# Patient Record
Sex: Female | Born: 1940
Health system: Southern US, Community
[De-identification: ages and names within clinical notes are randomized; demographics above are authoritative.]

## PROBLEM LIST (undated history)

## (undated) DIAGNOSIS — M810 Age-related osteoporosis without current pathological fracture: Secondary | ICD-10-CM

## (undated) DIAGNOSIS — E785 Hyperlipidemia, unspecified: Secondary | ICD-10-CM

## (undated) DIAGNOSIS — I10 Essential (primary) hypertension: Secondary | ICD-10-CM

## (undated) DIAGNOSIS — H539 Unspecified visual disturbance: Secondary | ICD-10-CM

## (undated) DIAGNOSIS — K219 Gastro-esophageal reflux disease without esophagitis: Secondary | ICD-10-CM

## (undated) DIAGNOSIS — R519 Headache, unspecified: Secondary | ICD-10-CM

## (undated) DIAGNOSIS — R51 Headache: Secondary | ICD-10-CM

## (undated) DIAGNOSIS — K5792 Diverticulitis of intestine, part unspecified, without perforation or abscess without bleeding: Secondary | ICD-10-CM

## (undated) DIAGNOSIS — M1990 Unspecified osteoarthritis, unspecified site: Secondary | ICD-10-CM

## (undated) DIAGNOSIS — R9431 Abnormal electrocardiogram [ECG] [EKG]: Secondary | ICD-10-CM

## (undated) HISTORY — DX: Abnormal electrocardiogram (ECG) (EKG): R94.31

## (undated) HISTORY — PX: COLON SURGERY: SHX602

## (undated) HISTORY — PX: EYE SURGERY: SHX253

## (undated) HISTORY — PX: TONSILLECTOMY: SUR1361

## (undated) HISTORY — DX: Hyperlipidemia, unspecified: E78.5

## (undated) HISTORY — PX: SHOULDER ARTHROSCOPY: SHX128

## (undated) HISTORY — DX: Essential (primary) hypertension: I10

## (undated) HISTORY — DX: Gastro-esophageal reflux disease without esophagitis: K21.9

## (undated) HISTORY — PX: APPENDECTOMY: SHX54

## (undated) HISTORY — DX: Unspecified visual disturbance: H53.9

## (undated) HISTORY — DX: Age-related osteoporosis without current pathological fracture: M81.0

---

## 1998-05-28 ENCOUNTER — Emergency Department (HOSPITAL_COMMUNITY): Admission: EM | Admit: 1998-05-28 | Discharge: 1998-05-28 | Payer: Self-pay | Admitting: Internal Medicine

## 2001-02-05 ENCOUNTER — Ambulatory Visit (HOSPITAL_BASED_OUTPATIENT_CLINIC_OR_DEPARTMENT_OTHER): Admission: RE | Admit: 2001-02-05 | Discharge: 2001-02-05 | Payer: Self-pay | Admitting: Orthopedic Surgery

## 2001-05-08 ENCOUNTER — Other Ambulatory Visit: Admission: RE | Admit: 2001-05-08 | Discharge: 2001-05-08 | Payer: Self-pay | Admitting: *Deleted

## 2001-09-27 ENCOUNTER — Encounter (INDEPENDENT_AMBULATORY_CARE_PROVIDER_SITE_OTHER): Payer: Self-pay | Admitting: Specialist

## 2001-09-27 ENCOUNTER — Ambulatory Visit (HOSPITAL_COMMUNITY): Admission: RE | Admit: 2001-09-27 | Discharge: 2001-09-27 | Payer: Self-pay | Admitting: *Deleted

## 2002-05-14 ENCOUNTER — Other Ambulatory Visit: Admission: RE | Admit: 2002-05-14 | Discharge: 2002-05-14 | Payer: Self-pay | Admitting: *Deleted

## 2003-07-22 ENCOUNTER — Other Ambulatory Visit: Admission: RE | Admit: 2003-07-22 | Discharge: 2003-07-22 | Payer: Self-pay | Admitting: *Deleted

## 2004-04-19 ENCOUNTER — Emergency Department (HOSPITAL_COMMUNITY): Admission: EM | Admit: 2004-04-19 | Discharge: 2004-04-19 | Payer: Self-pay | Admitting: Emergency Medicine

## 2004-05-01 ENCOUNTER — Encounter: Admission: RE | Admit: 2004-05-01 | Discharge: 2004-05-01 | Payer: Self-pay | Admitting: Neurology

## 2007-11-08 ENCOUNTER — Encounter (INDEPENDENT_AMBULATORY_CARE_PROVIDER_SITE_OTHER): Payer: Self-pay | Admitting: *Deleted

## 2007-11-08 ENCOUNTER — Ambulatory Visit (HOSPITAL_COMMUNITY): Admission: RE | Admit: 2007-11-08 | Discharge: 2007-11-08 | Payer: Self-pay | Admitting: *Deleted

## 2007-12-04 ENCOUNTER — Encounter (INDEPENDENT_AMBULATORY_CARE_PROVIDER_SITE_OTHER): Payer: Self-pay | Admitting: *Deleted

## 2007-12-04 ENCOUNTER — Ambulatory Visit (HOSPITAL_COMMUNITY): Admission: RE | Admit: 2007-12-04 | Discharge: 2007-12-04 | Payer: Self-pay | Admitting: *Deleted

## 2008-01-01 ENCOUNTER — Ambulatory Visit (HOSPITAL_COMMUNITY): Admission: RE | Admit: 2008-01-01 | Discharge: 2008-01-01 | Payer: Self-pay | Admitting: *Deleted

## 2008-01-01 ENCOUNTER — Encounter (INDEPENDENT_AMBULATORY_CARE_PROVIDER_SITE_OTHER): Payer: Self-pay | Admitting: *Deleted

## 2008-04-29 ENCOUNTER — Encounter: Admission: RE | Admit: 2008-04-29 | Discharge: 2008-04-29 | Payer: Self-pay | Admitting: Internal Medicine

## 2008-09-29 ENCOUNTER — Encounter (INDEPENDENT_AMBULATORY_CARE_PROVIDER_SITE_OTHER): Payer: Self-pay | Admitting: *Deleted

## 2008-09-29 ENCOUNTER — Ambulatory Visit (HOSPITAL_COMMUNITY): Admission: RE | Admit: 2008-09-29 | Discharge: 2008-09-29 | Payer: Self-pay | Admitting: *Deleted

## 2008-10-31 ENCOUNTER — Encounter (INDEPENDENT_AMBULATORY_CARE_PROVIDER_SITE_OTHER): Payer: Self-pay | Admitting: General Surgery

## 2008-10-31 ENCOUNTER — Inpatient Hospital Stay (HOSPITAL_COMMUNITY): Admission: RE | Admit: 2008-10-31 | Discharge: 2008-11-05 | Payer: Self-pay | Admitting: General Surgery

## 2009-11-15 ENCOUNTER — Encounter: Admission: RE | Admit: 2009-11-15 | Discharge: 2009-11-15 | Payer: Self-pay | Admitting: Specialist

## 2010-06-14 ENCOUNTER — Other Ambulatory Visit: Payer: Self-pay | Admitting: Registered Nurse

## 2010-06-14 ENCOUNTER — Other Ambulatory Visit (HOSPITAL_COMMUNITY)
Admission: RE | Admit: 2010-06-14 | Discharge: 2010-06-14 | Disposition: A | Discharge status: Home / Self Care | Payer: Medicare Other | Source: Ambulatory Visit | Attending: Internal Medicine | Admitting: Internal Medicine

## 2010-06-14 DIAGNOSIS — Z01419 Encounter for gynecological examination (general) (routine) without abnormal findings: Secondary | ICD-10-CM | POA: Insufficient documentation

## 2010-06-19 LAB — CBC
MCHC: 34.1 g/dL (ref 30.0–36.0)
MCV: 90.5 fL (ref 78.0–100.0)
Platelets: 174 10*3/uL (ref 150–400)
RBC: 4.21 MIL/uL (ref 3.87–5.11)
RBC: 4.82 MIL/uL (ref 3.87–5.11)
RDW: 13.8 % (ref 11.5–15.5)
WBC: 8.2 10*3/uL (ref 4.0–10.5)

## 2010-06-19 LAB — DIFFERENTIAL
Eosinophils Absolute: 0.5 10*3/uL (ref 0.0–0.7)
Eosinophils Relative: 6 % — ABNORMAL HIGH (ref 0–5)
Lymphs Abs: 1.6 10*3/uL (ref 0.7–4.0)
Monocytes Absolute: 0.7 10*3/uL (ref 0.1–1.0)

## 2010-06-19 LAB — URINALYSIS, ROUTINE W REFLEX MICROSCOPIC
Nitrite: NEGATIVE
Specific Gravity, Urine: 1.005 (ref 1.005–1.030)
Urobilinogen, UA: 0.2 mg/dL (ref 0.0–1.0)

## 2010-06-19 LAB — BASIC METABOLIC PANEL
BUN: 5 mg/dL — ABNORMAL LOW (ref 6–23)
CO2: 29 mEq/L (ref 19–32)
Calcium: 8.4 mg/dL (ref 8.4–10.5)
Creatinine, Ser: 0.88 mg/dL (ref 0.4–1.2)
GFR calc Af Amer: >60 mL/min (ref >60)

## 2010-06-19 LAB — COMPREHENSIVE METABOLIC PANEL
ALT: 21 U/L (ref 0–35)
AST: 21 U/L (ref 0–37)
Albumin: 4 g/dL (ref 3.5–5.2)
CO2: 30 mEq/L (ref 19–32)
Calcium: 9.8 mg/dL (ref 8.4–10.5)
Chloride: 103 mEq/L (ref 96–112)
GFR calc Af Amer: 52 mL/min — ABNORMAL LOW (ref >60)
GFR calc non Af Amer: 43 mL/min — ABNORMAL LOW (ref >60)
Sodium: 140 mEq/L (ref 135–145)
Total Bilirubin: 0.7 mg/dL (ref 0.3–1.2)

## 2010-07-27 NOTE — Op Note (Signed)
[name], [name]              ACCOUNT NO.:  [id number]   MEDICAL RECORD NO.:  [id number]          PATIENT TYPE:  AMB   LOCATION:  ENDO                         FACILITY:  [facility]   PHYSICIAN:  Georgiana Spinner, M.D.    DATE OF BIRTH:  [birth year]   DATE OF PROCEDURE:  DATE OF DISCHARGE:                               OPERATIVE REPORT   PROCEDURE:  Colonoscopy with biopsy.   INDICATIONS:  Previous colon polyp in the cecum that was removed and  eradicated with the ERBE argon photocoagulator.  This was a look back to  assess for any residual polyp tissue.   ANESTHESIA:  Fentanyl 100 mcg and Versed 10 mg.   PROCEDURE:  With the patient mildly sedated in the left lateral  decubitus position, the Pentax videoscopic colonoscope was inserted in  the rectum and passed under direct vision to the sigmoid colon which was  very tortuous and I could not pass the scope past this point, therefore  it was withdrawn and the Pentax videoscopic pediatric colonoscope was  inserted into the rectum and passed again through a very tortuous tight  sigmoid colon to reach the cecum with pressure applied to the patient's  abdomen.  The patient rolled to her back and subsequently to her right  side.  Cecum was identified by ileocecal valve and appendiceal orifice,  both of which were photographed.  In the cecum was a small ulcer  approximately 5 mm in size with a clean white base surrounding  erythematous tissue that did not look polypoid to me, but I elected to  biopsy this cold to make sure there was not microscopic recurrence.  There was a small amount of bleeding which I elected to inject with  epinephrine 2 mL.  Also in the cecum near this was a small flat polyp  that was photographed and removed using hot biopsy forceps technique,  setting of 20/150 blended current.  Once I was satisfied that I had a  good look at the cecum and I felt there was probably no residual polyp  at the site of the ulcer, small  new polyp seen.  The colonoscope was  then slowly withdrawn taking circumferential views of the colonic  mucosa.  The prep was good.  We stopped only in the rectum where another  small polyp was seen.  It was flat and photographed and removed using  again hot biopsy forceps technique setting of 20/150 blended current.  The endoscope was withdrawn.  I elected not to do retroflexed view again  at this point.  The patient's vital signs and pulse oximeter remained  stable.  The patient tolerated the procedure well without apparent long-  term complication other than bleeding from the biopsy site, which was  controlled with epinephrine.   IMPRESSION:  Probably no recurrence of polyp at previous polypectomy  site, small ulcer remains with surrounding erythema that was friable and  bled with biopsy.  This was again injected to stop bleeding and two  small polyps were seen, one near this in the cecum and clearly to be  benign and probably 4  mm in size, and a second polyp in the rectum which  was all approximately 4 mm in size, flat and benign appearing, also  removed with hot biopsy forceps technique.   PLAN:  Await biopsies.  The patient will call me for results and follow-  up with me as needed as an outpatient.           ______________________________  Georgiana Spinner, M.D.    GMO/MEDQ  D:  01/01/2008  T:  01/01/2008  Job:  [id number]

## 2010-07-27 NOTE — Op Note (Signed)
NAME:  [name], ENNEKING              ACCOUNT NO.:  [id number]   MEDICAL RECORD NO.:  [id number]          PATIENT TYPE:  AMB   LOCATION:  ENDO                         FACILITY:  [facility]   PHYSICIAN:  Georgiana Spinner, M.D.    DATE OF BIRTH:  [birth year]   DATE OF PROCEDURE:  12/04/2007  DATE OF DISCHARGE:                               OPERATIVE REPORT   PROCEDURE:  Colonoscopy with polypectomy and eradication of tumor.   ANESTHESIA:  Fentanyl 100 mcg, Versed 7 mg.   INDICATIONS:  The patient had a previous polyp in the cecum that I had  to remove and eradicate with argon photocoagulation 4 weeks ago.  This  was for follow-up to make sure there was adequate eradication.  Subsequently the Pentax videoscopic pediatric colonoscope was inserted  in the rectum, passed under direct vision through a diverticular filled  tortuous sigmoid colon to subsequently reach the cecum identified by  ileocecal valve and appendiceal orifice, the latter of which was  photographed.  In the cecum was once again seen a polyp.  This was  photographed and it was removed in two pieces by snare cautery technique  setting of 20/150 blended current.  With that I felt that I probably had  removed the full tumor but because of the regrowth, I elected to use the  Erbe argon photocoagulator to coagulate around the polypectomy site  which I did.  Photographs taken.  Once this was completed, the endoscope  was withdrawn taking circumferential views of the remaining colonic  mucosa stopping only in the rectum and withdrawing the endoscope.  The  patient's vital signs, pulse oximeter remained stable.  The patient  tolerated the procedure well without apparent complications.   FINDINGS:  Polyp of cecum removed and eradicated once again.  Diverticulosis of sigmoid colon moderately severe.   PLAN:  Await biopsy report of the tissue retrieved.           ______________________________  Georgiana Spinner, M.D.     GMO/MEDQ  D:   12/04/2007  T:  12/05/2007  Job:  [id number]

## 2010-07-27 NOTE — Op Note (Signed)
[name], PLANCK              ACCOUNT NO.:  [id number]   MEDICAL RECORD NO.:  [id number]          PATIENT TYPE:  INP   LOCATION:  0001                         FACILITY:  [facility]   PHYSICIAN:  Angelia Mould. Derrell Lolling, M.D.DATE OF BIRTH:  13-Oct-1940   DATE OF PROCEDURE:  10/31/2008  DATE OF DISCHARGE:                               OPERATIVE REPORT   PREOPERATIVE DIAGNOSIS:  Recurrent villous adenoma of the cecum.   POSTOPERATIVE DIAGNOSIS:  Recurrent villous adenoma of the cecum.   OPERATION PERFORMED:  Laparoscopic-assisted right colectomy.   SURGEON:  Dr. Claud Kelp.   FIRST ASSISTANT:  Dr. Chevis Pretty.   OPERATIVE INDICATIONS:  This is a relatively healthy 70 year old  Caucasian female with recurrent villous adenoma of the cecum.  One year  ago she developed abdominal cramps, loose stools and blood and mucus in  the stools.  Dr. Virginia Rochester perform three separate colonoscopies to resect a  villous adenoma of the cecum.  After her procedure he had everything  resected and the patient's symptoms resolved.  All the pathology reports  showed villous adenoma, but no evidence of high-grade dysplasia.  She  had about 8 months without any symptoms and then the cramping and mucus  and blood recurred.  She had a colonoscopy on September 29, 2008.  He found a  tortuous sigmoid colon, but the only lesions that were found were in the  cecum where a recurrent villous adenoma was present.  He biopsied this  and that showed villous adenoma but no dysplasia,.  She was referred for  resection.  She is been counseled as an outpatient.  She has undergone a  bowel prep at home.  She is brought to operating room electively.   OPERATIVE FINDINGS:  The patient had a reasonable bowel prep, although  there was a little bit of liquid sticky stool in the right colon.  There  was a villous adenoma in the cecum.  It was very localized and probably  about 1.5 to 2 cm in size at most.  There was no sign of any invasive  cancer anywhere.  We resected about 5 inches of terminal ileum and  resected the right colon up to just beyond the hepatic flexure.   OPERATIVE TECHNIQUE:  Following induction of general endotracheal  anesthesia intravenous antibiotics were given.  A Foley catheter was  inserted.  The patient's abdomen was prepped and draped in sterile  fashion.  The patient was identified as the correct patient and correct  procedure.  A 0.5% Marcaine with epinephrine was used as a local  infiltration anesthetic.  A vertical incision was made at the superior  rim of the umbilicus.  The fascia was incised in the midline and the  abdominal cavity entered under direct vision.  An 11-mm Hassan trocar  was inserted and secured with a pursestring suture of 0-Vicryl.  Pneumoperitoneum was created.  Video cam was inserted with visualization  of findings as described above.  A 5-mm trocar placed was placed in the  lower midline, a 5-mm trocar was placed in the left midabdomen and a 5-  mm trocar  in the left upper quadrant.   Using the harmonic scalpel and blunt dissection, we divided the lateral  peritoneal attachments, mobilizing the terminal ileum, cecum and right  colon and hepatic flexure.  We did this slowly in small steps until we  had the cecum and right colon mobilized all the way to the midline.  We  did visualize the duodenum.  We repositioned the patient and took down  the hepatic flexure somewhat.  Once we had everything completely  mobilized to the midline we irrigated the area and found no bleeding.   We released the pneumoperitoneum.  We made a short incision that  extended from just below the umbilicus around to the right and just  above the umbilicus incorporating the trocar site.  The fascia was  incised in the midline.  We opened the fascia about 7 cm or so.  We  inserted a protractor wound protector.  We delivered the terminal ileum,  cecum and right colon into the wound.  There were a  couple of adhesions  that we took down and then we could see the terminal ileum, the right  colon and the hepatic flexure coming out into the wound.  We also had  some of the transverse colon out of the wound.  We did not palpate any  abnormality.  We transected the terminal ileum using a GIA stapling  device.  We transected just distal to the hepatic flexure with a GIA  stapling device.  Mesenteric vessels were controlled either with the  LigaSure device or larger ones were clamped, divided and ligated with 2-  0 silk ties.   I took the specimen to the side table and opened it up completely.  I  then visualized the localized villous adenoma as described above.  I was  satisfied that we had identified the lesion.   I changed my gown and gloves.  There was no bleeding.  Anastomosis was  created between the terminal ileum and the right transverse colon using  a GIA stapling device.  The defect in the bowel wall was closed with TA-  60 stapling device.  A few extra sutures of 3-0 silk were placed to  reinforce the staple line at critical points.  The bowel was pink and  healthy and bled easily and the anastomosis looked watertight.  We  changed our gloves and instruments one more time.  The mesentery was  closed with interrupted sutures of 2-0 and 3-0 silk.  We irrigated out  the tissues and then dropped the colon back into the abdominal cavity.  We irrigated the abdominal cavity with about 2 L of saline.  The  irrigation fluid became clear.  We closed the midline fascia with a  running suture of #1 PDS and skin staples.  We reinsufflated the  abdomen.  We inserted the 5 mm scope and looked around the pelvis in the  upper abdomen and subphrenic spaces.  We irrigated the subphrenic space  and pelvis just a little bit to get all the fluid out.  We looked at the  anastomosis and there did not seem to be any bleeding.  We then removed  all the trocars under direct vision.  There was no  bleeding from trocar  sites.  Pneumoperitoneum was released.  The trocar sites were closed  with Steri-Strips.  Clean bandages were placed and the patient was taken  to recovery in stable condition.   ESTIMATED BLOOD LOSS:  About 75 mL.  COMPLICATIONS:  None.   COUNTS:  Sponge, needle and instruments counts were correct.      Angelia Mould. Derrell Lolling, M.D.  Electronically Signed     HMI/MEDQ  D:  10/31/2008  T:  10/31/2008  Job:  [id number]   cc:   Soyla Murphy. Renne Crigler, M.D.  Fax: [phone]   Georgiana Spinner, M.D.  Fax: [phone]

## 2010-07-27 NOTE — Op Note (Signed)
[name], [name]              ACCOUNT NO.:  [id number]   MEDICAL RECORD NO.:  [id number]          PATIENT TYPE:  AMB   LOCATION:  ENDO                         FACILITY:  [facility]   PHYSICIAN:  Georgiana Spinner, M.D.    DATE OF BIRTH:  1940-08-20   DATE OF PROCEDURE:  09/29/2008  DATE OF DISCHARGE:                               OPERATIVE REPORT   PROCEDURE:  Colonoscopy.   INDICATIONS:  Rectal bleeding, colon polyps.   ANESTHESIA:  Fentanyl 100 mcg, Versed 10 mg.   PROCEDURE:  With the patient mildly sedated in the left lateral  decubitus position, the Pentax videoscopic pediatric colonoscope was  inserted in the rectum and passed through a tortuous sigmoid colon that  was tight, and with pressure applied, we were able to reach the cecum  identified by ileocecal valve and appendiceal orifice.  In the cecum was  recurrent polyp that I previously removed and eradicated with the ERBE  argon photocoagulator from about a year ago and had had follow-up  colonoscopy which showed that it was negative.  We did biopsies at this  time and photographed.  The area was in the cecum.  From this point, the  colonoscope was slowly withdrawn taking circumferential views of the  remaining colonic mucosa, stopping only in the rectum which appeared  normal on direct, and I could not retroflex at this point.  The  endoscope was then withdrawn.  The patient's vital signs and pulse  oximeter remained stable.  The patient tolerated the procedure well  without apparent complication.   FINDINGS:  Polyp of her cecum, biopsied.   PLAN:  I think with recurrence in 1 year to this extent that this  patient should have a cecectomy done, and I am going to suggest a  referral to surgery to that end.  The patient had what I thought was a  good resection a year ago only to recur within a year's time.           ______________________________  Georgiana Spinner, M.D.     GMO/MEDQ  D:  09/29/2008  T:  09/29/2008   Job:  [id number]

## 2010-07-27 NOTE — Op Note (Signed)
[name], [name]              ACCOUNT NO.:  [id number]   MEDICAL RECORD NO.:  [id number]          PATIENT TYPE:  AMB   LOCATION:  ENDO                         FACILITY:  [facility]   PHYSICIAN:  Georgiana Spinner, M.D.    DATE OF BIRTH:  [birth year]   DATE OF PROCEDURE:  11/08/2007  DATE OF DISCHARGE:                               OPERATIVE REPORT   PROCEDURE:  Colonoscopy with polypectomy.  The eradication of tumor  biopsy.   PROCEDURE INDICATIONS:  Rectal bleeding, diarrhea.   ANESTHESIA:  Fentanyl 125 mcg, Versed 11 mg.   PROCEDURE:  With the patient mildly sedated in the left lateral  decubitus position, the Pentax videoscopic colonoscope was inserted in  the rectum and passed under direct vision to the sigmoid but no further.  and the endoscope had to be removed subsequently.  The Pentax  videoscopic pediatric colonoscope was inserted in the rectum and passed  under direct vision with pressure applied to reach the cecum, identified  by ileocecal valve and appendiceal orifice, the latter of which was  photographed, first, and, in the cecum, was a large polyp that was  photographed, and we subsequently removed using it snare cautery  technique and subsequently ERBE argon photocoagulator to eradicate the  remainder of the polyp I could.  I believe I got all the tissue, but  some on the far side of the polyp may not have come off, but I could not  see any at the termination of the procedure. and I felt that this was a  good first start at this point.  So subsequently from this point, the  colonoscope was then slowly withdrawn taking circumferential views of  the remaining colonic mucosa, stopping in the sigmoid colon where  inflammatory changes were seen, photographed, and biopsied until we  reached the rectum which appeared normal on direct and showed  hemorrhoids on retroflexed view.  The endoscope was straightened and  withdrawn.  The patient's vital signs, pulse oximeter remained  stable.  The patient tolerated the procedure well without apparent complications.   FINDINGS:  Large polyp of cecum removed by snare cautery technique and  ERBE argon photocoagulation, internal hemorrhoids and inflammatory  changes of sigmoid and near diverticulosis changes.   PLAN:  Await biopsy report.  The patient will call me for results and  follow up with me as an outpatient.           ______________________________  Georgiana Spinner, M.D.     GMO/MEDQ  D:  11/08/2007  T:  11/08/2007  Job:  [id number]

## 2010-07-30 NOTE — Procedures (Signed)
[facility]  Patient:    [name], [name] Visit Number: [id number] MRN: [id number]          Service Type: END Location: ENDO Attending Physician:  Sabino Gasser Dictated by:   Sabino Gasser, M.D. Proc. Date: 09/27/01 Admit Date:  09/27/2001 Discharge Date: 09/27/2001                             Procedure Report  PROCEDURE:  Upper endoscopy.  INDICATIONS:  GERD.  ANESTHESIA:  Demerol 50 mg, Versed 5 mg.  DESCRIPTION OF PROCEDURE:  With the patient mildly sedated in the left lateral decubitus position, the Olympus videoscopic endoscope inserted in the mouth and passed under direct vision through the esophagus (which appeared normal). Then into the stomach, fundus, antrum and body which was visualized.  In the antrum erythema was seen, photographed and biopsied.  We then entered into duodenal bulb and second portion of duodenum -- appeared normal.  From this point the endoscope was slowly withdrawn, taking circumferential views of the duodenal mucosa, until the endoscope had been pulled back and the stomach placed in retroflexion to view the stomach from below.  The  endoscope was then straightened and withdrawn, taking circumferential views of the remaining gastric and esophageal mucosa, stopping in the fundus. There a small polyp was seen and photographed, and a biopsy taken.  The remainder of the gastric and esophageal mucosa appeared unremarkable. The patients vital signs and pulse oximetry remained stable.  The patient tolerated the procedure well without apparent complications.  FINDINGS: 1. Antral erythema, biopsied. 2. Fundic polyp, biopsied.  PLAN:  Await biopsy report.  The patient will call me for results and follow-up with me as an outpatient.  Proceed to colonoscopy as planned. Dictated by:   Sabino Gasser, M.D. Attending Physician:  Sabino Gasser DD:  09/27/01 TD:  10/01/01 Job: [id number]

## 2010-07-30 NOTE — Op Note (Signed)
[name]  Patient:    [name], [name] Visit Number: [id number] MRN: [id number]          Service Type: DSU Location: [facility] Attending Physician:  Twana First Dictated by:   Elana Alm Thurston Hole, M.D. Proc. Date: 02/05/01 Admit Date:  02/05/2001                             Operative Report  PREOPERATIVE DIAGNOSES: 1. Left shoulder superior labrum anterior and posterior tear. 2. Left shoulder partial rotator cuff tear. 3. Left shoulder paralabral cyst with impingement.  POSTOPERATIVE DIAGNOSES: 1. Left shoulder superior labrum anterior and posterior tear. 2. Left shoulder partial rotator cuff tear. 3. Left shoulder paralabral cyst with impingement.  PROCEDURES: 1. Left shoulder examination under anesthesia followed by arthroscopic    superior labrum anterior and posterior tear debridement. 2. Left shoulder subacromial decompression. 3. Left shoulder paralabral cyst aspiration and cortisone injection.  SURGEON:  Elana Alm. Thurston Hole, M.D.  ASSISTANT:  Julien Girt, P.A.  ANESTHESIA:  General.  OPERATIVE TIME:  45 minutes.  COMPLICATIONS:  None.  INDICATION FOR PROCEDURE:  [name] a 70 year old woman who has had significant left shoulder pain for the past six to eight months, increasing in nature, with skin and subcutaneous tissue and MRI documenting an SLAP tear with impingement and a paralabral cyst.  She is now to undergo arthroscopy with either cyst excision versus aspiration.  DESCRIPTION OF PROCEDURE:  [name] was brought to the operating room on February 05, 2001, after a block had been placedin the holding room.  She was placed on the operating table in the supine position.  After being placed under general anesthesia, her left shoulder was examined under anesthesia. She had full range of motion, and her shoulder was stable to ligamentous exam.  Using fluoroscopic control and an 18-gauge spinal needle, the  paralabral cyst was aspirated through the superior aspect of the shoulder under fluoroscopic control, and gelatinous cystic material was removed, approximately 3-4 cc, and a combination of1 cc of 40 mg of Depo-Medrol and 1 cc of 0.25% Marcaine was placed in the cyst area.  After this was done, the shoulder and arm were prepped using sterile Betadine and draped using sterile technique.  Originally the arthroscopy was performed through a posterior arthroscopic portal, and the arthroscope with the pump attached was placed into an anterior portal and the arthroscopic probe was placed.  On initial inspection the articular cartilage ad the glenohumeral joint were intact.  The anterior labrum superiorly showed partial tearing.  This was partially debrided.  The biceps tendon anchor was hypermobile but did not appear to be traumatically torn, and thus did not feel that a repair of this hypermobile biceps tendon anchor was necessary.  The debridement of the SLAP tear was carried out 25-30%.  The posterior labrum was intact.  The biceps tendon itself was extremely diminutive and actually adherent and almost noted to be part of the superior and medial aspect of the supraspinatus.  I attempted first to tease this apart; however, I felt that this would cause actually more damage to the shoulder area than to leave it as such contiguous with the rotatorcuff itself.  The rotator cuff had partial tearing, 25-30% of the supraspinatus.  This was debrided.  The rest of the cuff was found to be intactwith good attachment points to the greater tuberosity and lesser tuberosity.  The inferior capsular recess was  free of pathology.  After this wasdone, the subacromial space was entered and a lateral arthroscopic portal was made.  A large amount of bursitis was resected.  Underneath this the rotator cuff was found to be intact. Subacromial decompression was carried out, removing 6-8 mm of the undersurface of the  anterior, anterior lateral, and anterior medial acromion, and a CA ligament release carried out.  The [facility] joint was not disturbed.  After this was done, the shoulder could be brought through a full range of motion with no impingement of therotator cuff.  At this point it was felt that all pathology had been satisfactorily addressed.  The arthroscopic portals were closed with 3-0 nylon suture, injected with 0.25% Marcaine.  Sterile dressings and a sling applied and the patient awakened and taken to the recovery room in stable condition.  FOLLOW-UP CARE:  [name] will be followed as an outpatient on Vicodin and on [place] NX for pain.  Begin early physical therapy.  See her back in the office in a week for sutures out and follow-up. Dictated by:   Elana Alm Thurston Hole, M.D. Attending Physician: Twana First DD:  02/05/09 TD:  02/05/01 Job: [id number]

## 2010-07-30 NOTE — Procedures (Signed)
[facility]  Patient:    [name], [name] Visit Number: [id number] MRN: [id number]          Service Type: END Location: ENDO Attending Physician:  Sabino Gasser Dictated by:   Sabino Gasser, M.D. Proc. Date: 09/27/01 Admit Date:  09/27/2001 Discharge Date: 09/27/2001                             Procedure Report  PROCEDURE:  Colonoscopy.  INDICATIONS:  Colon cancer screening.  ANESTHESIA:  Demerol 20, Versed 2 mg additionally.  DESCRIPTION OF PROCEDURE:  With the patient mildly sedated in the left lateral decubitus position, the Olympus videoscopic colonoscope was inserted into the rectum and passed under direct vision through a tortuous, very tight sigmoid colon to the cecum.  Cecum identified by the ileocecal valve and appendiceal orifice, both of which were photographed.  The prep was good.  From this point, the colonoscope was slowly withdrawn, taking circumferential views of the entire colonic mucosa, stopping only in the rectum which appeared normal on direct and retroflexed views.  The endoscope was straightened and withdrawn.  The patients vital signs and pulse oximeter remained stable.  The patient tolerated the procedure well without apparent complications.  FINDINGS:  Diverticulosis with tortuosity of the sigmoid colon.  Otherwise an unremarkable examination.  PLAN:  See endoscopy note for further details. Dictated by:   Sabino Gasser, M.D. Attending Physician:  Sabino Gasser DD:  09/27/01 TD:  10/01/01 Job: [id number]

## 2010-12-17 ENCOUNTER — Other Ambulatory Visit: Payer: Self-pay | Admitting: Internal Medicine

## 2010-12-17 DIAGNOSIS — Z1231 Encounter for screening mammogram for malignant neoplasm of breast: Secondary | ICD-10-CM

## 2010-12-30 ENCOUNTER — Other Ambulatory Visit: Payer: Self-pay | Admitting: Internal Medicine

## 2010-12-30 ENCOUNTER — Ambulatory Visit
Admission: RE | Admit: 2010-12-30 | Discharge: 2010-12-30 | Disposition: A | Discharge status: Home / Self Care | Payer: Medicare Other | Source: Ambulatory Visit | Attending: Internal Medicine | Admitting: Internal Medicine

## 2010-12-30 DIAGNOSIS — Z1231 Encounter for screening mammogram for malignant neoplasm of breast: Secondary | ICD-10-CM

## 2010-12-30 DIAGNOSIS — R928 Other abnormal and inconclusive findings on diagnostic imaging of breast: Secondary | ICD-10-CM

## 2011-01-14 ENCOUNTER — Other Ambulatory Visit: Payer: Self-pay | Admitting: Internal Medicine

## 2011-01-14 ENCOUNTER — Ambulatory Visit
Admission: RE | Admit: 2011-01-14 | Discharge: 2011-01-14 | Disposition: A | Discharge status: Home / Self Care | Payer: Medicare Other | Source: Ambulatory Visit | Attending: Internal Medicine | Admitting: Internal Medicine

## 2011-01-14 DIAGNOSIS — N63 Unspecified lump in unspecified breast: Secondary | ICD-10-CM

## 2011-01-14 DIAGNOSIS — R928 Other abnormal and inconclusive findings on diagnostic imaging of breast: Secondary | ICD-10-CM

## 2011-03-02 ENCOUNTER — Ambulatory Visit
Admission: RE | Admit: 2011-03-02 | Discharge: 2011-03-02 | Disposition: A | Discharge status: Home / Self Care | Payer: Medicare Other | Source: Ambulatory Visit | Attending: Internal Medicine | Admitting: Internal Medicine

## 2011-03-02 ENCOUNTER — Other Ambulatory Visit: Payer: Self-pay | Admitting: Internal Medicine

## 2011-03-02 DIAGNOSIS — S0990XA Unspecified injury of head, initial encounter: Secondary | ICD-10-CM

## 2011-06-06 DIAGNOSIS — H6120 Impacted cerumen, unspecified ear: Secondary | ICD-10-CM | POA: Diagnosis not present

## 2011-06-06 DIAGNOSIS — H902 Conductive hearing loss, unspecified: Secondary | ICD-10-CM | POA: Diagnosis not present

## 2011-06-28 DIAGNOSIS — K219 Gastro-esophageal reflux disease without esophagitis: Secondary | ICD-10-CM | POA: Diagnosis not present

## 2011-06-28 DIAGNOSIS — I10 Essential (primary) hypertension: Secondary | ICD-10-CM | POA: Diagnosis not present

## 2011-06-28 DIAGNOSIS — Z79899 Other long term (current) drug therapy: Secondary | ICD-10-CM | POA: Diagnosis not present

## 2011-07-01 DIAGNOSIS — G43909 Migraine, unspecified, not intractable, without status migrainosus: Secondary | ICD-10-CM | POA: Diagnosis not present

## 2011-07-01 DIAGNOSIS — E7800 Pure hypercholesterolemia, unspecified: Secondary | ICD-10-CM | POA: Diagnosis not present

## 2011-07-01 DIAGNOSIS — J309 Allergic rhinitis, unspecified: Secondary | ICD-10-CM | POA: Diagnosis not present

## 2011-07-01 DIAGNOSIS — M899 Disorder of bone, unspecified: Secondary | ICD-10-CM | POA: Diagnosis not present

## 2011-07-01 DIAGNOSIS — M949 Disorder of cartilage, unspecified: Secondary | ICD-10-CM | POA: Diagnosis not present

## 2011-08-23 DIAGNOSIS — G43909 Migraine, unspecified, not intractable, without status migrainosus: Secondary | ICD-10-CM | POA: Diagnosis not present

## 2011-08-23 DIAGNOSIS — B399 Histoplasmosis, unspecified: Secondary | ICD-10-CM | POA: Diagnosis not present

## 2011-08-23 DIAGNOSIS — H33009 Unspecified retinal detachment with retinal break, unspecified eye: Secondary | ICD-10-CM | POA: Diagnosis not present

## 2011-08-23 DIAGNOSIS — H32 Chorioretinal disorders in diseases classified elsewhere: Secondary | ICD-10-CM | POA: Diagnosis not present

## 2011-12-19 ENCOUNTER — Other Ambulatory Visit: Payer: Self-pay | Admitting: Internal Medicine

## 2011-12-19 DIAGNOSIS — Z1231 Encounter for screening mammogram for malignant neoplasm of breast: Secondary | ICD-10-CM

## 2011-12-19 DIAGNOSIS — Z803 Family history of malignant neoplasm of breast: Secondary | ICD-10-CM

## 2011-12-26 DIAGNOSIS — H1089 Other conjunctivitis: Secondary | ICD-10-CM | POA: Diagnosis not present

## 2011-12-26 DIAGNOSIS — Z23 Encounter for immunization: Secondary | ICD-10-CM | POA: Diagnosis not present

## 2012-01-10 DIAGNOSIS — H6120 Impacted cerumen, unspecified ear: Secondary | ICD-10-CM | POA: Diagnosis not present

## 2012-01-10 DIAGNOSIS — H902 Conductive hearing loss, unspecified: Secondary | ICD-10-CM | POA: Diagnosis not present

## 2012-01-17 ENCOUNTER — Ambulatory Visit
Admission: RE | Admit: 2012-01-17 | Discharge: 2012-01-17 | Disposition: A | Discharge status: Home / Self Care | Payer: Medicare Other | Source: Ambulatory Visit | Attending: Internal Medicine | Admitting: Internal Medicine

## 2012-01-17 DIAGNOSIS — Z1231 Encounter for screening mammogram for malignant neoplasm of breast: Secondary | ICD-10-CM

## 2012-01-17 DIAGNOSIS — Z803 Family history of malignant neoplasm of breast: Secondary | ICD-10-CM

## 2012-06-05 DIAGNOSIS — H33009 Unspecified retinal detachment with retinal break, unspecified eye: Secondary | ICD-10-CM | POA: Diagnosis not present

## 2012-06-05 DIAGNOSIS — H32 Chorioretinal disorders in diseases classified elsewhere: Secondary | ICD-10-CM | POA: Diagnosis not present

## 2012-06-05 DIAGNOSIS — B399 Histoplasmosis, unspecified: Secondary | ICD-10-CM | POA: Diagnosis not present

## 2012-07-03 DIAGNOSIS — M899 Disorder of bone, unspecified: Secondary | ICD-10-CM | POA: Diagnosis not present

## 2012-07-03 DIAGNOSIS — I10 Essential (primary) hypertension: Secondary | ICD-10-CM | POA: Diagnosis not present

## 2012-07-03 DIAGNOSIS — Z0000 Encounter for general adult medical examination without abnormal findings: Secondary | ICD-10-CM | POA: Diagnosis not present

## 2012-07-03 DIAGNOSIS — E7800 Pure hypercholesterolemia, unspecified: Secondary | ICD-10-CM | POA: Diagnosis not present

## 2012-07-03 DIAGNOSIS — Z79899 Other long term (current) drug therapy: Secondary | ICD-10-CM | POA: Diagnosis not present

## 2012-07-10 DIAGNOSIS — M159 Polyosteoarthritis, unspecified: Secondary | ICD-10-CM | POA: Diagnosis not present

## 2012-07-10 DIAGNOSIS — E7800 Pure hypercholesterolemia, unspecified: Secondary | ICD-10-CM | POA: Diagnosis not present

## 2012-07-10 DIAGNOSIS — G43909 Migraine, unspecified, not intractable, without status migrainosus: Secondary | ICD-10-CM | POA: Diagnosis not present

## 2012-07-10 DIAGNOSIS — K219 Gastro-esophageal reflux disease without esophagitis: Secondary | ICD-10-CM | POA: Diagnosis not present

## 2012-12-06 DIAGNOSIS — L821 Other seborrheic keratosis: Secondary | ICD-10-CM | POA: Diagnosis not present

## 2012-12-06 DIAGNOSIS — D2370 Other benign neoplasm of skin of unspecified lower limb, including hip: Secondary | ICD-10-CM | POA: Diagnosis not present

## 2012-12-06 DIAGNOSIS — D485 Neoplasm of uncertain behavior of skin: Secondary | ICD-10-CM | POA: Diagnosis not present

## 2012-12-25 ENCOUNTER — Other Ambulatory Visit: Payer: Self-pay

## 2012-12-25 DIAGNOSIS — Z1231 Encounter for screening mammogram for malignant neoplasm of breast: Secondary | ICD-10-CM

## 2012-12-28 DIAGNOSIS — Z23 Encounter for immunization: Secondary | ICD-10-CM | POA: Diagnosis not present

## 2013-01-21 DIAGNOSIS — E2839 Other primary ovarian failure: Secondary | ICD-10-CM | POA: Diagnosis not present

## 2013-01-28 ENCOUNTER — Ambulatory Visit
Admission: RE | Admit: 2013-01-28 | Discharge: 2013-01-28 | Disposition: A | Discharge status: Home / Self Care | Payer: Medicare Other | Source: Ambulatory Visit

## 2013-01-28 ENCOUNTER — Ambulatory Visit: Payer: Medicare Other

## 2013-01-28 DIAGNOSIS — Z1231 Encounter for screening mammogram for malignant neoplasm of breast: Secondary | ICD-10-CM

## 2013-03-15 DIAGNOSIS — G43909 Migraine, unspecified, not intractable, without status migrainosus: Secondary | ICD-10-CM | POA: Diagnosis not present

## 2013-03-15 DIAGNOSIS — H33009 Unspecified retinal detachment with retinal break, unspecified eye: Secondary | ICD-10-CM | POA: Diagnosis not present

## 2013-03-15 DIAGNOSIS — H01009 Unspecified blepharitis unspecified eye, unspecified eyelid: Secondary | ICD-10-CM | POA: Diagnosis not present

## 2013-03-15 DIAGNOSIS — B399 Histoplasmosis, unspecified: Secondary | ICD-10-CM | POA: Diagnosis not present

## 2013-03-25 DIAGNOSIS — H6120 Impacted cerumen, unspecified ear: Secondary | ICD-10-CM | POA: Diagnosis not present

## 2013-03-25 DIAGNOSIS — H902 Conductive hearing loss, unspecified: Secondary | ICD-10-CM | POA: Diagnosis not present

## 2013-03-25 DIAGNOSIS — H608X9 Other otitis externa, unspecified ear: Secondary | ICD-10-CM | POA: Diagnosis not present

## 2013-06-11 DIAGNOSIS — Z86010 Personal history of colonic polyps: Secondary | ICD-10-CM | POA: Diagnosis not present

## 2013-06-11 DIAGNOSIS — Z09 Encounter for follow-up examination after completed treatment for conditions other than malignant neoplasm: Secondary | ICD-10-CM | POA: Diagnosis not present

## 2013-06-11 DIAGNOSIS — K5730 Diverticulosis of large intestine without perforation or abscess without bleeding: Secondary | ICD-10-CM | POA: Diagnosis not present

## 2013-07-18 DIAGNOSIS — Z23 Encounter for immunization: Secondary | ICD-10-CM | POA: Diagnosis not present

## 2013-07-18 DIAGNOSIS — K219 Gastro-esophageal reflux disease without esophagitis: Secondary | ICD-10-CM | POA: Diagnosis not present

## 2013-07-18 DIAGNOSIS — Z0000 Encounter for general adult medical examination without abnormal findings: Secondary | ICD-10-CM | POA: Diagnosis not present

## 2013-07-18 DIAGNOSIS — E7800 Pure hypercholesterolemia, unspecified: Secondary | ICD-10-CM | POA: Diagnosis not present

## 2013-07-18 DIAGNOSIS — I10 Essential (primary) hypertension: Secondary | ICD-10-CM | POA: Diagnosis not present

## 2013-07-23 DIAGNOSIS — R5383 Other fatigue: Secondary | ICD-10-CM | POA: Diagnosis not present

## 2013-07-23 DIAGNOSIS — H608X9 Other otitis externa, unspecified ear: Secondary | ICD-10-CM | POA: Diagnosis not present

## 2013-07-23 DIAGNOSIS — R5381 Other malaise: Secondary | ICD-10-CM | POA: Diagnosis not present

## 2013-07-23 DIAGNOSIS — G43909 Migraine, unspecified, not intractable, without status migrainosus: Secondary | ICD-10-CM | POA: Diagnosis not present

## 2013-07-23 DIAGNOSIS — K219 Gastro-esophageal reflux disease without esophagitis: Secondary | ICD-10-CM | POA: Diagnosis not present

## 2013-07-29 DIAGNOSIS — I69998 Other sequelae following unspecified cerebrovascular disease: Secondary | ICD-10-CM | POA: Diagnosis not present

## 2013-08-08 ENCOUNTER — Ambulatory Visit (INDEPENDENT_AMBULATORY_CARE_PROVIDER_SITE_OTHER): Payer: Medicare Other | Admitting: Cardiovascular Disease

## 2013-08-08 ENCOUNTER — Encounter: Payer: Self-pay | Admitting: Cardiovascular Disease

## 2013-08-08 ENCOUNTER — Ambulatory Visit: Payer: Medicare Other | Admitting: Cardiovascular Disease

## 2013-08-08 VITALS — BP 130/86 | HR 58 | Ht 65.5 in | Wt 166.1 lb

## 2013-08-08 DIAGNOSIS — R9431 Abnormal electrocardiogram [ECG] [EKG]: Secondary | ICD-10-CM

## 2013-08-08 DIAGNOSIS — I10 Essential (primary) hypertension: Secondary | ICD-10-CM | POA: Diagnosis not present

## 2013-08-08 DIAGNOSIS — E785 Hyperlipidemia, unspecified: Secondary | ICD-10-CM | POA: Diagnosis not present

## 2013-08-08 DIAGNOSIS — I517 Cardiomegaly: Secondary | ICD-10-CM

## 2013-08-08 NOTE — Progress Notes (Signed)
08/08/2013 [name]   08-18-1940  [id number]  Primary Physician Horatio Pel, MD Primary Cardiologist: Lorretta Harp MD Renae Gloss   HPI:  [name] is a 73 year old 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 She was referred by Dr. Dr. Deland Pretty at [facility]  medical for evaluation of generalized fatigue and an abnormal EKG. Her cardiovascular risk factor profile is remarkable for hypertension, hyperlipidemia and family history. Her father had heart-related issues in the 38s and a brother did have a stent before he died of lung cancer in his 58s. She has never had a heart attack or stroke. She does complain of dyspnea but denies chest pain. She was referred for an abnormal EKG with ST segment changes.   Current Outpatient Prescriptions  Medication Sig Dispense Refill  . aspirin EC 81 MG tablet Take 81 mg by mouth daily.      Marland Kitchen atorvastatin (LIPITOR) 20 MG tablet Take 20 mg by mouth daily.      . Calcium Carbonate-Vit D-Min (CALCIUM 1200 PO) Take 1 tablet by mouth daily.      . cholecalciferol (VITAMIN D) 1000 UNITS tablet Take 1,000 Units by mouth daily.      . meclizine (ANTIVERT) 25 MG tablet Take 25 mg by mouth as needed for dizziness.      . Multiple Vitamins-Minerals (ICAPS MV PO) Take 1 tablet by mouth 2 (two) times daily.      . propranolol ER (INDERAL LA) 60 MG 24 hr capsule Take 1 capsule by mouth daily.      . RESTASIS 0.05 % ophthalmic emulsion Place 1 drop into both eyes 2 (two) times daily.      . rizatriptan (MAXALT-MLT) 10 MG disintegrating tablet Take 1 tablet by mouth as needed.      . triamterene-hydrochlorothiazide (MAXZIDE-25) 37.5-25 MG per tablet Take 1 tablet by mouth 3 (three) times a week.       No current facility-administered medications for this visit.    Allergies  Allergen  Reactions  . Antihistamines, Diphenhydramine-Type   . Codeine     History   Social History  . Marital Status: Widowed    Spouse Name: N/A    Number of Children: N/A  . Years of Education: N/A   Occupational History  . Not on file.   Social History Main Topics  . Smoking status: Never Smoker   . Smokeless tobacco: Never Used  . Alcohol Use: No  . Drug Use: No  . Sexual Activity: Not on file   Other Topics Concern  . Not on file   Social History Narrative  . No narrative on file     Review of Systems: General: negative for chills, fever, night sweats or weight changes.  Cardiovascular: negative for chest pain, dyspnea on exertion, edema, orthopnea, palpitations, paroxysmal nocturnal dyspnea or shortness of breath Dermatological: negative for rash Respiratory: negative for cough or wheezing Urologic: negative for hematuria Abdominal: negative for nausea, vomiting, diarrhea, bright red blood per rectum, melena, or hematemesis Neurologic: negative for visual changes, syncope, or dizziness All other systems reviewed and are otherwise negative except as noted above.    Blood pressure 130/86, pulse 58, height 5' 5.5" (1.664 m), weight 166 lb 1.6 oz (75.342 kg).  General appearance: alert and no distress Neck: no adenopathy, no carotid bruit, no JVD,  supple, symmetrical, trachea midline and thyroid not enlarged, symmetric, no tenderness/mass/nodules Lungs: clear to auscultation bilaterally Heart: regular rate and rhythm, S1, S2 normal, no murmur, click, rub or gallop Extremities: extremities normal, atraumatic, no cyanosis or edema and 2+ pedal pulses bilaterally  EKG sinus bradycardia at 58 with LVH voltage and ST segment changes consistent with repolarization.  ASSESSMENT AND PLAN:   Abnormal EKG Patient was referred by Dr. Deland Pretty for evaluation of an abnormal EKG. She has LVH voltage with repolarization changes. She does have a history of hypertension. Her  cardiac risk factor otherwise is only remarkable for family history with a father who had a myocardial infarction in his 53s and a brother who had a stent. Otherwise there is factors include hypertension and hyperlipidemia. She is completely asymptomatic other than generalized fatigue which is nonspecific. I'm going to get a 2-D echocardiogram to evaluate LV function and LVH.  Essential hypertension Controlled on current medications  Hyperlipidemia Not on statin therapy. Followed by her PCP      Lorretta Harp MD [facility] 08/08/2013 8:36 AM

## 2013-08-08 NOTE — Assessment & Plan Note (Signed)
Controlled on current medications 

## 2013-08-08 NOTE — Assessment & Plan Note (Signed)
Patient was referred by Dr. Deland Pretty for evaluation of an abnormal EKG. She has LVH voltage with repolarization changes. She does have a history of hypertension. Her cardiac risk factor otherwise is only remarkable for family history with a father who had a myocardial infarction in his 25s and a brother who had a stent. Otherwise there is factors include hypertension and hyperlipidemia. She is completely asymptomatic other than generalized fatigue which is nonspecific. I'm going to get a 2-D echocardiogram to evaluate LV function and LVH.

## 2013-08-08 NOTE — Patient Instructions (Signed)
Your physician has requested that you have an echocardiogram. Echocardiography is a painless test that uses sound waves to create images of your heart. It provides your doctor with information about the size and shape of your heart and how well your heart's chambers and valves are working. This procedure takes approximately one hour. There are no restrictions for this procedure.  Dr Gwenlyn Found recommends that you follow-up with him on an as needed basis.

## 2013-08-08 NOTE — Assessment & Plan Note (Signed)
Not on statin therapy. Followed by her PCP

## 2013-08-09 ENCOUNTER — Ambulatory Visit: Payer: Medicare Other | Admitting: Cardiovascular Disease

## 2013-08-09 DIAGNOSIS — R42 Dizziness and giddiness: Secondary | ICD-10-CM | POA: Diagnosis not present

## 2013-08-09 DIAGNOSIS — I69998 Other sequelae following unspecified cerebrovascular disease: Secondary | ICD-10-CM | POA: Diagnosis not present

## 2013-08-14 ENCOUNTER — Ambulatory Visit (HOSPITAL_COMMUNITY)
Admission: RE | Admit: 2013-08-14 | Discharge: 2013-08-14 | Disposition: A | Discharge status: Home / Self Care | Payer: Medicare Other | Source: Ambulatory Visit | Attending: Internal Medicine | Admitting: Internal Medicine

## 2013-08-14 DIAGNOSIS — R9431 Abnormal electrocardiogram [ECG] [EKG]: Secondary | ICD-10-CM | POA: Insufficient documentation

## 2013-08-14 DIAGNOSIS — I517 Cardiomegaly: Secondary | ICD-10-CM

## 2013-08-14 DIAGNOSIS — I359 Nonrheumatic aortic valve disorder, unspecified: Secondary | ICD-10-CM | POA: Diagnosis not present

## 2013-08-14 NOTE — Progress Notes (Signed)
2D Echo Performed 08/14/2013    Damien Batty, RCS  

## 2013-08-20 ENCOUNTER — Encounter: Payer: Self-pay | Admitting: *Deleted

## 2013-08-26 ENCOUNTER — Telehealth: Payer: Self-pay | Admitting: Cardiovascular Disease

## 2013-08-26 NOTE — Telephone Encounter (Signed)
Is wanting to know the results of her Echo .Marland KitchenPlease Call

## 2013-08-26 NOTE — Telephone Encounter (Signed)
RN returned call and informed patient of echo results. Also informed that letter had been mailed to patient on 6/9.   Patient reports that patient with MRN [id number] had received her letter regarding echo in her letter regarding stress test that was documented to be mailed on same day.   Patient requests that letter with echo results be mailed to her. RN verified patient address and letter mailed.

## 2013-09-20 DIAGNOSIS — H6120 Impacted cerumen, unspecified ear: Secondary | ICD-10-CM | POA: Diagnosis not present

## 2013-09-20 DIAGNOSIS — H902 Conductive hearing loss, unspecified: Secondary | ICD-10-CM | POA: Diagnosis not present

## 2013-11-15 DIAGNOSIS — H33009 Unspecified retinal detachment with retinal break, unspecified eye: Secondary | ICD-10-CM | POA: Diagnosis not present

## 2013-11-15 DIAGNOSIS — H01009 Unspecified blepharitis unspecified eye, unspecified eyelid: Secondary | ICD-10-CM | POA: Diagnosis not present

## 2013-11-15 DIAGNOSIS — B399 Histoplasmosis, unspecified: Secondary | ICD-10-CM | POA: Diagnosis not present

## 2013-11-15 DIAGNOSIS — H32 Chorioretinal disorders in diseases classified elsewhere: Secondary | ICD-10-CM | POA: Diagnosis not present

## 2013-12-25 DIAGNOSIS — Z8489 Family history of other specified conditions: Secondary | ICD-10-CM | POA: Diagnosis not present

## 2013-12-25 DIAGNOSIS — H35372 Puckering of macula, left eye: Secondary | ICD-10-CM | POA: Diagnosis not present

## 2013-12-25 DIAGNOSIS — Z885 Allergy status to narcotic agent status: Secondary | ICD-10-CM | POA: Diagnosis not present

## 2013-12-25 DIAGNOSIS — Z7982 Long term (current) use of aspirin: Secondary | ICD-10-CM | POA: Diagnosis not present

## 2013-12-25 DIAGNOSIS — H3581 Retinal edema: Secondary | ICD-10-CM | POA: Diagnosis not present

## 2013-12-25 DIAGNOSIS — Z823 Family history of stroke: Secondary | ICD-10-CM | POA: Diagnosis not present

## 2013-12-25 DIAGNOSIS — Z809 Family history of malignant neoplasm, unspecified: Secondary | ICD-10-CM | POA: Diagnosis not present

## 2013-12-25 DIAGNOSIS — Z888 Allergy status to other drugs, medicaments and biological substances status: Secondary | ICD-10-CM | POA: Diagnosis not present

## 2013-12-25 DIAGNOSIS — Z8249 Family history of ischemic heart disease and other diseases of the circulatory system: Secondary | ICD-10-CM | POA: Diagnosis not present

## 2013-12-26 DIAGNOSIS — H35372 Puckering of macula, left eye: Secondary | ICD-10-CM | POA: Diagnosis not present

## 2013-12-31 ENCOUNTER — Other Ambulatory Visit: Payer: Self-pay

## 2013-12-31 DIAGNOSIS — Z1231 Encounter for screening mammogram for malignant neoplasm of breast: Secondary | ICD-10-CM

## 2014-01-07 DIAGNOSIS — H35372 Puckering of macula, left eye: Secondary | ICD-10-CM | POA: Diagnosis not present

## 2014-01-10 DIAGNOSIS — Z23 Encounter for immunization: Secondary | ICD-10-CM | POA: Diagnosis not present

## 2014-01-24 DIAGNOSIS — H9202 Otalgia, left ear: Secondary | ICD-10-CM | POA: Diagnosis not present

## 2014-01-24 DIAGNOSIS — H902 Conductive hearing loss, unspecified: Secondary | ICD-10-CM | POA: Diagnosis not present

## 2014-01-24 DIAGNOSIS — H6123 Impacted cerumen, bilateral: Secondary | ICD-10-CM | POA: Diagnosis not present

## 2014-01-31 ENCOUNTER — Ambulatory Visit
Admission: RE | Admit: 2014-01-31 | Discharge: 2014-01-31 | Disposition: A | Discharge status: Home / Self Care | Payer: Medicare Other | Source: Ambulatory Visit

## 2014-01-31 ENCOUNTER — Encounter (INDEPENDENT_AMBULATORY_CARE_PROVIDER_SITE_OTHER): Payer: Self-pay

## 2014-01-31 DIAGNOSIS — Z1231 Encounter for screening mammogram for malignant neoplasm of breast: Secondary | ICD-10-CM

## 2014-03-04 DIAGNOSIS — H35372 Puckering of macula, left eye: Secondary | ICD-10-CM | POA: Diagnosis not present

## 2014-03-04 DIAGNOSIS — H33002 Unspecified retinal detachment with retinal break, left eye: Secondary | ICD-10-CM | POA: Diagnosis not present

## 2014-03-25 DIAGNOSIS — H35352 Cystoid macular degeneration, left eye: Secondary | ICD-10-CM | POA: Diagnosis not present

## 2014-03-25 DIAGNOSIS — H34832 Tributary (branch) retinal vein occlusion, left eye: Secondary | ICD-10-CM | POA: Diagnosis not present

## 2014-04-11 DIAGNOSIS — J0100 Acute maxillary sinusitis, unspecified: Secondary | ICD-10-CM | POA: Diagnosis not present

## 2014-05-19 DIAGNOSIS — H2513 Age-related nuclear cataract, bilateral: Secondary | ICD-10-CM | POA: Diagnosis not present

## 2014-05-19 DIAGNOSIS — H4052X4 Glaucoma secondary to other eye disorders, left eye, indeterminate stage: Secondary | ICD-10-CM | POA: Diagnosis not present

## 2014-05-20 DIAGNOSIS — H4052X2 Glaucoma secondary to other eye disorders, left eye, moderate stage: Secondary | ICD-10-CM | POA: Diagnosis not present

## 2014-05-30 DIAGNOSIS — H35372 Puckering of macula, left eye: Secondary | ICD-10-CM | POA: Diagnosis not present

## 2014-05-30 DIAGNOSIS — H2513 Age-related nuclear cataract, bilateral: Secondary | ICD-10-CM | POA: Diagnosis not present

## 2014-05-30 DIAGNOSIS — H33002 Unspecified retinal detachment with retinal break, left eye: Secondary | ICD-10-CM | POA: Diagnosis not present

## 2014-05-30 DIAGNOSIS — H4052X4 Glaucoma secondary to other eye disorders, left eye, indeterminate stage: Secondary | ICD-10-CM | POA: Diagnosis not present

## 2014-06-16 ENCOUNTER — Encounter (HOSPITAL_COMMUNITY): Payer: Self-pay | Admitting: Emergency Medicine

## 2014-06-16 DIAGNOSIS — S8001XA Contusion of right knee, initial encounter: Secondary | ICD-10-CM | POA: Diagnosis not present

## 2014-06-16 DIAGNOSIS — Z8669 Personal history of other diseases of the nervous system and sense organs: Secondary | ICD-10-CM | POA: Insufficient documentation

## 2014-06-16 DIAGNOSIS — Y9389 Activity, other specified: Secondary | ICD-10-CM | POA: Diagnosis not present

## 2014-06-16 DIAGNOSIS — S00211A Abrasion of right eyelid and periocular area, initial encounter: Secondary | ICD-10-CM | POA: Diagnosis not present

## 2014-06-16 DIAGNOSIS — S199XXA Unspecified injury of neck, initial encounter: Secondary | ICD-10-CM | POA: Diagnosis not present

## 2014-06-16 DIAGNOSIS — Z7982 Long term (current) use of aspirin: Secondary | ICD-10-CM | POA: Diagnosis not present

## 2014-06-16 DIAGNOSIS — S60222A Contusion of left hand, initial encounter: Secondary | ICD-10-CM | POA: Diagnosis not present

## 2014-06-16 DIAGNOSIS — H40021 Open angle with borderline findings, high risk, right eye: Secondary | ICD-10-CM | POA: Diagnosis not present

## 2014-06-16 DIAGNOSIS — S60221A Contusion of right hand, initial encounter: Secondary | ICD-10-CM | POA: Diagnosis not present

## 2014-06-16 DIAGNOSIS — M25532 Pain in left wrist: Secondary | ICD-10-CM | POA: Diagnosis not present

## 2014-06-16 DIAGNOSIS — Z23 Encounter for immunization: Secondary | ICD-10-CM | POA: Insufficient documentation

## 2014-06-16 DIAGNOSIS — Y9289 Other specified places as the place of occurrence of the external cause: Secondary | ICD-10-CM | POA: Diagnosis not present

## 2014-06-16 DIAGNOSIS — Y998 Other external cause status: Secondary | ICD-10-CM | POA: Diagnosis not present

## 2014-06-16 DIAGNOSIS — W010XXA Fall on same level from slipping, tripping and stumbling without subsequent striking against object, initial encounter: Secondary | ICD-10-CM | POA: Diagnosis not present

## 2014-06-16 DIAGNOSIS — H4052X2 Glaucoma secondary to other eye disorders, left eye, moderate stage: Secondary | ICD-10-CM | POA: Diagnosis not present

## 2014-06-16 DIAGNOSIS — S0083XA Contusion of other part of head, initial encounter: Secondary | ICD-10-CM | POA: Insufficient documentation

## 2014-06-16 DIAGNOSIS — M79641 Pain in right hand: Secondary | ICD-10-CM | POA: Diagnosis not present

## 2014-06-16 DIAGNOSIS — S6991XA Unspecified injury of right wrist, hand and finger(s), initial encounter: Secondary | ICD-10-CM | POA: Diagnosis not present

## 2014-06-16 DIAGNOSIS — E785 Hyperlipidemia, unspecified: Secondary | ICD-10-CM | POA: Diagnosis not present

## 2014-06-16 DIAGNOSIS — M79645 Pain in left finger(s): Secondary | ICD-10-CM | POA: Diagnosis not present

## 2014-06-16 DIAGNOSIS — M19032 Primary osteoarthritis, left wrist: Secondary | ICD-10-CM | POA: Diagnosis not present

## 2014-06-16 DIAGNOSIS — I10 Essential (primary) hypertension: Secondary | ICD-10-CM | POA: Insufficient documentation

## 2014-06-16 DIAGNOSIS — S0081XA Abrasion of other part of head, initial encounter: Secondary | ICD-10-CM | POA: Insufficient documentation

## 2014-06-16 DIAGNOSIS — M1811 Unilateral primary osteoarthritis of first carpometacarpal joint, right hand: Secondary | ICD-10-CM | POA: Diagnosis not present

## 2014-06-16 DIAGNOSIS — S6992XA Unspecified injury of left wrist, hand and finger(s), initial encounter: Secondary | ICD-10-CM | POA: Diagnosis not present

## 2014-06-16 DIAGNOSIS — S0990XA Unspecified injury of head, initial encounter: Secondary | ICD-10-CM | POA: Diagnosis present

## 2014-06-16 DIAGNOSIS — Z79899 Other long term (current) drug therapy: Secondary | ICD-10-CM | POA: Diagnosis not present

## 2014-06-16 DIAGNOSIS — M1812 Unilateral primary osteoarthritis of first carpometacarpal joint, left hand: Secondary | ICD-10-CM | POA: Diagnosis not present

## 2014-06-16 DIAGNOSIS — S0003XA Contusion of scalp, initial encounter: Secondary | ICD-10-CM | POA: Diagnosis not present

## 2014-06-16 NOTE — ED Notes (Addendum)
Pt st's she tripped over a rope and fell on concrete.  Pt has abrasion to right forehead, pain bil hands, right knee and left lower leg.  Pt denies LOC but st's she was stunned for a few min.

## 2014-06-17 ENCOUNTER — Emergency Department (HOSPITAL_COMMUNITY)
Admission: EM | Admit: 2014-06-17 | Discharge: 2014-06-17 | Disposition: A | Discharge status: Home / Self Care | Payer: Medicare Other | Attending: Emergency Medicine | Admitting: Emergency Medicine

## 2014-06-17 ENCOUNTER — Encounter (HOSPITAL_COMMUNITY): Payer: Self-pay

## 2014-06-17 ENCOUNTER — Emergency Department (HOSPITAL_COMMUNITY): Payer: Medicare Other

## 2014-06-17 DIAGNOSIS — M1812 Unilateral primary osteoarthritis of first carpometacarpal joint, left hand: Secondary | ICD-10-CM | POA: Diagnosis not present

## 2014-06-17 DIAGNOSIS — S8001XA Contusion of right knee, initial encounter: Secondary | ICD-10-CM

## 2014-06-17 DIAGNOSIS — W010XXA Fall on same level from slipping, tripping and stumbling without subsequent striking against object, initial encounter: Secondary | ICD-10-CM

## 2014-06-17 DIAGNOSIS — M25532 Pain in left wrist: Secondary | ICD-10-CM | POA: Diagnosis not present

## 2014-06-17 DIAGNOSIS — S0081XA Abrasion of other part of head, initial encounter: Secondary | ICD-10-CM

## 2014-06-17 DIAGNOSIS — S60222A Contusion of left hand, initial encounter: Secondary | ICD-10-CM

## 2014-06-17 DIAGNOSIS — S0003XA Contusion of scalp, initial encounter: Secondary | ICD-10-CM | POA: Diagnosis not present

## 2014-06-17 DIAGNOSIS — S6992XA Unspecified injury of left wrist, hand and finger(s), initial encounter: Secondary | ICD-10-CM | POA: Diagnosis not present

## 2014-06-17 DIAGNOSIS — M79645 Pain in left finger(s): Secondary | ICD-10-CM | POA: Diagnosis not present

## 2014-06-17 DIAGNOSIS — S199XXA Unspecified injury of neck, initial encounter: Secondary | ICD-10-CM | POA: Diagnosis not present

## 2014-06-17 DIAGNOSIS — S00211A Abrasion of right eyelid and periocular area, initial encounter: Secondary | ICD-10-CM | POA: Diagnosis not present

## 2014-06-17 DIAGNOSIS — M79641 Pain in right hand: Secondary | ICD-10-CM | POA: Diagnosis not present

## 2014-06-17 DIAGNOSIS — S0083XA Contusion of other part of head, initial encounter: Secondary | ICD-10-CM | POA: Diagnosis not present

## 2014-06-17 DIAGNOSIS — S60221A Contusion of right hand, initial encounter: Secondary | ICD-10-CM

## 2014-06-17 DIAGNOSIS — M19032 Primary osteoarthritis, left wrist: Secondary | ICD-10-CM | POA: Diagnosis not present

## 2014-06-17 DIAGNOSIS — M1811 Unilateral primary osteoarthritis of first carpometacarpal joint, right hand: Secondary | ICD-10-CM | POA: Diagnosis not present

## 2014-06-17 DIAGNOSIS — S6991XA Unspecified injury of right wrist, hand and finger(s), initial encounter: Secondary | ICD-10-CM | POA: Diagnosis not present

## 2014-06-17 MED ORDER — TETANUS-DIPHTH-ACELL PERTUSSIS 5-2.5-18.5 LF-MCG/0.5 IM SUSP
0.5000 mL | Freq: Once | INTRAMUSCULAR | Status: AC
  Administered 2014-06-17: 0.5 mL via INTRAMUSCULAR
  Filled 2014-06-17: qty 0.5

## 2014-06-17 MED ORDER — TRAMADOL HCL 50 MG PO TABS
50.0000 mg | ORAL_TABLET | Freq: Once | ORAL | Status: AC
  Administered 2014-06-17: 50 mg via ORAL
  Filled 2014-06-17: qty 1

## 2014-06-17 MED ORDER — TRAMADOL HCL 50 MG PO TABS
50.0000 mg | ORAL_TABLET | Freq: Four times a day (QID) | ORAL | Status: DC | PRN
Start: 2014-06-17 — End: 2017-03-13

## 2014-06-17 NOTE — Discharge Instructions (Signed)
Contusion A contusion is a deep bruise. Contusions are the result of an injury that caused bleeding under the skin. The contusion may turn blue, purple, or yellow. Minor injuries will give you a painless contusion, but more severe contusions may stay painful and swollen for a few weeks.  CAUSES  A contusion is usually caused by a blow, trauma, or direct force to an area of the body. SYMPTOMS   Swelling and redness of the injured area.  Bruising of the injured area.  Tenderness and soreness of the injured area.  Pain. DIAGNOSIS  The diagnosis can be made by taking a history and physical exam. An X-ray, CT scan, or MRI may be needed to determine if there were any associated injuries, such as fractures. TREATMENT  Specific treatment will depend on what area of the body was injured. In general, the best treatment for a contusion is resting, icing, elevating, and applying cold compresses to the injured area. Over-the-counter medicines may also be recommended for pain control. Ask your caregiver what the best treatment is for your contusion. HOME CARE INSTRUCTIONS   Put ice on the injured area.  Put ice in a plastic bag.  Place a towel between your skin and the bag.  Leave the ice on for 15-20 minutes, 3-4 times a day, or as directed by your health care provider.  Only take over-the-counter or prescription medicines for pain, discomfort, or fever as directed by your caregiver. Your caregiver may recommend avoiding anti-inflammatory medicines (aspirin, ibuprofen, and naproxen) for 48 hours because these medicines may increase bruising.  Rest the injured area.  If possible, elevate the injured area to reduce swelling. SEEK IMMEDIATE MEDICAL CARE IF:   You have increased bruising or swelling.  You have pain that is getting worse.  Your swelling or pain is not relieved with medicines. MAKE SURE YOU:   Understand these instructions.  Will watch your condition.  Will get help right  away if you are not doing well or get worse. Document Released: 12/08/2004 Document Revised: 03/05/2013 Document Reviewed: 01/03/2011 [facility] Patient Information 2015 [place], [place]. This information is not intended to replace advice given to you by your health care provider. Make sure you discuss any questions you have with your health care provider.   Abrasion An abrasion is a cut or scrape of the skin. Abrasions do not extend through all layers of the skin and most heal within 10 days. It is important to care for your abrasion properly to prevent infection. CAUSES  Most abrasions are caused by falling on, or gliding across, the ground or other surface. When your skin rubs on something, the outer and inner layer of skin rubs off, causing an abrasion. DIAGNOSIS  Your caregiver will be able to diagnose an abrasion during a physical exam.  TREATMENT  Your treatment depends on how large and deep the abrasion is. Generally, your abrasion will be cleaned with water and a mild soap to remove any dirt or debris. An antibiotic ointment may be put over the abrasion to prevent an infection. A bandage (dressing) may be wrapped around the abrasion to keep it from getting dirty.  You may need a tetanus shot if:  You cannot remember when you had your last tetanus shot.  You have never had a tetanus shot.  The injury broke your skin. If you get a tetanus shot, your arm may swell, get red, and feel warm to the touch. This is common and not a problem. If you need  a tetanus shot and you choose not to have one, there is a rare chance of getting tetanus. Sickness from tetanus can be serious.  HOME CARE INSTRUCTIONS   If a dressing was applied, change it at least once a day or as directed by your caregiver. If the bandage sticks, soak it off with warm water.   Wash the area with water and a mild soap to remove all the ointment 2 times a day. Rinse off the soap and pat the area dry with a clean towel.    Reapply any ointment as directed by your caregiver. This will help prevent infection and keep the bandage from sticking. Use gauze over the wound and under the dressing to help keep the bandage from sticking.   Change your dressing right away if it becomes wet or dirty.   Only take over-the-counter or prescription medicines for pain, discomfort, or fever as directed by your caregiver.   Follow up with your caregiver within 24-48 hours for a wound check, or as directed. If you were not given a wound-check appointment, look closely at your abrasion for redness, swelling, or pus. These are signs of infection. SEEK IMMEDIATE MEDICAL CARE IF:   You have increasing pain in the wound.   You have redness, swelling, or tenderness around the wound.   You have pus coming from the wound.   You have a fever or persistent symptoms for more than 2-3 days.  You have a fever and your symptoms suddenly get worse.  You have a bad smell coming from the wound or dressing.  MAKE SURE YOU:   Understand these instructions.  Will watch your condition.  Will get help right away if you are not doing well or get worse. Document Released: 12/08/2004 Document Revised: 02/15/2012 Document Reviewed: 02/01/2011 [facility] Patient Information 2015 [place], [place]. This information is not intended to replace advice given to you by your health care provider. Make sure you discuss any questions you have with your health care provider.  Td Vaccine (Tetanus and Diphtheria): What You Need to Know 1. Why get vaccinated? Tetanus  and diphtheria are very serious diseases. They are rare in the [place] today, but people who do become infected often have severe complications. Td vaccine is used to protect adolescents and adults from both of these diseases. Both tetanus and diphtheria are infections caused by bacteria. Diphtheria spreads from person to person through coughing or sneezing. Tetanus-causing bacteria  enter the body through cuts, scratches, or wounds. TETANUS (Lockjaw) causes painful muscle tightening and stiffness, usually all over the body.  It can lead to tightening of muscles in the head and neck so you can't open your mouth, swallow, or sometimes even breathe. Tetanus kills about 1 out of every 5 people who are infected. DIPHTHERIA can cause a thick coating to form in the back of the throat.  It can lead to breathing problems, paralysis, heart failure, and death. Before vaccines, the [place] saw as many as 200,000 cases a year of diphtheria and hundreds of cases of tetanus. Since vaccination began, cases of both diseases have dropped by about 99%. 2. Td vaccine Td vaccine can protect adolescents and adults from tetanus and diphtheria. Td is usually given as a booster dose every 10 years but it can also be given earlier after a severe and dirty wound or burn. Your doctor can give you more information. Td may safely be given at the same time as other vaccines. 3. Some people should  not get this vaccine  If you ever had a life-threatening allergic reaction after a dose of any tetanus or diphtheria containing vaccine, OR if you have a severe allergy to any part of this vaccine, you should not get Td. Tell your doctor if you have any severe allergies.  Talk to your doctor if you:  have epilepsy or another nervous system problem,  had severe pain or swelling after any vaccine containing diphtheria or tetanus,  ever had Guillain Barr Syndrome (GBS),  aren't feeling well on the day the shot is scheduled. 4. Risks of a vaccine reaction With a vaccine, like any medicine, there is a chance of side effects. These are usually mild and go away on their own. Serious side effects are also possible, but are very rare. Most people who get Td vaccine do not have any problems with it. Mild Problems  following Td (Did not interfere with activities)  Pain where the shot was given (about 8  people in 10)  Redness or swelling where the shot was given (about 1 person in 3)  Mild fever (about 1 person in 15)  Headache or Tiredness (uncommon) Moderate Problems following Td (Interfered with activities, but did not require medical attention)  Fever over 102F (rare) Severe Problems  following Td (Unable to perform usual activities; required medical attention)  Swelling, severe pain, bleeding and/or redness in the arm where the shot was given (rare). Problems that could happen after any vaccine:  Brief fainting spells can happen after any medical procedure, including vaccination. Sitting or lying down for about 15 minutes can help prevent fainting, and injuries caused by a fall. Tell your doctor if you feel dizzy, or have vision changes or ringing in the ears.  Severe shoulder pain and reduced range of motion in the arm where a shot was given can happen, very rarely, after a vaccination.  Severe allergic reactions from a vaccine are very rare, estimated at less than 1 in a million doses. If one were to occur, it would usually be within a few minutes to a few hours after the vaccination. 5. What if there is a serious reaction? What should I look for?  Look for anything that concerns you, such as signs of a severe allergic reaction, very high fever, or behavior changes. Signs of a severe allergic reaction can include hives, swelling of the face and throat, difficulty breathing, a fast heartbeat, dizziness, and weakness. These would usually start a few minutes to a few hours after the vaccination. What should I do?  If you think it is a severe allergic reaction or other emergency that can't wait, call 9-1-1 or get the person to the nearest hospital. Otherwise, call your doctor.  Afterward, the reaction should be reported to the Vaccine Adverse Event Reporting System (VAERS). Your doctor might file this report, or you can do it yourself through the VAERS web site at  www.vaers.SamedayNews.es, or by calling 704-303-7258. VAERS is only for reporting reactions. They do not give medical advice. 6. The National Vaccine Injury Compensation Program The [employer] Vaccine Injury Compensation Program (VICP) is a federal program that was created to compensate people who may have been injured by certain vaccines. Persons who believe they may have been injured by a vaccine can learn about the program and about filing a claim by calling 929-887-7245 or visiting the [place] website at GoldCloset.com.ee. 7. How can I learn more?  Ask your doctor.  Contact your local or state health department.  Contact  the Centers for Disease Control and Prevention (CDC):  Call 407-052-0500 (1-800-CDC-INFO)  Visit CDC's website at http://hunter.com/ CDC Td Vaccine Interim VIS (04/17/12) Document Released: 12/26/2005 Document Revised: 07/15/2013 Document Reviewed: 06/12/2013 [facility] Patient Information 2015 [place], [place]. This information is not intended to replace advice given to you by your health care provider. Make sure you discuss any questions you have with your health care provider.  Tramadol tablets What is this medicine? TRAMADOL (TRA ma dole) is a pain reliever. It is used to treat moderate to severe pain in adults. This medicine may be used for other purposes; ask your health care provider or pharmacist if you have questions. COMMON BRAND NAME(S): Ultram What should I tell my health care provider before I take this medicine? They need to know if you have any of these conditions: -brain tumor -depression -drug abuse or addiction -head injury -if you frequently drink alcohol containing drinks -kidney disease or trouble passing urine -liver disease -lung disease, asthma, or breathing problems -seizures or epilepsy -suicidal thoughts, plans, or attempt; a previous suicide attempt by you or a family member -an unusual or allergic reaction to tramadol,  codeine, other medicines, foods, dyes, or preservatives -pregnant or trying to get pregnant -breast-feeding How should I use this medicine? Take this medicine by mouth with a full glass of water. Follow the directions on the prescription label. If the medicine upsets your stomach, take it with food or milk. Do not take more medicine than you are told to take. Talk to your pediatrician regarding the use of this medicine in children. Special care may be needed. Overdosage: If you think you have taken too much of this medicine contact a poison control center or emergency room at once. NOTE: This medicine is only for you. Do not share this medicine with others. What if I miss a dose? If you miss a dose, take it as soon as you can. If it is almost time for your next dose, take only that dose. Do not take double or extra doses. What may interact with this medicine? Do not take this medicine with any of the following medications: -MAOIs like Carbex, Eldepryl, Marplan, Nardil, and Parnate This medicine may also interact with the following medications: -alcohol or medicines that contain alcohol -antihistamines -benzodiazepines -bupropion -carbamazepine or oxcarbazepine -clozapine -cyclobenzaprine -digoxin -furazolidone -linezolid -medicines for depression, anxiety, or psychotic disturbances -medicines for migraine headache like almotriptan, eletriptan, frovatriptan, naratriptan, rizatriptan, sumatriptan, zolmitriptan -medicines for pain like pentazocine, buprenorphine, butorphanol, meperidine, nalbuphine, and propoxyphene -medicines for sleep -muscle relaxants -naltrexone -phenobarbital -phenothiazines like perphenazine, thioridazine, chlorpromazine, mesoridazine, fluphenazine, prochlorperazine, promazine, and trifluoperazine -procarbazine -warfarin This list may not describe all possible interactions. Give your health care provider a list of all the medicines, herbs, non-prescription drugs,  or dietary supplements you use. Also tell them if you smoke, drink alcohol, or use illegal drugs. Some items may interact with your medicine. What should I watch for while using this medicine? Tell your doctor or health care professional if your pain does not go away, if it gets worse, or if you have new or a different type of pain. You may develop tolerance to the medicine. Tolerance means that you will need a higher dose of the medicine for pain relief. Tolerance is normal and is expected if you take this medicine for a long time. Do not suddenly stop taking your medicine because you may develop a severe reaction. Your body becomes used to the medicine. This does NOT mean you are addicted.  Addiction is a behavior related to getting and using a drug for a non-medical reason. If you have pain, you have a medical reason to take pain medicine. Your doctor will tell you how much medicine to take. If your doctor wants you to stop the medicine, the dose will be slowly lowered over time to avoid any side effects. You may get drowsy or dizzy. Do not drive, use machinery, or do anything that needs mental alertness until you know how this medicine affects you. Do not stand or sit up quickly, especially if you are an older patient. This reduces the risk of dizzy or fainting spells. Alcohol can increase or decrease the effects of this medicine. Avoid alcoholic drinks. You may have constipation. Try to have a bowel movement at least every 2 to 3 days. If you do not have a bowel movement for 3 days, call your doctor or health care professional. Your mouth may get dry. Chewing sugarless gum or sucking hard candy, and drinking plenty of water may help. Contact your doctor if the problem does not go away or is severe. What side effects may I notice from receiving this medicine? Side effects that you should report to your doctor or health care professional as soon as possible: -allergic reactions like skin rash, itching or  hives, swelling of the face, lips, or tongue -breathing difficulties, wheezing -confusion -itching -light headedness or fainting spells -redness, blistering, peeling or loosening of the skin, including inside the mouth -seizures Side effects that usually do not require medical attention (report to your doctor or health care professional if they continue or are bothersome): -constipation -dizziness -drowsiness -headache -nausea, vomiting This list may not describe all possible side effects. Call your doctor for medical advice about side effects. You may report side effects to FDA at 1-800-FDA-1088. Where should I keep my medicine? Keep out of the reach of children. Store at room temperature between 15 and 30 degrees C (59 and 86 degrees F). Keep container tightly closed. Throw away any unused medicine after the expiration date. NOTE: This sheet is a summary. It may not cover all possible information. If you have questions about this medicine, talk to your doctor, pharmacist, or health care provider.  2015, [employer]. (2009-11-11 11:55:44)

## 2014-06-17 NOTE — ED Notes (Signed)
Patient transported to CT 

## 2014-06-17 NOTE — ED Notes (Signed)
Patient wheeled to car with son driving her home. Teach back used and verbalized follow up care and discharge instructions

## 2014-06-17 NOTE — ED Provider Notes (Signed)
CSN: [id number]     Arrival date & time 06/16/14  2117 History   First MD Initiated Contact with Patient 06/17/14 0026     Chief Complaint  Patient presents with  . Fall     (Consider location/radiation/quality/duration/timing/severity/associated sxs/prior Treatment) Patient is a 74 y.o. female presenting with fall. The history is provided by the patient.  Fall  She tripped and fell in a parking lot falling on outstretched hands and striking the right side of her for her. She denies loss of consciousness but says that she saw stars. She is complaining of pain in the right side of her forehead and in both hands. She rates pain at 8/10. There is no nausea or vomiting and no visual change. She also hit her right knee but is not complaining of any pain in that knee. Minor injury also occurred to the left lower leg but she is not complaining of any pain there. She is on low-dose aspirin but no other anticoagulants or antiplatelet agents.  Past Medical History  Diagnosis Date  . Hyperlipidemia   . Visual disorder     Torn retina  . Abnormal EKG     LVH with repolarization changes  . Hyperlipidemia    Past Surgical History  Procedure Laterality Date  . Colon surgery    . Eye surgery     Family History  Problem Relation Age of Onset  . Stroke Mother 52  . Cancer Mother 16    Breast cancer  . Hypertension Mother   . Heart disease Father   . Hypertension Father   . Stroke Father 60  . Heart disease Brother 3  . Cancer Brother     Lung cancer  . Cancer Maternal Grandmother     Breast cancer  . Kidney disease Maternal Grandfather   . Diabetes Paternal Grandmother   . Hyperlipidemia Child    History  Substance Use Topics  . Smoking status: Never Smoker   . Smokeless tobacco: Never Used  . Alcohol Use: No   OB History    No data available     Review of Systems  All other systems reviewed and are negative.     Allergies  Antihistamines, diphenhydramine-type and  Codeine  Home Medications   Prior to Admission medications   Medication Sig Start Date End Date Taking? Authorizing Provider  aspirin EC 81 MG tablet Take 81 mg by mouth daily.   Yes Historical Provider, MD  atorvastatin (LIPITOR) 20 MG tablet Take 20 mg by mouth daily.   Yes Historical Provider, MD  Calcium Carbonate-Vit D-Min (CALCIUM 1200 PO) Take 1 tablet by mouth daily.   Yes Historical Provider, MD  cholecalciferol (VITAMIN D) 1000 UNITS tablet Take 1,000 Units by mouth daily.   Yes Historical Provider, MD  dorzolamide-timolol (COSOPT) 22.3-6.8 MG/ML ophthalmic solution Place 1 drop into the left eye 2 (two) times daily.   Yes Historical Provider, MD  meclizine (ANTIVERT) 25 MG tablet Take 25 mg by mouth as needed for dizziness.   Yes Historical Provider, MD  Multiple Vitamins-Minerals (ICAPS MV PO) Take 1 tablet by mouth 2 (two) times daily.   Yes Historical Provider, MD  propranolol ER (INDERAL LA) 60 MG 24 hr capsule Take 1 capsule by mouth daily. 08/07/13  Yes Historical Provider, MD  RESTASIS 0.05 % ophthalmic emulsion Place 1 drop into both eyes 2 (two) times daily. 07/01/13  Yes Historical Provider, MD  rizatriptan (MAXALT-MLT) 10 MG disintegrating tablet Take 1 tablet by mouth as  needed. 07/08/13  Yes Historical Provider, MD  travoprost, benzalkonium, (TRAVATAN) 0.004 % ophthalmic solution Place 1 drop into the left eye at bedtime.   Yes Historical Provider, MD  triamterene-hydrochlorothiazide (MAXZIDE-25) 37.5-25 MG per tablet Take 1 tablet by mouth 3 (three) times a week. No specific days 07/01/13  Yes Historical Provider, MD   BP 173/100 mmHg  Pulse 63  Temp(Src) 98 F (36.7 C) (Oral)  Resp 15  Ht 5\' 5"  (1.651 m)  Wt 165 lb (74.844 kg)  BMI 27.46 kg/m2  SpO2 98% Physical Exam  Nursing note and vitals reviewed.  74 year old female, resting comfortably and in no acute distress. Vital signs are significant for hypertension. Oxygen saturation is 98%, which is normal. Head is  normocephalic. Abrasion and ecchymosis is seen in the right side of the forehead and also the right malar area. There is no step off of the orbital rim. No discrete laceration. PERRLA, EOMI. Oropharynx is clear. Neck is nontender without adenopathy or JVD. Back is nontender and there is no CVA tenderness. Lungs are clear without rales, wheezes, or rhonchi. Chest is nontender. Heart has regular rate and rhythm without murmur. Abdomen is soft, flat, nontender without masses or hepatosplenomegaly and peristalsis is normoactive. Extremities have no cyanosis or edema, full range of motion is present. Ecchymosis and tenderness is present in the thenar eminence bilaterally without any bony tenderness identified. Ecchymosis on the left thenar eminence extends into the left wrist. There is no tenderness over any of the carpal bones on either hand. There is no pain with axial loading of the thumb. Distal neurovascular exam is intact. Abrasion is seen in the right knee without any swelling and full passive range of motion is present without pain. Minor abrasions are present over the left lower leg without any tenderness to palpation and full passive range of motion is present. Skin is warm and dry without rash. Neurologic: Mental status is normal, cranial nerves are intact, there are no motor or sensory deficits.  ED Course  Procedures (including critical care time)  Imaging Review Dg Wrist Complete Left  06/17/2014   CLINICAL DATA:  Left wrist pain after a fall today.  EXAM: LEFT WRIST - COMPLETE 3+ VIEW  COMPARISON:  None.  FINDINGS: Degenerative changes in the radiocarpal, STT, and first carpometacarpal joints. No evidence of acute fracture or subluxation. No focal bone lesion or bone destruction. Bone cortex and trabecular architecture appear intact. No radiopaque soft tissue foreign bodies.  IMPRESSION: Degenerative changes in the left wrist. No acute bony abnormalities.   Electronically Signed   By:  Lucienne Capers M.D.   On: 06/17/2014 01:53   Ct Head Wo Contrast  06/17/2014   CLINICAL DATA:  Patient fell in the parking lot striking right face on concrete. Abrasions to the right cheek and right eye.  EXAM: CT HEAD WITHOUT CONTRAST  CT MAXILLOFACIAL WITHOUT CONTRAST  CT CERVICAL SPINE WITHOUT CONTRAST  TECHNIQUE: Multidetector CT imaging of the head, cervical spine, and maxillofacial structures were performed using the standard protocol without intravenous contrast. Multiplanar CT image reconstructions of the cervical spine and maxillofacial structures were also generated.  COMPARISON:  CT head 03/02/2011. MRI cervical spine 11/15/2009. CT sinuses 04/19/2004.  FINDINGS: CT HEAD FINDINGS  Mild diffuse cerebral atrophy. No ventricular dilatation. Small subcutaneous scalp hematoma over the right anterior forehead. No mass effect or midline shift. No abnormal extra-axial fluid collections. Gray-white matter junctions are distinct. Basal cisterns are not effaced. No evidence of acute intracranial  hemorrhage. No depressed skull fractures. Mastoid air cells are not opacified.  CT MAXILLOFACIAL FINDINGS  Globes and extraocular muscles appear intact and symmetrical. Mild mucosal thickening in the left maxillary antrum. No acute air-fluid levels in the paranasal sinuses. Orbital, nasal, and facial bones, mandibles, temporomandibular joints, zygomatic arches, and pterygoid plates appear intact. No acute displaced fractures are identified. Degenerative changes noted in the temporomandibular joints.  CT CERVICAL SPINE FINDINGS  Normal alignment of the cervical spine and facet joints. Diffuse degenerative changes with narrowed cervical interspaces and endplate hypertrophic changes. Degenerative cysts are present. Degenerative changes throughout the facet joints. No vertebral compression deformities. No prevertebral soft tissue swelling. C1-2 articulation appears intact. No focal bone lesions identified. Soft tissues are  unremarkable.  IMPRESSION: No acute intracranial abnormalities. No acute orbital or facial fractures identified. Degenerative changes in the cervical spine with normal alignment. No acute displaced fractures identified.   Electronically Signed   By: Lucienne Capers M.D.   On: 06/17/2014 01:47   Ct Cervical Spine Wo Contrast  06/17/2014   CLINICAL DATA:  Patient fell in the parking lot striking right face on concrete. Abrasions to the right cheek and right eye.  EXAM: CT HEAD WITHOUT CONTRAST  CT MAXILLOFACIAL WITHOUT CONTRAST  CT CERVICAL SPINE WITHOUT CONTRAST  TECHNIQUE: Multidetector CT imaging of the head, cervical spine, and maxillofacial structures were performed using the standard protocol without intravenous contrast. Multiplanar CT image reconstructions of the cervical spine and maxillofacial structures were also generated.  COMPARISON:  CT head 03/02/2011. MRI cervical spine 11/15/2009. CT sinuses 04/19/2004.  FINDINGS: CT HEAD FINDINGS  Mild diffuse cerebral atrophy. No ventricular dilatation. Small subcutaneous scalp hematoma over the right anterior forehead. No mass effect or midline shift. No abnormal extra-axial fluid collections. Gray-white matter junctions are distinct. Basal cisterns are not effaced. No evidence of acute intracranial hemorrhage. No depressed skull fractures. Mastoid air cells are not opacified.  CT MAXILLOFACIAL FINDINGS  Globes and extraocular muscles appear intact and symmetrical. Mild mucosal thickening in the left maxillary antrum. No acute air-fluid levels in the paranasal sinuses. Orbital, nasal, and facial bones, mandibles, temporomandibular joints, zygomatic arches, and pterygoid plates appear intact. No acute displaced fractures are identified. Degenerative changes noted in the temporomandibular joints.  CT CERVICAL SPINE FINDINGS  Normal alignment of the cervical spine and facet joints. Diffuse degenerative changes with narrowed cervical interspaces and endplate  hypertrophic changes. Degenerative cysts are present. Degenerative changes throughout the facet joints. No vertebral compression deformities. No prevertebral soft tissue swelling. C1-2 articulation appears intact. No focal bone lesions identified. Soft tissues are unremarkable.  IMPRESSION: No acute intracranial abnormalities. No acute orbital or facial fractures identified. Degenerative changes in the cervical spine with normal alignment. No acute displaced fractures identified.   Electronically Signed   By: Lucienne Capers M.D.   On: 06/17/2014 01:47   Dg Hand Complete Left  06/17/2014   CLINICAL DATA:  Left first metacarpal pain after fall. Initial encounter.  EXAM: LEFT HAND - COMPLETE 3+ VIEW  COMPARISON:  None.  FINDINGS: No evidence of acute fracture or dislocation.  There is focal severe osteoarthritis of the first [facility] joint with bone-on-bone contact, bulky spurring, and subchondral cysts.  IMPRESSION: 1. No acute osseous findings. 2. 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   Electronically Signed   By: Monte Fantasia M.D.   On: 06/17/2014 01:53   Dg Hand Complete Right  06/17/2014   CLINICAL DATA:  Pain in the right hand at the fifth metacarpal after a  fall.  EXAM: RIGHT HAND - COMPLETE 3+ VIEW  COMPARISON:  None.  FINDINGS: Degenerative changes in the interphalangeal, first carpometacarpal and metacarpal phalangeal, and STT joints. No evidence of acute fracture or subluxation. No focal bone lesion or bone destruction. Bone cortex and trabecular architecture appear intact. No radiopaque soft tissue foreign bodies.  IMPRESSION: Negative.   Electronically Signed   By: Lucienne Capers M.D.   On: 06/17/2014 01:52   Ct Maxillofacial Wo Cm  06/17/2014   CLINICAL DATA:  Patient fell in the parking lot striking right face on concrete. Abrasions to the right cheek and right eye.  EXAM: CT HEAD WITHOUT CONTRAST  CT MAXILLOFACIAL WITHOUT CONTRAST  CT CERVICAL SPINE WITHOUT CONTRAST  TECHNIQUE: Multidetector CT  imaging of the head, cervical spine, and maxillofacial structures were performed using the standard protocol without intravenous contrast. Multiplanar CT image reconstructions of the cervical spine and maxillofacial structures were also generated.  COMPARISON:  CT head 03/02/2011. MRI cervical spine 11/15/2009. CT sinuses 04/19/2004.  FINDINGS: CT HEAD FINDINGS  Mild diffuse cerebral atrophy. No ventricular dilatation. Small subcutaneous scalp hematoma over the right anterior forehead. No mass effect or midline shift. No abnormal extra-axial fluid collections. Gray-white matter junctions are distinct. Basal cisterns are not effaced. No evidence of acute intracranial hemorrhage. No depressed skull fractures. Mastoid air cells are not opacified.  CT MAXILLOFACIAL FINDINGS  Globes and extraocular muscles appear intact and symmetrical. Mild mucosal thickening in the left maxillary antrum. No acute air-fluid levels in the paranasal sinuses. Orbital, nasal, and facial bones, mandibles, temporomandibular joints, zygomatic arches, and pterygoid plates appear intact. No acute displaced fractures are identified. Degenerative changes noted in the temporomandibular joints.  CT CERVICAL SPINE FINDINGS  Normal alignment of the cervical spine and facet joints. Diffuse degenerative changes with narrowed cervical interspaces and endplate hypertrophic changes. Degenerative cysts are present. Degenerative changes throughout the facet joints. No vertebral compression deformities. No prevertebral soft tissue swelling. C1-2 articulation appears intact. No focal bone lesions identified. Soft tissues are unremarkable.  IMPRESSION: No acute intracranial abnormalities. No acute orbital or facial fractures identified. Degenerative changes in the cervical spine with normal alignment. No acute displaced fractures identified.   Electronically Signed   By: Lucienne Capers M.D.   On: 06/17/2014 01:47    MDM   Final diagnoses:  Fall from  slip, trip, or stumble, initial encounter  Contusion of forehead, initial encounter  Contusion of face, initial encounter  Contusion of right hand, initial encounter  Contusion of left hand, initial encounter  Contusion of right knee, initial encounter  Abrasion of forehead, initial encounter    Fall with contusions but no obvious serious injury. She is being sent for CT scans of head, cervical spine, maxillofacial, and plain films of the right hand, left hand, left wrist.  CTs and x-rays are all negative for fracture or serious injury. She is discharged with prescription for tramadol for pain.  Delora Fuel, MD 80/99/83 [id number]

## 2014-06-17 NOTE — ED Notes (Signed)
Patient [place] in [place] parking [place]. States she fell on a median with landscaping wire that she did not see. Denies LOC, hit her right side of face, placed both hands out to catch herself and there is bruising noted to bilateral hands, abrasions to the right cheek, forehead and eye, a small Laceration to the right eye from her glasses, an abrasion to the right knee and scrapes to the left foot.

## 2014-06-19 ENCOUNTER — Emergency Department (HOSPITAL_COMMUNITY)
Admission: EM | Admit: 2014-06-19 | Discharge: 2014-06-19 | Disposition: A | Discharge status: Home / Self Care | Payer: Medicare Other | Attending: Emergency Medicine | Admitting: Emergency Medicine

## 2014-06-19 ENCOUNTER — Encounter (HOSPITAL_COMMUNITY): Payer: Self-pay | Admitting: Emergency Medicine

## 2014-06-19 DIAGNOSIS — E785 Hyperlipidemia, unspecified: Secondary | ICD-10-CM | POA: Diagnosis not present

## 2014-06-19 DIAGNOSIS — S80211A Abrasion, right knee, initial encounter: Secondary | ICD-10-CM

## 2014-06-19 DIAGNOSIS — Z8669 Personal history of other diseases of the nervous system and sense organs: Secondary | ICD-10-CM | POA: Diagnosis not present

## 2014-06-19 DIAGNOSIS — S80211D Abrasion, right knee, subsequent encounter: Secondary | ICD-10-CM | POA: Diagnosis not present

## 2014-06-19 DIAGNOSIS — M25569 Pain in unspecified knee: Secondary | ICD-10-CM | POA: Diagnosis not present

## 2014-06-19 DIAGNOSIS — M25561 Pain in right knee: Secondary | ICD-10-CM | POA: Diagnosis present

## 2014-06-19 DIAGNOSIS — W1839XD Other fall on same level, subsequent encounter: Secondary | ICD-10-CM | POA: Insufficient documentation

## 2014-06-19 DIAGNOSIS — Z79899 Other long term (current) drug therapy: Secondary | ICD-10-CM | POA: Insufficient documentation

## 2014-06-19 DIAGNOSIS — S8991XA Unspecified injury of right lower leg, initial encounter: Secondary | ICD-10-CM | POA: Diagnosis not present

## 2014-06-19 DIAGNOSIS — M25461 Effusion, right knee: Secondary | ICD-10-CM | POA: Insufficient documentation

## 2014-06-19 DIAGNOSIS — W01198A Fall on same level from slipping, tripping and stumbling with subsequent striking against other object, initial encounter: Secondary | ICD-10-CM | POA: Diagnosis not present

## 2014-06-19 DIAGNOSIS — Z7982 Long term (current) use of aspirin: Secondary | ICD-10-CM | POA: Diagnosis not present

## 2014-06-19 DIAGNOSIS — M2500 Hemarthrosis, unspecified joint: Secondary | ICD-10-CM | POA: Diagnosis not present

## 2014-06-19 NOTE — ED Notes (Signed)
Pt tripped and fell in a parking lot on Monday.  Was seen at [facility] for this and had a full workup.  Pt presented to her PCP today regarding increasing rt knee pain that radiates down rt leg.  Pt states that her doctor made her an ortho appt for tomorrow morning at 0800 but states she cannot wait that long.

## 2014-06-19 NOTE — ED Provider Notes (Signed)
CSN: [id number]     Arrival date & time 06/19/14  1714 History   First MD Initiated Contact with Patient 06/19/14 2041     Chief Complaint  Patient presents with  . Knee Pain  . Fall     (Consider location/radiation/quality/duration/timing/severity/associated sxs/prior Treatment) The history is provided by the patient.  [name] is a 74 y.o. female hx of HL, who presenting with right knee pain. She fell 4 days ago and was seen in the ER. She had normal CT head and neck and face. She has significant abrasion on the face as well as right knee. She went to see her regular doctor today and was noted to have a right knee effusion but had an x-ray that showed no fracture. She is scheduled to see orthopedics tomorrow primary care doctor was concerned for possible hemarthrosis and refer her to the ER. States that she has been walking on the leg and denies any fevers. States that tramadol has been controlling her pain.    Past Medical History  Diagnosis Date  . Hyperlipidemia   . Visual disorder     Torn retina  . Abnormal EKG     LVH with repolarization changes  . Hyperlipidemia    Past Surgical History  Procedure Laterality Date  . Colon surgery    . Eye surgery     Family History  Problem Relation Age of Onset  . Stroke Mother 58  . Cancer Mother 17    Breast cancer  . Hypertension Mother   . Heart disease Father   . Hypertension Father   . Stroke Father 66  . Heart disease Brother 69  . Cancer Brother     Lung cancer  . Cancer Maternal Grandmother     Breast cancer  . Kidney disease Maternal Grandfather   . Diabetes Paternal Grandmother   . Hyperlipidemia Child    History  Substance Use Topics  . Smoking status: Never Smoker   . Smokeless tobacco: Never Used  . Alcohol Use: No   OB History    No data available     Review of Systems  Musculoskeletal:       R knee pain   All other systems reviewed and are negative.     Allergies  Antihistamines,  diphenhydramine-type and Codeine  Home Medications   Prior to Admission medications   Medication Sig Start Date End Date Taking? Authorizing Provider  aspirin EC 81 MG tablet Take 81 mg by mouth daily.   Yes Historical Provider, MD  atorvastatin (LIPITOR) 20 MG tablet Take 20 mg by mouth daily.   Yes Historical Provider, MD  Calcium Carbonate-Vit D-Min (CALCIUM 1200 PO) Take 1 tablet by mouth daily.   Yes Historical Provider, MD  cholecalciferol (VITAMIN D) 1000 UNITS tablet Take 1,000 Units by mouth daily.   Yes Historical Provider, MD  dorzolamide-timolol (COSOPT) 22.3-6.8 MG/ML ophthalmic solution Place 1 drop into the left eye 2 (two) times daily.   Yes Historical Provider, MD  Multiple Vitamins-Minerals (ICAPS MV PO) Take 1 tablet by mouth 2 (two) times daily.   Yes Historical Provider, MD  propranolol ER (INDERAL LA) 60 MG 24 hr capsule Take 1 capsule by mouth daily. 08/07/13  Yes Historical Provider, MD  RESTASIS 0.05 % ophthalmic emulsion Place 1 drop into both eyes 2 (two) times daily. 07/01/13  Yes Historical Provider, MD  traMADol (ULTRAM) 50 MG tablet Take 1 tablet (50 mg total) by mouth every 6 (six) hours as needed.  03/15/73  Yes Delora Fuel, MD  travoprost, benzalkonium, (TRAVATAN) 0.004 % ophthalmic solution Place 1 drop into the left eye at bedtime.   Yes Historical Provider, MD  rizatriptan (MAXALT-MLT) 10 MG disintegrating tablet Take 1 tablet by mouth daily as needed for migraine.  07/08/13   Historical Provider, MD  triamterene-hydrochlorothiazide (MAXZIDE-25) 37.5-25 MG per tablet Take 1 tablet by mouth 3 (three) times a week. Takes on MWF. 07/01/13   Historical Provider, MD   BP 172/88 mmHg  Pulse 60  Temp(Src) 98.2 F (36.8 C) (Oral)  Resp 18  SpO2 100% Physical Exam  Constitutional: She is oriented to person, place, and time. She appears well-developed and well-nourished.  HENT:  Head: Normocephalic.  Mouth/Throat: Oropharynx is clear and moist.  R face with abrasion    Eyes: Conjunctivae are normal. Pupils are equal, round, and reactive to light.  Neck: Normal range of motion. Neck supple.  Cardiovascular: Normal rate, regular rhythm and normal heart sounds.   Pulmonary/Chest: Effort normal and breath sounds normal. No respiratory distress. She has no wheezes. She has no rales.  Abdominal: Soft. Bowel sounds are normal. She exhibits no distension. There is no tenderness. There is no rebound.  Musculoskeletal:  R knee nl ROM. Small effusion. Abrasion below R knee with mild erythema. No calf tenderness. 2+ pulses.   Neurological: She is alert and oriented to person, place, and time.  Skin: Skin is warm and dry.  Psychiatric: She has a normal mood and affect. Her behavior is normal. Judgment and thought content normal.  Nursing note and vitals reviewed.   ED Course  Procedures (including critical care time)  EMERGENCY DEPARTMENT US SOFT TISSUE INTERPRETATION "Study: Limited Ultrasound of the noted body part in comments below"  INDICATIONS: Pain Multiple views of the body part are obtained with a multi-frequency linear probe  PERFORMED BY:  Myself  IMAGES ARCHIVED?: No  SIDE:Right   BODY PART:Other soft tisse (comment in note)  FINDINGS: Normal soft tissue ultrasound  LIMITATIONS:  Body Habitus  INTERPRETATION:  No abnormal findings noted  COMMENT:  Small knee effusion, no abscess    Labs Review Labs Reviewed - No data to display  Imaging Review No results found.   EKG Interpretation None      MDM   Final diagnoses:  None    [name] is a 74 y.o. female here with R knee pain. Nl ROM. Beside US showed minimal effusion. No signs of DVT or septic knee. ? Superficial cellulitis but likely just local reaction from abrasion. She has ortho f/u tomorrow, will not need emergent ortho consult.      Wandra Arthurs, MD 06/19/14 2135

## 2014-06-19 NOTE — Discharge Instructions (Signed)
See orthopedic doctor tomorrow.   Continue tramadol for pain.   Return to ER if you have severe pain, unable to walk, fever, unable to bear weight on the knee.

## 2014-06-19 NOTE — ED Notes (Signed)
Patient reports pain is 6/10 with standing, worse when RLE is touched.

## 2014-06-20 DIAGNOSIS — S8001XA Contusion of right knee, initial encounter: Secondary | ICD-10-CM | POA: Diagnosis not present

## 2014-06-20 DIAGNOSIS — M25561 Pain in right knee: Secondary | ICD-10-CM | POA: Diagnosis not present

## 2014-07-05 DIAGNOSIS — J029 Acute pharyngitis, unspecified: Secondary | ICD-10-CM | POA: Diagnosis not present

## 2014-07-07 DIAGNOSIS — H2513 Age-related nuclear cataract, bilateral: Secondary | ICD-10-CM | POA: Diagnosis not present

## 2014-07-07 DIAGNOSIS — H4052X2 Glaucoma secondary to other eye disorders, left eye, moderate stage: Secondary | ICD-10-CM | POA: Diagnosis not present

## 2014-07-07 DIAGNOSIS — H40021 Open angle with borderline findings, high risk, right eye: Secondary | ICD-10-CM | POA: Diagnosis not present

## 2014-07-07 DIAGNOSIS — H35372 Puckering of macula, left eye: Secondary | ICD-10-CM | POA: Diagnosis not present

## 2014-07-08 DIAGNOSIS — M25561 Pain in right knee: Secondary | ICD-10-CM | POA: Diagnosis not present

## 2014-07-08 DIAGNOSIS — S60222D Contusion of left hand, subsequent encounter: Secondary | ICD-10-CM | POA: Diagnosis not present

## 2014-07-08 DIAGNOSIS — S8001XD Contusion of right knee, subsequent encounter: Secondary | ICD-10-CM | POA: Diagnosis not present

## 2014-07-28 DIAGNOSIS — S60222D Contusion of left hand, subsequent encounter: Secondary | ICD-10-CM | POA: Diagnosis not present

## 2014-07-28 DIAGNOSIS — M25561 Pain in right knee: Secondary | ICD-10-CM | POA: Diagnosis not present

## 2014-08-04 DIAGNOSIS — Z01818 Encounter for other preprocedural examination: Secondary | ICD-10-CM | POA: Diagnosis not present

## 2014-08-04 DIAGNOSIS — H2512 Age-related nuclear cataract, left eye: Secondary | ICD-10-CM | POA: Diagnosis not present

## 2014-08-14 DIAGNOSIS — Z6827 Body mass index (BMI) 27.0-27.9, adult: Secondary | ICD-10-CM | POA: Diagnosis not present

## 2014-08-14 DIAGNOSIS — Z83518 Family history of other specified eye disorder: Secondary | ICD-10-CM | POA: Diagnosis not present

## 2014-08-14 DIAGNOSIS — H25812 Combined forms of age-related cataract, left eye: Secondary | ICD-10-CM | POA: Diagnosis not present

## 2014-08-14 DIAGNOSIS — Z86010 Personal history of colonic polyps: Secondary | ICD-10-CM | POA: Diagnosis not present

## 2014-08-14 DIAGNOSIS — Z7982 Long term (current) use of aspirin: Secondary | ICD-10-CM | POA: Diagnosis not present

## 2014-08-14 DIAGNOSIS — E780 Pure hypercholesterolemia: Secondary | ICD-10-CM | POA: Diagnosis not present

## 2014-08-14 DIAGNOSIS — H2513 Age-related nuclear cataract, bilateral: Secondary | ICD-10-CM | POA: Diagnosis not present

## 2014-08-14 DIAGNOSIS — G43909 Migraine, unspecified, not intractable, without status migrainosus: Secondary | ICD-10-CM | POA: Diagnosis not present

## 2014-08-14 DIAGNOSIS — Z823 Family history of stroke: Secondary | ICD-10-CM | POA: Diagnosis not present

## 2014-08-14 DIAGNOSIS — Z9049 Acquired absence of other specified parts of digestive tract: Secondary | ICD-10-CM | POA: Diagnosis not present

## 2014-08-14 DIAGNOSIS — H4052X2 Glaucoma secondary to other eye disorders, left eye, moderate stage: Secondary | ICD-10-CM | POA: Diagnosis not present

## 2014-08-14 DIAGNOSIS — Z8249 Family history of ischemic heart disease and other diseases of the circulatory system: Secondary | ICD-10-CM | POA: Diagnosis not present

## 2014-08-14 DIAGNOSIS — Z809 Family history of malignant neoplasm, unspecified: Secondary | ICD-10-CM | POA: Diagnosis not present

## 2014-08-15 DIAGNOSIS — Z961 Presence of intraocular lens: Secondary | ICD-10-CM | POA: Diagnosis not present

## 2014-08-27 DIAGNOSIS — S8001XD Contusion of right knee, subsequent encounter: Secondary | ICD-10-CM | POA: Diagnosis not present

## 2014-09-02 DIAGNOSIS — Z7982 Long term (current) use of aspirin: Secondary | ICD-10-CM | POA: Diagnosis not present

## 2014-09-02 DIAGNOSIS — M810 Age-related osteoporosis without current pathological fracture: Secondary | ICD-10-CM | POA: Diagnosis not present

## 2014-09-02 DIAGNOSIS — I10 Essential (primary) hypertension: Secondary | ICD-10-CM | POA: Diagnosis not present

## 2014-09-02 DIAGNOSIS — Z0000 Encounter for general adult medical examination without abnormal findings: Secondary | ICD-10-CM | POA: Diagnosis not present

## 2014-09-02 DIAGNOSIS — E780 Pure hypercholesterolemia: Secondary | ICD-10-CM | POA: Diagnosis not present

## 2014-09-08 DIAGNOSIS — H409 Unspecified glaucoma: Secondary | ICD-10-CM | POA: Diagnosis not present

## 2014-09-08 DIAGNOSIS — B370 Candidal stomatitis: Secondary | ICD-10-CM | POA: Diagnosis not present

## 2014-09-08 DIAGNOSIS — E780 Pure hypercholesterolemia: Secondary | ICD-10-CM | POA: Diagnosis not present

## 2014-09-08 DIAGNOSIS — Z1212 Encounter for screening for malignant neoplasm of rectum: Secondary | ICD-10-CM | POA: Diagnosis not present

## 2014-09-08 DIAGNOSIS — I10 Essential (primary) hypertension: Secondary | ICD-10-CM | POA: Diagnosis not present

## 2014-09-29 DIAGNOSIS — H900 Conductive hearing loss, bilateral: Secondary | ICD-10-CM | POA: Diagnosis not present

## 2014-09-29 DIAGNOSIS — H6123 Impacted cerumen, bilateral: Secondary | ICD-10-CM | POA: Diagnosis not present

## 2014-09-29 DIAGNOSIS — H6063 Unspecified chronic otitis externa, bilateral: Secondary | ICD-10-CM | POA: Diagnosis not present

## 2014-11-10 DIAGNOSIS — H40021 Open angle with borderline findings, high risk, right eye: Secondary | ICD-10-CM | POA: Diagnosis not present

## 2014-11-10 DIAGNOSIS — H4052X2 Glaucoma secondary to other eye disorders, left eye, moderate stage: Secondary | ICD-10-CM | POA: Diagnosis not present

## 2014-12-05 DIAGNOSIS — Z961 Presence of intraocular lens: Secondary | ICD-10-CM | POA: Diagnosis not present

## 2014-12-18 DIAGNOSIS — H2511 Age-related nuclear cataract, right eye: Secondary | ICD-10-CM | POA: Diagnosis not present

## 2014-12-18 DIAGNOSIS — H4052X2 Glaucoma secondary to other eye disorders, left eye, moderate stage: Secondary | ICD-10-CM | POA: Diagnosis not present

## 2014-12-18 DIAGNOSIS — H35372 Puckering of macula, left eye: Secondary | ICD-10-CM | POA: Diagnosis not present

## 2014-12-18 DIAGNOSIS — H33302 Unspecified retinal break, left eye: Secondary | ICD-10-CM | POA: Diagnosis not present

## 2014-12-26 ENCOUNTER — Other Ambulatory Visit: Payer: Self-pay

## 2014-12-26 DIAGNOSIS — Z23 Encounter for immunization: Secondary | ICD-10-CM | POA: Diagnosis not present

## 2014-12-26 DIAGNOSIS — Z1231 Encounter for screening mammogram for malignant neoplasm of breast: Secondary | ICD-10-CM

## 2015-01-15 DIAGNOSIS — H6123 Impacted cerumen, bilateral: Secondary | ICD-10-CM | POA: Diagnosis not present

## 2015-01-15 DIAGNOSIS — H9011 Conductive hearing loss, unilateral, right ear, with unrestricted hearing on the contralateral side: Secondary | ICD-10-CM | POA: Diagnosis not present

## 2015-01-26 DIAGNOSIS — M810 Age-related osteoporosis without current pathological fracture: Secondary | ICD-10-CM | POA: Diagnosis not present

## 2015-01-30 DIAGNOSIS — H40021 Open angle with borderline findings, high risk, right eye: Secondary | ICD-10-CM | POA: Diagnosis not present

## 2015-02-02 ENCOUNTER — Ambulatory Visit
Admission: RE | Admit: 2015-02-02 | Discharge: 2015-02-02 | Disposition: A | Discharge status: Home / Self Care | Payer: Medicare Other | Source: Ambulatory Visit

## 2015-02-02 DIAGNOSIS — Z1231 Encounter for screening mammogram for malignant neoplasm of breast: Secondary | ICD-10-CM | POA: Diagnosis not present

## 2015-02-10 DIAGNOSIS — H2511 Age-related nuclear cataract, right eye: Secondary | ICD-10-CM | POA: Diagnosis not present

## 2015-02-10 DIAGNOSIS — H0015 Chalazion left lower eyelid: Secondary | ICD-10-CM | POA: Diagnosis not present

## 2015-02-10 DIAGNOSIS — H35372 Puckering of macula, left eye: Secondary | ICD-10-CM | POA: Diagnosis not present

## 2015-02-10 DIAGNOSIS — H4052X2 Glaucoma secondary to other eye disorders, left eye, moderate stage: Secondary | ICD-10-CM | POA: Diagnosis not present

## 2015-02-10 DIAGNOSIS — H33302 Unspecified retinal break, left eye: Secondary | ICD-10-CM | POA: Diagnosis not present

## 2015-02-13 DIAGNOSIS — H11212 Conjunctival adhesions and strands (localized), left eye: Secondary | ICD-10-CM | POA: Diagnosis not present

## 2015-02-13 DIAGNOSIS — J40 Bronchitis, not specified as acute or chronic: Secondary | ICD-10-CM | POA: Diagnosis not present

## 2015-02-13 DIAGNOSIS — J329 Chronic sinusitis, unspecified: Secondary | ICD-10-CM | POA: Diagnosis not present

## 2015-03-11 DIAGNOSIS — H40021 Open angle with borderline findings, high risk, right eye: Secondary | ICD-10-CM | POA: Diagnosis not present

## 2015-03-11 DIAGNOSIS — H4052X2 Glaucoma secondary to other eye disorders, left eye, moderate stage: Secondary | ICD-10-CM | POA: Diagnosis not present

## 2015-03-31 DIAGNOSIS — N898 Other specified noninflammatory disorders of vagina: Secondary | ICD-10-CM | POA: Diagnosis not present

## 2015-03-31 DIAGNOSIS — N950 Postmenopausal bleeding: Secondary | ICD-10-CM | POA: Diagnosis not present

## 2015-04-01 ENCOUNTER — Other Ambulatory Visit: Payer: Self-pay | Admitting: Internal Medicine

## 2015-04-01 ENCOUNTER — Ambulatory Visit
Admission: RE | Admit: 2015-04-01 | Discharge: 2015-04-01 | Disposition: A | Discharge status: Home / Self Care | Payer: Medicare Other | Source: Ambulatory Visit | Attending: Internal Medicine | Admitting: Internal Medicine

## 2015-04-01 DIAGNOSIS — N950 Postmenopausal bleeding: Secondary | ICD-10-CM

## 2015-04-20 DIAGNOSIS — H01025 Squamous blepharitis left lower eyelid: Secondary | ICD-10-CM | POA: Diagnosis not present

## 2015-04-20 DIAGNOSIS — H01021 Squamous blepharitis right upper eyelid: Secondary | ICD-10-CM | POA: Diagnosis not present

## 2015-04-20 DIAGNOSIS — H01022 Squamous blepharitis right lower eyelid: Secondary | ICD-10-CM | POA: Diagnosis not present

## 2015-04-20 DIAGNOSIS — H40021 Open angle with borderline findings, high risk, right eye: Secondary | ICD-10-CM | POA: Diagnosis not present

## 2015-04-20 DIAGNOSIS — H04123 Dry eye syndrome of bilateral lacrimal glands: Secondary | ICD-10-CM | POA: Diagnosis not present

## 2015-04-20 DIAGNOSIS — H4052X2 Glaucoma secondary to other eye disorders, left eye, moderate stage: Secondary | ICD-10-CM | POA: Diagnosis not present

## 2015-04-20 DIAGNOSIS — H01024 Squamous blepharitis left upper eyelid: Secondary | ICD-10-CM | POA: Diagnosis not present

## 2015-04-28 DIAGNOSIS — N950 Postmenopausal bleeding: Secondary | ICD-10-CM | POA: Diagnosis not present

## 2015-04-28 DIAGNOSIS — N9489 Other specified conditions associated with female genital organs and menstrual cycle: Secondary | ICD-10-CM | POA: Diagnosis not present

## 2015-04-28 DIAGNOSIS — Z124 Encounter for screening for malignant neoplasm of cervix: Secondary | ICD-10-CM | POA: Diagnosis not present

## 2015-04-28 DIAGNOSIS — R938 Abnormal findings on diagnostic imaging of other specified body structures: Secondary | ICD-10-CM | POA: Diagnosis not present

## 2015-05-01 ENCOUNTER — Other Ambulatory Visit: Payer: Self-pay | Admitting: Obstetrics and Gynecology

## 2015-05-05 ENCOUNTER — Encounter (HOSPITAL_COMMUNITY)
Admission: RE | Admit: 2015-05-05 | Discharge: 2015-05-05 | Disposition: A | Discharge status: Home / Self Care | Payer: Medicare Other | Source: Ambulatory Visit | Attending: Obstetrics and Gynecology | Admitting: Obstetrics and Gynecology

## 2015-05-05 ENCOUNTER — Encounter (HOSPITAL_COMMUNITY): Payer: Self-pay

## 2015-05-05 ENCOUNTER — Other Ambulatory Visit: Payer: Self-pay

## 2015-05-05 DIAGNOSIS — Z01812 Encounter for preprocedural laboratory examination: Secondary | ICD-10-CM | POA: Insufficient documentation

## 2015-05-05 DIAGNOSIS — Z79899 Other long term (current) drug therapy: Secondary | ICD-10-CM | POA: Insufficient documentation

## 2015-05-05 DIAGNOSIS — Z01810 Encounter for preprocedural cardiovascular examination: Secondary | ICD-10-CM | POA: Diagnosis not present

## 2015-05-05 DIAGNOSIS — I10 Essential (primary) hypertension: Secondary | ICD-10-CM | POA: Insufficient documentation

## 2015-05-05 HISTORY — DX: Headache, unspecified: R51.9

## 2015-05-05 HISTORY — DX: Unspecified osteoarthritis, unspecified site: M19.90

## 2015-05-05 HISTORY — DX: Headache: R51

## 2015-05-05 LAB — BASIC METABOLIC PANEL
Anion gap: 6 (ref 5–15)
BUN: 17 mg/dL (ref 6–20)
CHLORIDE: 101 mmol/L (ref 101–111)
CO2: 30 mmol/L (ref 22–32)
Calcium: 9.6 mg/dL (ref 8.9–10.3)
Creatinine, Ser: 0.96 mg/dL (ref 0.44–1.00)
GFR calc non Af Amer: 58 mL/min — ABNORMAL LOW (ref >60)
Glucose, Bld: 95 mg/dL (ref 65–99)
POTASSIUM: 3.8 mmol/L (ref 3.5–5.1)
SODIUM: 137 mmol/L (ref 135–145)

## 2015-05-05 LAB — CBC
HEMATOCRIT: 43.2 % (ref 36.0–46.0)
HEMOGLOBIN: 14.5 g/dL (ref 12.0–15.0)
MCH: 29.8 pg (ref 26.0–34.0)
MCHC: 33.6 g/dL (ref 30.0–36.0)
MCV: 88.7 fL (ref 78.0–100.0)
Platelets: 210 10*3/uL (ref 150–400)
RBC: 4.87 MIL/uL (ref 3.87–5.11)
RDW: 14.6 % (ref 11.5–15.5)
WBC: 6.1 10*3/uL (ref 4.0–10.5)

## 2015-05-05 NOTE — Pre-Procedure Instructions (Signed)
Patient had an abnormal EKG today. Dr. Ola Spurr reviewed and no new orders received.

## 2015-05-05 NOTE — Patient Instructions (Addendum)
Your procedure is scheduled on: May 11, 2015    Enter through the Main Entrance of [facility] at: 9:30 am   Pick up the phone at the desk and dial [phone].  Call this number if you have problems the morning of surgery: [phone].  Remember: Do NOT eat food: after midnight on Sunday  Do NOT drink clear liquids after: midnight on Sunday  Take these medicines the morning of surgery with a SIP OF WATER: Inderal LA   Do NOT wear jewelry (body piercing), metal hair clips/bobby pins, make-up, or nail polish. Do NOT wear lotions, powders, or perfumes.  You may wear deoderant. Do NOT shave for 48 hours prior to surgery. Do NOT bring valuables to the hospital. Contacts, dentures, or bridgework may not be worn into surgery.  Have a responsible adult drive you home and stay with you for 24 hours after your procedure

## 2015-05-11 ENCOUNTER — Encounter (HOSPITAL_COMMUNITY)
Admission: RE | Disposition: A | Discharge status: Home / Self Care | Payer: Self-pay | Source: Ambulatory Visit | Attending: Obstetrics and Gynecology

## 2015-05-11 ENCOUNTER — Ambulatory Visit (HOSPITAL_COMMUNITY)
Admission: RE | Admit: 2015-05-11 | Discharge: 2015-05-11 | Disposition: A | Discharge status: Home / Self Care | Payer: Medicare Other | Source: Ambulatory Visit | Attending: Obstetrics and Gynecology | Admitting: Obstetrics and Gynecology

## 2015-05-11 ENCOUNTER — Encounter (HOSPITAL_COMMUNITY): Payer: Self-pay

## 2015-05-11 ENCOUNTER — Ambulatory Visit (HOSPITAL_COMMUNITY): Payer: Medicare Other | Admitting: Anesthesiology

## 2015-05-11 DIAGNOSIS — N9489 Other specified conditions associated with female genital organs and menstrual cycle: Secondary | ICD-10-CM

## 2015-05-11 DIAGNOSIS — N950 Postmenopausal bleeding: Secondary | ICD-10-CM | POA: Diagnosis not present

## 2015-05-11 DIAGNOSIS — N840 Polyp of corpus uteri: Secondary | ICD-10-CM | POA: Insufficient documentation

## 2015-05-11 DIAGNOSIS — N858 Other specified noninflammatory disorders of uterus: Secondary | ICD-10-CM | POA: Diagnosis not present

## 2015-05-11 HISTORY — PX: DILATATION & CURETTAGE/HYSTEROSCOPY WITH MYOSURE: SHX6511

## 2015-05-11 SURGERY — DILATATION & CURETTAGE/HYSTEROSCOPY WITH MYOSURE
Anesthesia: General | Site: Vagina

## 2015-05-11 MED ORDER — CHLOROPROCAINE HCL 1 % IJ SOLN
INTRAMUSCULAR | Status: AC
  Filled 2015-05-11: qty 30

## 2015-05-11 MED ORDER — KETOROLAC TROMETHAMINE 30 MG/ML IJ SOLN
INTRAMUSCULAR | Status: AC
  Filled 2015-05-11: qty 1

## 2015-05-11 MED ORDER — ONDANSETRON HCL 4 MG/2ML IJ SOLN
INTRAMUSCULAR | Status: DC | PRN
  Administered 2015-05-11: 4 mg via INTRAVENOUS

## 2015-05-11 MED ORDER — DEXAMETHASONE SODIUM PHOSPHATE 4 MG/ML IJ SOLN
INTRAMUSCULAR | Status: AC
  Filled 2015-05-11: qty 1

## 2015-05-11 MED ORDER — IBUPROFEN 800 MG PO TABS: 800.0000 mg | ORAL_TABLET | Freq: Three times a day (TID) | ORAL | Status: DC | PRN

## 2015-05-11 MED ORDER — LIDOCAINE HCL (CARDIAC) 20 MG/ML IV SOLN
INTRAVENOUS | Status: DC | PRN
  Administered 2015-05-11: 50 mg via INTRAVENOUS

## 2015-05-11 MED ORDER — LACTATED RINGERS IV SOLN
INTRAVENOUS | Status: DC
  Administered 2015-05-11: 12:00:00 via INTRAVENOUS
  Administered 2015-05-11: 125 mL/h via INTRAVENOUS

## 2015-05-11 MED ORDER — ONDANSETRON HCL 4 MG/2ML IJ SOLN
INTRAMUSCULAR | Status: AC
  Filled 2015-05-11: qty 2

## 2015-05-11 MED ORDER — MEPERIDINE HCL 25 MG/ML IJ SOLN: 6.2500 mg | INTRAMUSCULAR | Status: DC | PRN

## 2015-05-11 MED ORDER — LIDOCAINE HCL (CARDIAC) 20 MG/ML IV SOLN
INTRAVENOUS | Status: AC
  Filled 2015-05-11: qty 5

## 2015-05-11 MED ORDER — FENTANYL CITRATE (PF) 100 MCG/2ML IJ SOLN
INTRAMUSCULAR | Status: DC | PRN
  Administered 2015-05-11: 50 ug via INTRAVENOUS

## 2015-05-11 MED ORDER — MIDAZOLAM HCL 2 MG/2ML IJ SOLN: 0.5000 mg | Freq: Once | INTRAMUSCULAR | Status: DC | PRN

## 2015-05-11 MED ORDER — FENTANYL CITRATE (PF) 100 MCG/2ML IJ SOLN
25.0000 ug | INTRAMUSCULAR | Status: DC | PRN
  Administered 2015-05-11: 25 ug via INTRAVENOUS

## 2015-05-11 MED ORDER — SODIUM CHLORIDE 0.9 % IR SOLN
Status: DC | PRN
  Administered 2015-05-11: 3000 mL

## 2015-05-11 MED ORDER — FENTANYL CITRATE (PF) 100 MCG/2ML IJ SOLN
INTRAMUSCULAR | Status: AC
  Filled 2015-05-11: qty 2

## 2015-05-11 MED ORDER — EPHEDRINE 5 MG/ML INJ
INTRAVENOUS | Status: AC
  Filled 2015-05-11: qty 10

## 2015-05-11 MED ORDER — EPHEDRINE SULFATE 50 MG/ML IJ SOLN
INTRAMUSCULAR | Status: DC | PRN
  Administered 2015-05-11: 5 mg via INTRAVENOUS

## 2015-05-11 MED ORDER — MIDAZOLAM HCL 2 MG/2ML IJ SOLN
INTRAMUSCULAR | Status: AC
  Filled 2015-05-11: qty 2

## 2015-05-11 MED ORDER — PROMETHAZINE HCL 25 MG/ML IJ SOLN: 6.2500 mg | INTRAMUSCULAR | Status: DC | PRN

## 2015-05-11 MED ORDER — PROPOFOL 10 MG/ML IV BOLUS
INTRAVENOUS | Status: DC | PRN
Start: 2015-05-11 — End: 2015-05-11
  Administered 2015-05-11: 140 mg via INTRAVENOUS

## 2015-05-11 MED ORDER — DEXAMETHASONE SODIUM PHOSPHATE 10 MG/ML IJ SOLN
INTRAMUSCULAR | Status: DC | PRN
  Administered 2015-05-11: 4 mg via INTRAVENOUS

## 2015-05-11 MED ORDER — SCOPOLAMINE 1 MG/3DAYS TD PT72: 1.0000 | MEDICATED_PATCH | Freq: Once | TRANSDERMAL | Status: DC

## 2015-05-11 MED ORDER — PROPOFOL 10 MG/ML IV BOLUS
INTRAVENOUS | Status: AC
  Filled 2015-05-11: qty 20

## 2015-05-11 MED ORDER — MIDAZOLAM HCL 2 MG/2ML IJ SOLN
INTRAMUSCULAR | Status: DC | PRN
  Administered 2015-05-11: 1 mg via INTRAVENOUS

## 2015-05-11 MED ORDER — CHLOROPROCAINE HCL 1 % IJ SOLN
INTRAMUSCULAR | Status: DC | PRN
  Administered 2015-05-11: 20 mL

## 2015-05-11 SURGICAL SUPPLY — 20 items
CANISTER SUCT 3000ML (MISCELLANEOUS) ×3 IMPLANT
CATH ROBINSON RED A/P 16FR (CATHETERS) ×3 IMPLANT
CLOTH BEACON ORANGE TIMEOUT ST (SAFETY) ×3 IMPLANT
CONTAINER PREFILL 10% NBF 60ML (FORM) ×6 IMPLANT
DEVICE MYOSURE LITE (MISCELLANEOUS) IMPLANT
DEVICE MYOSURE REACH (MISCELLANEOUS) IMPLANT
ELECT REM PT RETURN 9FT ADLT (ELECTROSURGICAL) ×3
ELECTRODE REM PT RTRN 9FT ADLT (ELECTROSURGICAL) ×1 IMPLANT
FILTER ARTHROSCOPY CONVERTOR (FILTER) ×3 IMPLANT
GLOVE BIOGEL PI IND STRL 7.0 (GLOVE) ×3 IMPLANT
GLOVE BIOGEL PI INDICATOR 7.0 (GLOVE) ×6
GLOVE ECLIPSE 6.5 STRL STRAW (GLOVE) ×3 IMPLANT
GOWN STRL REUS W/TWL LRG LVL3 (GOWN DISPOSABLE) ×6 IMPLANT
PACK VAGINAL MINOR WOMEN LF (CUSTOM PROCEDURE TRAY) ×3 IMPLANT
PAD OB MATERNITY 4.3X12.25 (PERSONAL CARE ITEMS) ×3 IMPLANT
SEAL ROD LENS SCOPE MYOSURE (ABLATOR) ×3 IMPLANT
TOWEL OR 17X24 6PK STRL BLUE (TOWEL DISPOSABLE) ×6 IMPLANT
TUBING AQUILEX INFLOW (TUBING) ×3 IMPLANT
TUBING AQUILEX OUTFLOW (TUBING) ×3 IMPLANT
WATER STERILE IRR 1000ML POUR (IV SOLUTION) ×3 IMPLANT

## 2015-05-11 NOTE — Discharge Instructions (Signed)
DISCHARGE INSTRUCTIONS: D&C / D&E The following instructions have been prepared to help you care for yourself upon your return home.   Personal hygiene:  Use sanitary pads for vaginal drainage, not tampons.  Shower the day after your procedure.  NO tub baths, pools or Jacuzzis for 2-3 weeks.  Wipe front to back after using the bathroom.  Activity and limitations:  Do NOT drive or operate any equipment for 24 hours. The effects of anesthesia are still present and drowsiness may result.  Do NOT rest in bed all day.  Walking is encouraged.  Walk up and down stairs slowly.  You may resume your normal activity in one to two days or as indicated by your physician.  Sexual activity: NO intercourse for at least 1weeks after the procedure, or as indicated by your physician.  Diet: Eat a light meal as desired this evening. You may resume your usual diet tomorrow.  Return to work: You may resume your work activities in one to two days or as indicated by your doctor.  What to expect after your surgery: Expect to have vaginal bleeding/discharge for 2-3 days and spotting for up to 10 days. It is not unusual to have soreness for up to 1-2 weeks. You may have a slight burning sensation when you urinate for the first day. Mild cramps may continue for a couple of days. You may have a regular period in 2-6 weeks.  Call your doctor for any of the following:  Excessive vaginal bleeding, saturating and changing one pad every hour.  Inability to urinate 6 hours after discharge from hospital.  Pain not relieved by pain medication.  Fever of 100.4 F or greater.  Unusual vaginal discharge or odor.   Call for an appointment:    Patients signature: ______________________  Nurses signature ________________________  Support person's signature_______________________

## 2015-05-11 NOTE — Anesthesia Postprocedure Evaluation (Signed)
Anesthesia Post Note  Patient: [name]  Procedure(s) Performed: Procedure(s) (LRB): DILATATION & CURETTAGE/HYSTEROSCOPY WITH MYOSURE POLYPECTOMY (N/A)  Patient location during evaluation: PACU Anesthesia Type: General Level of consciousness: awake and alert, oriented and patient cooperative Pain management: pain level controlled Vital Signs Assessment: post-procedure vital signs reviewed and stable Respiratory status: spontaneous breathing, nonlabored ventilation and respiratory function stable Cardiovascular status: blood pressure returned to baseline and stable Postop Assessment: no signs of nausea or vomiting Anesthetic complications: no    Last Vitals:  Filed Vitals:   05/11/15 1230 05/11/15 1237  BP: 133/76   Pulse: 61 57  Temp:    Resp: 14 4    Last Pain: There were no vitals filed for this visit.               Midge Minium

## 2015-05-11 NOTE — Transfer of Care (Signed)
Immediate Anesthesia Transfer of Care Note  Patient: [name]  Procedure(s) Performed: Procedure(s): DILATATION & CURETTAGE/HYSTEROSCOPY WITH MYOSURE (N/A)  Patient Location: PACU  Anesthesia Type:General  Level of Consciousness: awake, alert  and oriented  Airway & Oxygen Therapy: Patient Spontanous Breathing and Patient connected to nasal cannula oxygen  Post-op Assessment: Report given to RN, Post -op Vital signs reviewed and stable and Patient moving all extremities  Post vital signs: Reviewed and stable  Last Vitals:  Filed Vitals:   05/11/15 0943  BP: 171/82  Pulse: 59  Temp: 36.4 C  Resp: 20    Complications: No apparent anesthesia complications

## 2015-05-11 NOTE — Anesthesia Procedure Notes (Signed)
Procedure Name: LMA Insertion Date/Time: 05/11/2015 10:59 AM Performed by: Hewitt Blade Pre-anesthesia Checklist: Patient identified, Emergency Drugs available, Suction available and Patient being monitored Patient Re-evaluated:Patient Re-evaluated prior to inductionOxygen Delivery Method: Circle system utilized Preoxygenation: Pre-oxygenation with 100% oxygen Intubation Type: IV induction LMA: LMA inserted LMA Size: 4.0 Number of attempts: 1 Placement Confirmation: breath sounds checked- equal and bilateral Tube secured with: Tape Dental Injury: Teeth and Oropharynx as per pre-operative assessment

## 2015-05-11 NOTE — Anesthesia Preprocedure Evaluation (Addendum)
Anesthesia Evaluation  Patient identified by MRN, date of birth, ID band Patient awake    Reviewed: Allergy & Precautions, NPO status , Patient's Chart, lab work & pertinent test results, reviewed documented beta blocker date and time   History of Anesthesia Complications Negative for: history of anesthetic complications  Airway Mallampati: III  TM Distance: >3 FB Neck ROM: Full    Dental  (+) Dental Advisory Given, Chipped   Pulmonary    breath sounds clear to auscultation       Cardiovascular hypertension, Pt. on medications and Pt. on home beta blockers (-) angina Rhythm:Regular Rate:Normal  '15 ECHO: EF 60-65%, mild AI   Neuro/Psych  Headaches (controlled with inderal),    GI/Hepatic negative GI ROS, Neg liver ROS,   Endo/Other  negative endocrine ROS  Renal/GU negative Renal ROS     Musculoskeletal   Abdominal   Peds  Hematology negative hematology ROS (+)   Anesthesia Other Findings   Reproductive/Obstetrics                           Anesthesia Physical Anesthesia Plan  ASA: II  Anesthesia Plan: General   Post-op Pain Management:    Induction: Intravenous  Airway Management Planned: LMA  Additional Equipment:   Intra-op Plan:   Post-operative Plan:   Informed Consent: I have reviewed the patients History and Physical, chart, labs and discussed the procedure including the risks, benefits and alternatives for the proposed anesthesia with the patient or authorized representative who has indicated his/her understanding and acceptance.   Dental advisory given  Plan Discussed with: CRNA and Surgeon  Anesthesia Plan Comments: (Plan routine monitors, GA- LMA OK)        Anesthesia Quick Evaluation

## 2015-05-11 NOTE — Brief Op Note (Signed)
05/11/2015  12:12 PM  PATIENT:  Waynard Reeds Roller  75 y.o. female  PRE-OPERATIVE DIAGNOSIS:  Postmenopausal Bleeding, Endometrial Mass  POST-OPERATIVE DIAGNOSIS:  postmenopausal bleeding, endometrial mass  PROCEDURE:  Diagnostic hysteroscopy, hysteroscopic resection of endometrial polyps using myosure, dilation and curettage  SURGEON:  Surgeon(s) and Role:    * Servando Salina, MD - Primary  PHYSICIAN ASSISTANT:   ASSISTANTS: none   ANESTHESIA:   general and paracervical block Findings: 2 polypoid lesions, atrophic endometrium, nl endocervical canal EBL:  Total I/O In: 1000 [I.V.:1000] Out: 50 [Urine:50]  BLOOD ADMINISTERED:none  DRAINS: none   LOCAL MEDICATIONS USED:  OTHER nesicaine  SPECIMEN:  Source of Specimen:  emc with endometrial polyps  DISPOSITION OF SPECIMEN:  PATHOLOGY  COUNTS:  YES  TOURNIQUET:  * No tourniquets in log *  DICTATION: .Other Dictation: Dictation Number [id number]  PLAN OF CARE: Discharge to home after PACU  PATIENT DISPOSITION:  PACU - hemodynamically stable.   Delay start of Pharmacological VTE agent (>24hrs) due to surgical blood loss or risk of bleeding: no

## 2015-05-11 NOTE — Op Note (Signed)
NAMEMERCI, SKILLING              ACCOUNT NO.:  [id number]  MEDICAL RECORD NO.:  [id number]  LOCATION:  [facility]                          FACILITY:  [facility]  PHYSICIAN:  Servando Salina, M.D.DATE OF BIRTH:  30-Oct-1940  DATE OF PROCEDURE:  05/11/2015 DATE OF DISCHARGE:  05/11/2015                              OPERATIVE REPORT   PREOPERATIVE DIAGNOSES:  Postmenopausal bleeding, Endometrial masses.  PROCEDURES:  Diagnostic hysteroscopy, hysteroscopic resection of endometrial masses using the MyoSure, dilation and curettage.  POSTOPERATIVE DIAGNOSES:  Postmenopausal bleeding, Endometrial masses.  ANESTHESIA:  General, paracervical block.  SURGEON:  Servando Salina, M.D.  ASSISTANT:  None.  DESCRIPTION OF PROCEDURE:  Under adequate general anesthesia, the patient was placed in a dorsal lithotomy position.  She was sterilely prepped and draped in usual fashion.  Bladder was catheterized mild amount of urine.  Examination under anesthesia revealed anteverted uterus.  No adnexal masses could be appreciated.  Bivalve speculum was put in the vagina.  Atrophic, pale vaginal mucosa was noted.  The cervix which was parous was grasped with a single-tooth tenaculum of the anterior lip and 20 mL of 1% Nesacaine was injected paracervically at the 3 and 9 o'clock position.  The cervix was then carefully dilated up to #21 and ultimately #23 [facility] dilator.  The MyoSure apparatus was inserted into the uterine cavity.  Sclerosed tubal ostia were noted, right much greater than the left.  Polypoid lesions were ted in the right posterior and left lateral walls. replacing the apparatus with the working instrument of the reach.  The both polypoid lesions were removed and a small submucosal lesion was also removed.  The endometrium was gently resected using the MyoSure apparatus as well.  When all polypoid lesions had been removed, the endocervical canal was inspected, no lesions noted.  The MyoSure  apparatus was then removed.  The cavity was then curetted for scant amount of tissue, then, all instruments were removed from the vagina.  SPECIMEN:  Labeled endometrial polyp with endometrial curetting was sent to Pathology.  ESTIMATED BLOOD LOSS:  Minimal.  FLUID DEFICIT:  350 cc.  COMPLICATION:  None.  The patient tolerated the procedure well, was transferred to the recovery room in stable condition.     Servando Salina, M.D.     [employer] [place]/MED[facility]  D:  05/11/2015  T:  05/11/2015  Job:  [id number]

## 2015-05-12 ENCOUNTER — Encounter (HOSPITAL_COMMUNITY): Payer: Self-pay | Admitting: Obstetrics and Gynecology

## 2015-06-11 DIAGNOSIS — I788 Other diseases of capillaries: Secondary | ICD-10-CM | POA: Diagnosis not present

## 2015-06-11 DIAGNOSIS — L821 Other seborrheic keratosis: Secondary | ICD-10-CM | POA: Diagnosis not present

## 2015-07-15 DIAGNOSIS — H2511 Age-related nuclear cataract, right eye: Secondary | ICD-10-CM | POA: Diagnosis not present

## 2015-07-15 DIAGNOSIS — Z961 Presence of intraocular lens: Secondary | ICD-10-CM | POA: Diagnosis not present

## 2015-07-28 DIAGNOSIS — H2511 Age-related nuclear cataract, right eye: Secondary | ICD-10-CM | POA: Diagnosis not present

## 2015-07-28 DIAGNOSIS — Z7982 Long term (current) use of aspirin: Secondary | ICD-10-CM | POA: Diagnosis not present

## 2015-07-28 DIAGNOSIS — Z79899 Other long term (current) drug therapy: Secondary | ICD-10-CM | POA: Diagnosis not present

## 2015-07-28 DIAGNOSIS — Z01818 Encounter for other preprocedural examination: Secondary | ICD-10-CM | POA: Diagnosis not present

## 2015-07-28 DIAGNOSIS — R001 Bradycardia, unspecified: Secondary | ICD-10-CM | POA: Diagnosis not present

## 2015-08-06 DIAGNOSIS — Z79899 Other long term (current) drug therapy: Secondary | ICD-10-CM | POA: Diagnosis not present

## 2015-08-06 DIAGNOSIS — Z7982 Long term (current) use of aspirin: Secondary | ICD-10-CM | POA: Diagnosis not present

## 2015-08-06 DIAGNOSIS — I10 Essential (primary) hypertension: Secondary | ICD-10-CM | POA: Diagnosis not present

## 2015-08-06 DIAGNOSIS — H4010X4 Unspecified open-angle glaucoma, indeterminate stage: Secondary | ICD-10-CM | POA: Diagnosis not present

## 2015-08-06 DIAGNOSIS — G43909 Migraine, unspecified, not intractable, without status migrainosus: Secondary | ICD-10-CM | POA: Diagnosis not present

## 2015-08-06 DIAGNOSIS — H04123 Dry eye syndrome of bilateral lacrimal glands: Secondary | ICD-10-CM | POA: Diagnosis not present

## 2015-08-06 DIAGNOSIS — R001 Bradycardia, unspecified: Secondary | ICD-10-CM | POA: Diagnosis not present

## 2015-08-06 DIAGNOSIS — H40021 Open angle with borderline findings, high risk, right eye: Secondary | ICD-10-CM | POA: Diagnosis not present

## 2015-08-06 DIAGNOSIS — Z961 Presence of intraocular lens: Secondary | ICD-10-CM | POA: Diagnosis not present

## 2015-08-06 DIAGNOSIS — Z885 Allergy status to narcotic agent status: Secondary | ICD-10-CM | POA: Diagnosis not present

## 2015-08-06 DIAGNOSIS — B399 Histoplasmosis, unspecified: Secondary | ICD-10-CM | POA: Diagnosis not present

## 2015-08-06 DIAGNOSIS — H0015 Chalazion left lower eyelid: Secondary | ICD-10-CM | POA: Diagnosis not present

## 2015-08-06 DIAGNOSIS — Z9842 Cataract extraction status, left eye: Secondary | ICD-10-CM | POA: Diagnosis not present

## 2015-08-06 DIAGNOSIS — H25811 Combined forms of age-related cataract, right eye: Secondary | ICD-10-CM | POA: Diagnosis not present

## 2015-08-06 DIAGNOSIS — E785 Hyperlipidemia, unspecified: Secondary | ICD-10-CM | POA: Diagnosis not present

## 2015-08-06 DIAGNOSIS — Z9049 Acquired absence of other specified parts of digestive tract: Secondary | ICD-10-CM | POA: Diagnosis not present

## 2015-09-14 DIAGNOSIS — H40021 Open angle with borderline findings, high risk, right eye: Secondary | ICD-10-CM | POA: Diagnosis not present

## 2015-09-14 DIAGNOSIS — H4052X2 Glaucoma secondary to other eye disorders, left eye, moderate stage: Secondary | ICD-10-CM | POA: Diagnosis not present

## 2015-09-16 DIAGNOSIS — N390 Urinary tract infection, site not specified: Secondary | ICD-10-CM | POA: Diagnosis not present

## 2015-09-16 DIAGNOSIS — R001 Bradycardia, unspecified: Secondary | ICD-10-CM | POA: Diagnosis not present

## 2015-09-16 DIAGNOSIS — I10 Essential (primary) hypertension: Secondary | ICD-10-CM | POA: Diagnosis not present

## 2015-09-16 DIAGNOSIS — M8580 Other specified disorders of bone density and structure, unspecified site: Secondary | ICD-10-CM | POA: Diagnosis not present

## 2015-09-16 DIAGNOSIS — E7800 Pure hypercholesterolemia, unspecified: Secondary | ICD-10-CM | POA: Diagnosis not present

## 2015-09-16 DIAGNOSIS — G43909 Migraine, unspecified, not intractable, without status migrainosus: Secondary | ICD-10-CM | POA: Diagnosis not present

## 2015-09-16 DIAGNOSIS — Z0000 Encounter for general adult medical examination without abnormal findings: Secondary | ICD-10-CM | POA: Diagnosis not present

## 2015-09-18 DIAGNOSIS — M8580 Other specified disorders of bone density and structure, unspecified site: Secondary | ICD-10-CM | POA: Diagnosis not present

## 2015-09-18 DIAGNOSIS — E559 Vitamin D deficiency, unspecified: Secondary | ICD-10-CM | POA: Diagnosis not present

## 2015-09-29 DIAGNOSIS — G43109 Migraine with aura, not intractable, without status migrainosus: Secondary | ICD-10-CM | POA: Diagnosis not present

## 2015-09-29 DIAGNOSIS — H5702 Anisocoria: Secondary | ICD-10-CM | POA: Diagnosis not present

## 2015-09-29 DIAGNOSIS — B353 Tinea pedis: Secondary | ICD-10-CM | POA: Diagnosis not present

## 2015-09-29 DIAGNOSIS — H409 Unspecified glaucoma: Secondary | ICD-10-CM | POA: Diagnosis not present

## 2015-09-29 DIAGNOSIS — I10 Essential (primary) hypertension: Secondary | ICD-10-CM | POA: Diagnosis not present

## 2015-11-11 ENCOUNTER — Other Ambulatory Visit: Payer: Self-pay

## 2015-12-09 DIAGNOSIS — H6123 Impacted cerumen, bilateral: Secondary | ICD-10-CM | POA: Diagnosis not present

## 2015-12-25 DIAGNOSIS — Z23 Encounter for immunization: Secondary | ICD-10-CM | POA: Diagnosis not present

## 2015-12-28 ENCOUNTER — Other Ambulatory Visit: Payer: Self-pay | Admitting: Internal Medicine

## 2015-12-28 DIAGNOSIS — Z1231 Encounter for screening mammogram for malignant neoplasm of breast: Secondary | ICD-10-CM

## 2016-01-11 DIAGNOSIS — K625 Hemorrhage of anus and rectum: Secondary | ICD-10-CM | POA: Diagnosis not present

## 2016-01-12 DIAGNOSIS — K625 Hemorrhage of anus and rectum: Secondary | ICD-10-CM | POA: Diagnosis not present

## 2016-01-12 DIAGNOSIS — K5130 Ulcerative (chronic) rectosigmoiditis without complications: Secondary | ICD-10-CM | POA: Diagnosis not present

## 2016-01-14 DIAGNOSIS — K5130 Ulcerative (chronic) rectosigmoiditis without complications: Secondary | ICD-10-CM | POA: Diagnosis not present

## 2016-01-26 DIAGNOSIS — H4052X2 Glaucoma secondary to other eye disorders, left eye, moderate stage: Secondary | ICD-10-CM | POA: Diagnosis not present

## 2016-01-26 DIAGNOSIS — H40021 Open angle with borderline findings, high risk, right eye: Secondary | ICD-10-CM | POA: Diagnosis not present

## 2016-02-03 ENCOUNTER — Ambulatory Visit
Admission: RE | Admit: 2016-02-03 | Discharge: 2016-02-03 | Disposition: A | Discharge status: Home / Self Care | Payer: Medicare Other | Source: Ambulatory Visit | Attending: Internal Medicine | Admitting: Internal Medicine

## 2016-02-03 DIAGNOSIS — Z1231 Encounter for screening mammogram for malignant neoplasm of breast: Secondary | ICD-10-CM

## 2016-03-21 DIAGNOSIS — K51311 Ulcerative (chronic) rectosigmoiditis with rectal bleeding: Secondary | ICD-10-CM | POA: Diagnosis not present

## 2016-04-06 DIAGNOSIS — H40021 Open angle with borderline findings, high risk, right eye: Secondary | ICD-10-CM | POA: Diagnosis not present

## 2016-04-06 DIAGNOSIS — H4052X2 Glaucoma secondary to other eye disorders, left eye, moderate stage: Secondary | ICD-10-CM | POA: Diagnosis not present

## 2016-05-10 DIAGNOSIS — H6123 Impacted cerumen, bilateral: Secondary | ICD-10-CM | POA: Diagnosis not present

## 2016-05-10 DIAGNOSIS — H93293 Other abnormal auditory perceptions, bilateral: Secondary | ICD-10-CM | POA: Diagnosis not present

## 2016-05-10 DIAGNOSIS — H60543 Acute eczematoid otitis externa, bilateral: Secondary | ICD-10-CM | POA: Diagnosis not present

## 2016-05-13 DIAGNOSIS — H26491 Other secondary cataract, right eye: Secondary | ICD-10-CM | POA: Diagnosis not present

## 2016-06-13 DIAGNOSIS — Z01419 Encounter for gynecological examination (general) (routine) without abnormal findings: Secondary | ICD-10-CM | POA: Diagnosis not present

## 2016-06-13 DIAGNOSIS — Z6824 Body mass index (BMI) 24.0-24.9, adult: Secondary | ICD-10-CM | POA: Diagnosis not present

## 2016-06-13 DIAGNOSIS — Z124 Encounter for screening for malignant neoplasm of cervix: Secondary | ICD-10-CM | POA: Diagnosis not present

## 2016-06-21 DIAGNOSIS — J069 Acute upper respiratory infection, unspecified: Secondary | ICD-10-CM | POA: Diagnosis not present

## 2016-06-27 DIAGNOSIS — K51311 Ulcerative (chronic) rectosigmoiditis with rectal bleeding: Secondary | ICD-10-CM | POA: Diagnosis not present

## 2016-10-05 DIAGNOSIS — Z0000 Encounter for general adult medical examination without abnormal findings: Secondary | ICD-10-CM | POA: Diagnosis not present

## 2016-10-05 DIAGNOSIS — I10 Essential (primary) hypertension: Secondary | ICD-10-CM | POA: Diagnosis not present

## 2016-10-05 DIAGNOSIS — Z7982 Long term (current) use of aspirin: Secondary | ICD-10-CM | POA: Diagnosis not present

## 2016-10-05 DIAGNOSIS — E7800 Pure hypercholesterolemia, unspecified: Secondary | ICD-10-CM | POA: Diagnosis not present

## 2016-10-05 DIAGNOSIS — M810 Age-related osteoporosis without current pathological fracture: Secondary | ICD-10-CM | POA: Diagnosis not present

## 2016-10-07 DIAGNOSIS — K5130 Ulcerative (chronic) rectosigmoiditis without complications: Secondary | ICD-10-CM | POA: Diagnosis not present

## 2016-10-11 ENCOUNTER — Other Ambulatory Visit: Payer: Self-pay | Admitting: Internal Medicine

## 2016-10-11 DIAGNOSIS — Z8659 Personal history of other mental and behavioral disorders: Secondary | ICD-10-CM | POA: Diagnosis not present

## 2016-10-11 DIAGNOSIS — M810 Age-related osteoporosis without current pathological fracture: Secondary | ICD-10-CM | POA: Diagnosis not present

## 2016-10-11 DIAGNOSIS — J309 Allergic rhinitis, unspecified: Secondary | ICD-10-CM | POA: Diagnosis not present

## 2016-10-11 DIAGNOSIS — N644 Mastodynia: Secondary | ICD-10-CM

## 2016-10-11 DIAGNOSIS — R55 Syncope and collapse: Secondary | ICD-10-CM | POA: Diagnosis not present

## 2016-10-14 ENCOUNTER — Ambulatory Visit
Admission: RE | Admit: 2016-10-14 | Discharge: 2016-10-14 | Disposition: A | Discharge status: Home / Self Care | Payer: Medicare Other | Source: Ambulatory Visit | Attending: Internal Medicine | Admitting: Internal Medicine

## 2016-10-14 DIAGNOSIS — R928 Other abnormal and inconclusive findings on diagnostic imaging of breast: Secondary | ICD-10-CM | POA: Diagnosis not present

## 2016-10-14 DIAGNOSIS — N6489 Other specified disorders of breast: Secondary | ICD-10-CM | POA: Diagnosis not present

## 2016-10-14 DIAGNOSIS — N644 Mastodynia: Secondary | ICD-10-CM

## 2016-11-11 DIAGNOSIS — H4052X2 Glaucoma secondary to other eye disorders, left eye, moderate stage: Secondary | ICD-10-CM | POA: Diagnosis not present

## 2016-11-11 DIAGNOSIS — H40021 Open angle with borderline findings, high risk, right eye: Secondary | ICD-10-CM | POA: Diagnosis not present

## 2016-11-11 DIAGNOSIS — I10 Essential (primary) hypertension: Secondary | ICD-10-CM | POA: Diagnosis not present

## 2016-11-11 DIAGNOSIS — R55 Syncope and collapse: Secondary | ICD-10-CM | POA: Diagnosis not present

## 2016-12-07 DIAGNOSIS — D225 Melanocytic nevi of trunk: Secondary | ICD-10-CM | POA: Diagnosis not present

## 2016-12-07 DIAGNOSIS — L820 Inflamed seborrheic keratosis: Secondary | ICD-10-CM | POA: Diagnosis not present

## 2016-12-07 DIAGNOSIS — D485 Neoplasm of uncertain behavior of skin: Secondary | ICD-10-CM | POA: Diagnosis not present

## 2016-12-08 DIAGNOSIS — H4052X2 Glaucoma secondary to other eye disorders, left eye, moderate stage: Secondary | ICD-10-CM | POA: Diagnosis not present

## 2016-12-08 DIAGNOSIS — H40021 Open angle with borderline findings, high risk, right eye: Secondary | ICD-10-CM | POA: Diagnosis not present

## 2016-12-20 DIAGNOSIS — H6123 Impacted cerumen, bilateral: Secondary | ICD-10-CM | POA: Diagnosis not present

## 2016-12-23 DIAGNOSIS — I10 Essential (primary) hypertension: Secondary | ICD-10-CM | POA: Diagnosis not present

## 2016-12-23 DIAGNOSIS — Z23 Encounter for immunization: Secondary | ICD-10-CM | POA: Diagnosis not present

## 2016-12-28 ENCOUNTER — Other Ambulatory Visit: Payer: Self-pay | Admitting: Internal Medicine

## 2016-12-28 DIAGNOSIS — Z1231 Encounter for screening mammogram for malignant neoplasm of breast: Secondary | ICD-10-CM

## 2017-02-06 ENCOUNTER — Ambulatory Visit
Admission: RE | Admit: 2017-02-06 | Discharge: 2017-02-06 | Disposition: A | Discharge status: Home / Self Care | Payer: Medicare Other | Source: Ambulatory Visit | Attending: Internal Medicine | Admitting: Internal Medicine

## 2017-02-06 DIAGNOSIS — Z1231 Encounter for screening mammogram for malignant neoplasm of breast: Secondary | ICD-10-CM | POA: Diagnosis not present

## 2017-03-13 ENCOUNTER — Other Ambulatory Visit: Payer: Self-pay

## 2017-03-13 ENCOUNTER — Emergency Department (HOSPITAL_COMMUNITY)
Admission: EM | Admit: 2017-03-13 | Discharge: 2017-03-13 | Disposition: A | Discharge status: Home / Self Care | Payer: Medicare Other | Attending: Emergency Medicine | Admitting: Emergency Medicine

## 2017-03-13 ENCOUNTER — Encounter (HOSPITAL_COMMUNITY): Payer: Self-pay

## 2017-03-13 ENCOUNTER — Emergency Department (HOSPITAL_COMMUNITY): Payer: Medicare Other

## 2017-03-13 DIAGNOSIS — R220 Localized swelling, mass and lump, head: Secondary | ICD-10-CM

## 2017-03-13 DIAGNOSIS — Z79899 Other long term (current) drug therapy: Secondary | ICD-10-CM | POA: Insufficient documentation

## 2017-03-13 DIAGNOSIS — I10 Essential (primary) hypertension: Secondary | ICD-10-CM | POA: Diagnosis not present

## 2017-03-13 DIAGNOSIS — Z7982 Long term (current) use of aspirin: Secondary | ICD-10-CM | POA: Insufficient documentation

## 2017-03-13 MED ORDER — FAMOTIDINE 20 MG PO TABS
20.0000 mg | ORAL_TABLET | Freq: Once | ORAL | Status: AC
  Administered 2017-03-13: 20 mg via ORAL
  Filled 2017-03-13: qty 1

## 2017-03-13 MED ORDER — DIPHENHYDRAMINE HCL 25 MG PO CAPS
25.0000 mg | ORAL_CAPSULE | Freq: Once | ORAL | Status: AC
  Administered 2017-03-13: 25 mg via ORAL
  Filled 2017-03-13: qty 1

## 2017-03-13 NOTE — ED Notes (Signed)
Patient reports noticing bottom lip swelling on Friday after taking Abreva for the cold sore. She reports the pharmacist told her to take Benadryl which she did and the swelling had gone down significantly as of Sunday, however this morning when she woke up she noticed some swelling int he top lips and she reports her cheeks feel tight. Patient cheeks are pink and top lip is slightly swollen. Patient reports the swelling was 2x as worse on Friday and Saturday. Reports that she stopped the Abreva immediately and her PCP started her on acyclovir on yesterday and she took 3 doses without difficulty. Patient denies any respiratory distress or pain. States she just feels the tightness. She is able to talk in full sentences without respiratory distress.

## 2017-03-13 NOTE — ED Triage Notes (Signed)
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 Pt reports onset of facial swelling and tightness. No angioedema noted. Uvula is visualized. No tongue swelling observed. Pt maintain secretions, speaking in complete sentences w/ unlabored breathing.

## 2017-03-13 NOTE — Discharge Instructions (Signed)
Do not use the Abreva again since it appears that you are allergic to it.  To treat allergy symptoms use Benadryl 25 mg 4 times a day, and Pepcid 20 mg twice a day, for another 4 or 5 days.  Return here if you feel like the swelling is worsened or you develop other symptoms.

## 2017-03-13 NOTE — ED Notes (Signed)
Dr. Eulis Foster notified MRI not available until after 1pm.

## 2017-03-13 NOTE — ED Notes (Signed)
Patient transported to MRI 

## 2017-03-13 NOTE — ED Provider Notes (Signed)
[facility] DEPT Provider Note   CSN: [id number] Arrival date & time: 03/13/17  4098     History   Chief Complaint Chief Complaint  Patient presents with  . Facial Swelling    HPI [name] is a 76 y.o. female.  She presents for evaluation of a sensation of "swelling and tingling," of the left cheek.  The symptoms occurred this morning.  3 days ago she had swelling of the lower lip, for which she took Benadryl with improvement.  She has also had a cold sore of the right angle of the lips, for 1 week.  Her PCP started her on acyclovir yesterday for herpes.  Prior to that she had used Abreva, 4 days ago to treat the cold sore, after which she developed swelling of the lower lip.  She denies headache, focal weakness, dizziness, chest pain, or problems walking.  No prior similar problems.  There are no other known modifying factors.   HPI  Past Medical History:  Diagnosis Date  . Abnormal EKG    LVH with repolarization changes  . Arthritis   . Headache    migraines   . Hyperlipidemia   . Hyperlipidemia   . Visual disorder    Torn retina    Patient Active Problem List   Diagnosis Date Noted  . Essential hypertension 08/08/2013  . Hyperlipidemia 08/08/2013  . Abnormal EKG 08/08/2013    Past Surgical History:  Procedure Laterality Date  . APPENDECTOMY    . COLON SURGERY    . DILATATION & CURETTAGE/HYSTEROSCOPY WITH MYOSURE N/A 05/11/2015   Procedure: DILATATION & CURETTAGE/HYSTEROSCOPY WITH MYOSURE POLYPECTOMY;  Surgeon: Servando Salina, MD;  Location: [facility];  Service: Gynecology;  Laterality: N/A;  . EYE SURGERY    . SHOULDER ARTHROSCOPY    . TONSILLECTOMY      OB History    No data available       Home Medications    Prior to Admission medications   Medication Sig Start Date End Date Taking? Authorizing Provider  acetaminophen (TYLENOL) 500 MG tablet Take 1,000 mg by mouth every 6 (six) hours as needed for headache.    Yes [provider]  acyclovir (ZOVIRAX) 400 MG tablet Take 400 mg by mouth 3 (three) times daily. 03/12/17  Yes [provider]  aspirin EC 81 MG tablet Take 81 mg by mouth daily.   Yes [provider]  atenolol (TENORMIN) 25 MG tablet Take 25 mg by mouth daily.   Yes [provider]  atorvastatin (LIPITOR) 20 MG tablet Take 20 mg by mouth at bedtime.    Yes [provider]  Calcium Carbonate-Vit D-Min (CALCIUM 1200 PO) Take 1 tablet by mouth daily.   Yes [provider]  cholecalciferol (VITAMIN D) 1000 UNITS tablet Take 1,000 Units by mouth daily.   Yes [provider]  dorzolamide-timolol (COSOPT) 22.3-6.8 MG/ML ophthalmic solution Place 1 drop into the left eye 2 (two) times daily.   Yes [provider]  Multiple Vitamins-Minerals (ICAPS MV PO) Take 1 tablet by mouth 2 (two) times daily.   Yes [provider]  Polyethyl Glycol-Propyl Glycol 0.4-0.3 % SOLN Place 1 drop into the right eye as needed (For dry eyes.).    Yes [provider]  rizatriptan (MAXALT-MLT) 10 MG disintegrating tablet Take 1 tablet by mouth daily as needed for migraine.  07/08/13  Yes [provider]  triamterene-hydrochlorothiazide (MAXZIDE-25) 37.5-25 MG per tablet Take 0.5 tablets by mouth as  needed (For swelling.).  07/01/13  Yes [provider]    Family History Family History  Problem Relation Age of Onset  . Stroke Mother 11  . Cancer Mother 14       Breast cancer  . Hypertension Mother   . Breast cancer Mother   . Heart disease Father   . Hypertension Father   . Stroke Father 67  . Heart disease Brother 55  . Cancer Brother        Lung cancer  . Cancer Maternal Grandmother        Breast cancer  . Breast cancer Maternal Grandmother   . Kidney disease Maternal Grandfather   . Diabetes Paternal Grandmother   . Hyperlipidemia Child   . Breast cancer Maternal Aunt     Social History Social  History   Tobacco Use  . Smoking status: Never Smoker  . Smokeless tobacco: Never Used  Substance Use Topics  . Alcohol use: No  . Drug use: No     Allergies   Abreva [docosanol] and Codeine   Review of Systems Review of Systems  All other systems reviewed and are negative.    Physical Exam Updated Vital Signs BP (!) 149/79 (BP Location: Left Arm)   Pulse 60   Temp 98.3 F (36.8 C)   Resp 18   SpO2 99%   Physical Exam  Constitutional: She is oriented to person, place, and time. She appears well-developed and well-nourished. No distress.  Elderly, frail  HENT:  Head: Normocephalic and atraumatic.  Very minimal swelling left upper lip, and left cheek region, without deformity, tenderness, erythema or focal skin lesion.  Superficial ulceration right angle of the lips, possibly consistent with herpetic lesion.  No drainage or bleeding from the lip wound.  No trismus.  Eyes: Conjunctivae and EOM are normal. Pupils are equal, round, and reactive to light.  Neck: Normal range of motion and phonation normal. Neck supple.  Cardiovascular: Normal rate and regular rhythm.  Pulmonary/Chest: Effort normal and breath sounds normal. She exhibits no tenderness.  Abdominal: Soft. She exhibits no distension. There is no tenderness. There is no guarding.  Musculoskeletal: Normal range of motion.  Neurological: She is alert and oriented to person, place, and time. No cranial nerve deficit. She exhibits normal muscle tone. Coordination normal.  No dysarthria, or aphasia.  Intact facial musculature.  Skin: Skin is warm and dry.  Psychiatric: She has a normal mood and affect. Her behavior is normal. Judgment and thought content normal.  Nursing note and vitals reviewed.    ED Treatments / Results  Labs (all labs ordered are listed, but only abnormal results are displayed) Labs Reviewed - No data to display  EKG  EKG Interpretation None       Radiology Mr Brain Wo  Contrast  Result Date: 03/13/2017 CLINICAL DATA:  Intermittent swelling of the left side of the face. EXAM: MRI HEAD WITHOUT CONTRAST TECHNIQUE: Multiplanar, multiecho pulse sequences of the brain and surrounding structures were obtained without intravenous contrast. COMPARISON:  CT 06/17/2014.  MRI 05/01/2004. FINDINGS: Brain: No significant change since the study of 12 years ago. The brain does not show advanced atrophy. The brainstem and cerebellum are normal. Cerebral hemispheres show a few scattered foci of T2 and FLAIR signal, nonprogressive. No cortical or large vessel territory insult. No mass lesion, hemorrhage, hydrocephalus or extra-axial collection. Vascular: Major vessels at the base of the brain show flow. Skull and upper cervical spine: Negative Sinuses/Orbits: Clear/normal Other: No soft  tissue lesion of the scalp or upper face is identified. IMPRESSION: No acute finding. No cause of the presenting symptoms is identified. Few scattered foci of T2 and FLAIR signal in the hemispheric white matter, similar to the study of 2006. Electronically Signed   By: Nelson Chimes M.D.   On: 03/13/2017 13:46    Procedures Procedures (including critical care time)  Medications Ordered in ED Medications  diphenhydrAMINE (BENADRYL) capsule 25 mg (25 mg Oral Given 03/13/17 0848)  famotidine (PEPCID) tablet 20 mg (20 mg Oral Given 03/13/17 0848)     Initial Impression / Assessment and Plan / ED Course  I have reviewed the triage vital signs and the nursing notes.  Pertinent labs & imaging results that were available during my care of the patient were reviewed by me and considered in my medical decision making (see chart for details).     Patient Vitals for the past 24 hrs:  BP Temp Temp src Pulse Resp SpO2  03/13/17 1245 (!) 149/79 - - 60 18 99 %  03/13/17 1200 129/85 - - (!) 55 18 95 %  03/13/17 1115 136/70 - - (!) 55 18 95 %  03/13/17 1030 125/72 - - (!) 51 20 95 %  03/13/17 0945 124/75 - -  (!) 54 18 95 %  03/13/17 0846 - 98.3 F (36.8 C) - - - -  03/13/17 0815 (!) 156/81 - - (!) 55 18 97 %  03/13/17 0725 (!) 142/90 - - (!) 59 18 96 %  03/13/17 0700 (!) 182/89 98.2 F (36.8 C) Oral 68 17 99 %    2:10 PM Reevaluation with update and discussion. After initial assessment and treatment, an updated evaluation reveals patient feels that she has less swelling of her time as does her daughter.  The patient is relieved that there is no sign of stroke.  Findings discussed and questions answered. Daleen Bo      Final Clinical Impressions(s) / ED Diagnoses   Final diagnoses:  Facial swelling    Lip swelling, nonspecific, possibly related to allergy to Abreva.  Doubt facial nerve paralysis/bells palsy, CVA, sinusitis or intraoral infection.  Nursing Notes Reviewed/ Care Coordinated Applicable Imaging Reviewed Interpretation of Laboratory Data incorporated into ED treatment  The patient appears reasonably screened and/or stabilized for discharge and I doubt any other medical condition or other [facility] requiring further screening, evaluation, or treatment in the ED at this time prior to discharge.  Plan: Home Medications-stop Abreva Thana Farr continue usual medications, take Benadryl and Pepcid for for 5 days.; Home Treatments-rest; return here if the recommended treatment, does not improve the symptoms; Recommended follow up-PCP as needed   ED Discharge Orders    None       Daleen Bo, MD 03/13/17 1414

## 2017-03-16 DIAGNOSIS — T783XXA Angioneurotic edema, initial encounter: Secondary | ICD-10-CM | POA: Diagnosis not present

## 2017-03-16 DIAGNOSIS — B001 Herpesviral vesicular dermatitis: Secondary | ICD-10-CM | POA: Diagnosis not present

## 2017-04-20 DIAGNOSIS — H6123 Impacted cerumen, bilateral: Secondary | ICD-10-CM | POA: Diagnosis not present

## 2017-05-11 DIAGNOSIS — H40021 Open angle with borderline findings, high risk, right eye: Secondary | ICD-10-CM | POA: Diagnosis not present

## 2017-05-11 DIAGNOSIS — H4052X4 Glaucoma secondary to other eye disorders, left eye, indeterminate stage: Secondary | ICD-10-CM | POA: Diagnosis not present

## 2017-07-19 DIAGNOSIS — Z9841 Cataract extraction status, right eye: Secondary | ICD-10-CM | POA: Diagnosis not present

## 2017-07-19 DIAGNOSIS — Z961 Presence of intraocular lens: Secondary | ICD-10-CM | POA: Diagnosis not present

## 2017-07-19 DIAGNOSIS — H35372 Puckering of macula, left eye: Secondary | ICD-10-CM | POA: Diagnosis not present

## 2017-07-19 DIAGNOSIS — Z9842 Cataract extraction status, left eye: Secondary | ICD-10-CM | POA: Diagnosis not present

## 2017-07-26 DIAGNOSIS — Z01419 Encounter for gynecological examination (general) (routine) without abnormal findings: Secondary | ICD-10-CM | POA: Diagnosis not present

## 2017-07-26 DIAGNOSIS — Z124 Encounter for screening for malignant neoplasm of cervix: Secondary | ICD-10-CM | POA: Diagnosis not present

## 2017-10-10 DIAGNOSIS — M810 Age-related osteoporosis without current pathological fracture: Secondary | ICD-10-CM | POA: Diagnosis not present

## 2017-10-10 DIAGNOSIS — E7800 Pure hypercholesterolemia, unspecified: Secondary | ICD-10-CM | POA: Diagnosis not present

## 2017-10-10 DIAGNOSIS — I10 Essential (primary) hypertension: Secondary | ICD-10-CM | POA: Diagnosis not present

## 2017-10-10 DIAGNOSIS — N390 Urinary tract infection, site not specified: Secondary | ICD-10-CM | POA: Diagnosis not present

## 2017-10-10 DIAGNOSIS — Z7982 Long term (current) use of aspirin: Secondary | ICD-10-CM | POA: Diagnosis not present

## 2017-10-12 DIAGNOSIS — H40021 Open angle with borderline findings, high risk, right eye: Secondary | ICD-10-CM | POA: Diagnosis not present

## 2017-10-12 DIAGNOSIS — H4052X2 Glaucoma secondary to other eye disorders, left eye, moderate stage: Secondary | ICD-10-CM | POA: Diagnosis not present

## 2017-10-13 DIAGNOSIS — K219 Gastro-esophageal reflux disease without esophagitis: Secondary | ICD-10-CM | POA: Diagnosis not present

## 2017-10-13 DIAGNOSIS — R9431 Abnormal electrocardiogram [ECG] [EKG]: Secondary | ICD-10-CM | POA: Diagnosis not present

## 2017-10-13 DIAGNOSIS — E7800 Pure hypercholesterolemia, unspecified: Secondary | ICD-10-CM | POA: Diagnosis not present

## 2017-10-13 DIAGNOSIS — K5130 Ulcerative (chronic) rectosigmoiditis without complications: Secondary | ICD-10-CM | POA: Diagnosis not present

## 2017-10-13 DIAGNOSIS — R55 Syncope and collapse: Secondary | ICD-10-CM | POA: Diagnosis not present

## 2017-10-13 DIAGNOSIS — Z0000 Encounter for general adult medical examination without abnormal findings: Secondary | ICD-10-CM | POA: Diagnosis not present

## 2017-10-13 DIAGNOSIS — R600 Localized edema: Secondary | ICD-10-CM | POA: Diagnosis not present

## 2017-10-13 DIAGNOSIS — M810 Age-related osteoporosis without current pathological fracture: Secondary | ICD-10-CM | POA: Diagnosis not present

## 2017-10-13 DIAGNOSIS — B351 Tinea unguium: Secondary | ICD-10-CM | POA: Diagnosis not present

## 2017-10-13 DIAGNOSIS — I10 Essential (primary) hypertension: Secondary | ICD-10-CM | POA: Diagnosis not present

## 2017-10-13 DIAGNOSIS — J309 Allergic rhinitis, unspecified: Secondary | ICD-10-CM | POA: Diagnosis not present

## 2017-10-18 ENCOUNTER — Other Ambulatory Visit: Payer: Self-pay | Admitting: Internal Medicine

## 2017-10-18 ENCOUNTER — Ambulatory Visit (INDEPENDENT_AMBULATORY_CARE_PROVIDER_SITE_OTHER): Payer: Medicare Other

## 2017-10-18 DIAGNOSIS — R55 Syncope and collapse: Secondary | ICD-10-CM

## 2017-10-18 DIAGNOSIS — R002 Palpitations: Secondary | ICD-10-CM

## 2017-10-31 ENCOUNTER — Encounter: Payer: Self-pay | Admitting: Cardiovascular Disease

## 2017-10-31 ENCOUNTER — Ambulatory Visit (INDEPENDENT_AMBULATORY_CARE_PROVIDER_SITE_OTHER): Payer: Medicare Other | Admitting: Cardiovascular Disease

## 2017-10-31 VITALS — BP 132/70 | HR 54 | Ht 64.0 in | Wt 147.8 lb

## 2017-10-31 DIAGNOSIS — R55 Syncope and collapse: Secondary | ICD-10-CM

## 2017-10-31 DIAGNOSIS — R42 Dizziness and giddiness: Secondary | ICD-10-CM | POA: Diagnosis not present

## 2017-10-31 DIAGNOSIS — E7800 Pure hypercholesterolemia, unspecified: Secondary | ICD-10-CM

## 2017-10-31 MED ORDER — AMLODIPINE BESYLATE 5 MG PO TABS: 5.0000 mg | ORAL_TABLET | Freq: Every day | ORAL | 3 refills | Status: DC

## 2017-10-31 NOTE — Assessment & Plan Note (Signed)
History of hyperlipidemia on atorvastatin with lipid profile performed 10/10/2017 revealing total cholesterol 151, LDL 68 and HDL of 59.

## 2017-10-31 NOTE — Progress Notes (Signed)
10/31/2017 JAYCEY GENS   1940-08-14  [id number]  Primary Physician Deland Pretty, MD Primary Cardiologist: Lorretta Harp MD [name], [place]  HPI:  [name] is a 77 y.o.  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 She was referred by Dr. Dr. Deland Pretty at [facility]  medical initially for evaluation of generalized fatigue and an abnormal EKG. I last saw her in the office 07/31/2013.  Her cardiovascular risk factor profile is remarkable for hypertension, hyperlipidemia and family history. Her father had heart-related issues in the 39s and a brother did have a stent before he died of lung cancer in his 24s. She has never had a heart attack or stroke. She does complain of dyspnea but denies chest pain. She was referred for an abnormal EKG with ST segment changes. She had a subsequent 2D echocardiogram after her office visit performed 08/14/2013 that was essentially normal except for mild AI.  Since I saw her 4 years ago she is been referred back for 6 episodes of presyncope which began several months ago.  She does feel palpitations during these and get some shortness of breath as well as weakness after they subside.  Holter monitor showed sinus rhythm consult sinus sinus bradycardia with occasional PVCs and occasional short atrial runs which do not appear to be contributory.  She denies chest pain or shortness of breath.  Current Meds  Medication Sig  . acetaminophen (TYLENOL) 500 MG tablet Take 1,000 mg by mouth every 6 (six) hours as needed for headache.  Marland Kitchen acyclovir (ZOVIRAX) 400 MG tablet Take 400 mg by mouth 3 (three) times daily.  Marland Kitchen aspirin EC 81 MG tablet Take 81 mg by mouth daily.  Marland Kitchen atenolol (TENORMIN) 25 MG tablet Take 12.5 mg by mouth daily.   Marland Kitchen atorvastatin (LIPITOR) 20 MG tablet Take 20 mg by mouth at bedtime.   .  Calcium Carbonate-Vit D-Min (CALCIUM 1200) 1200-1000 MG-UNIT CHEW Chew 1 tablet by mouth daily.   . cholecalciferol (VITAMIN D) 1000 UNITS tablet Take 1,000 Units by mouth daily.  . dorzolamide-timolol (COSOPT) 22.3-6.8 MG/ML ophthalmic solution Place 1 drop into the left eye 2 (two) times daily.  . Multiple Vitamins-Minerals (ICAPS MV PO) Take 1 tablet by mouth 2 (two) times daily.  Vladimir Faster Glycol-Propyl Glycol 0.4-0.3 % SOLN Place 1 drop into the right eye as needed (For dry eyes.).   Marland Kitchen rizatriptan (MAXALT-MLT) 10 MG disintegrating tablet Take 1 tablet by mouth daily as needed for migraine.   . triamterene-hydrochlorothiazide (MAXZIDE-25) 37.5-25 MG per tablet Take 0.5 tablets by mouth as needed (For swelling.).      Allergies  Allergen Reactions  . Abreva [Docosanol] Swelling and Other (See Comments)    Top lip and side of face swollen  . Codeine Nausea Only    Social History   Socioeconomic History  . Marital status: Widowed    Spouse name: Not on file  . Number of children: Not on file  . Years of education: Not on file  . Highest education level: Not on file  Occupational History  . Not on file  Social Needs  . Financial resource strain: Not on file  . Food insecurity:    Worry: Not on file    Inability: Not on file  . Transportation needs:    Medical: Not on file  Non-medical: Not on file  Tobacco Use  . Smoking status: Never Smoker  . Smokeless tobacco: Never Used  Substance and Sexual Activity  . Alcohol use: No  . Drug use: No  . Sexual activity: Not on file  Lifestyle  . Physical activity:    Days per week: Not on file    Minutes per session: Not on file  . Stress: Not on file  Relationships  . Social connections:    Talks on phone: Not on file    Gets together: Not on file    Attends religious service: Not on file    Active member of club or organization: Not on file    Attends meetings of clubs or organizations: Not on file    Relationship  status: Not on file  . Intimate partner violence:    Fear of current or ex partner: Not on file    Emotionally abused: Not on file    Physically abused: Not on file    Forced sexual activity: Not on file  Other Topics Concern  . Not on file  Social History Narrative  . Not on file     Review of Systems: General: negative for chills, fever, night sweats or weight changes.  Cardiovascular: negative for chest pain, dyspnea on exertion, edema, orthopnea, palpitations, paroxysmal nocturnal dyspnea or shortness of breath Dermatological: negative for rash Respiratory: negative for cough or wheezing Urologic: negative for hematuria Abdominal: negative for nausea, vomiting, diarrhea, bright red blood per rectum, melena, or hematemesis Neurologic: negative for visual changes, syncope, or dizziness All other systems reviewed and are otherwise negative except as noted above.    Blood pressure 132/70, pulse (!) 54, height 5\' 4"  (1.626 m), weight 147 lb 12.8 oz (67 kg).  General appearance: alert and no distress Neck: no adenopathy, no carotid bruit, no JVD, supple, symmetrical, trachea midline and thyroid not enlarged, symmetric, no tenderness/mass/nodules Lungs: clear to auscultation bilaterally Heart: regular rate and rhythm, S1, S2 normal, no murmur, click, rub or gallop Extremities: extremities normal, atraumatic, no cyanosis or edema Pulses: 2+ and symmetric Skin: Skin color, texture, turgor normal. No rashes or lesions Neurologic: Alert and oriented X 3, normal strength and tone. Normal symmetric reflexes. Normal coordination and gait  EKG sinus bradycardia 54 with nonspecific ST and T wave changes.  I personally reviewed this EKG.  ASSESSMENT AND PLAN:   Essential hypertension History of essential hypertension her blood pressure measured at 132/70.  She is on atenolol low-dose and Maxide.  She is somewhat bradycardic today with a heart rate of 54.  I am going to wean her off her  atenolol and begin her on amlodipine.  She will keep a blood pressure log for the next 30 days and will come back in review this with Cyril Mourning to make to make further adjustments.  Hyperlipidemia History of hyperlipidemia on atorvastatin with lipid profile performed 10/10/2017 revealing total cholesterol 151, LDL 68 and HDL of 59.  Dizziness Patient has a several month history of dizziness/presyncope.  She is had approximately 6 episodes which began randomly, cause dizziness and blurred vision and leave her feeling weak afterwards.  A 2D echocardiogram performed 08/14/2013 was essentially normal with mild AI.  Recent Holter monitor performed over 48 hours showed sinus rhythm, sinus bradycardia with occasional PVCs and short atrial runs which certainly would not contribute to her symptoms although she did not have a episode while wearing the Holter monitor.  Am going to get a 30-day event monitor as  well as a repeat 2D echocardiogram to further evaluate.  In addition, I am going to wean her off her beta-blocker and begin low-dose amlodipine for hypertension.      Lorretta Harp MD FACP,FACC,FAHA, [facility] 10/31/2017 1:42 PM

## 2017-10-31 NOTE — Assessment & Plan Note (Signed)
Patient has a several month history of dizziness/presyncope.  She is had approximately 6 episodes which began randomly, cause dizziness and blurred vision and leave her feeling weak afterwards.  A 2D echocardiogram performed 08/14/2013 was essentially normal with mild AI.  Recent Holter monitor performed over 48 hours showed sinus rhythm, sinus bradycardia with occasional PVCs and short atrial runs which certainly would not contribute to her symptoms although she did not have a episode while wearing the Holter monitor.  Am going to get a 30-day event monitor as well as a repeat 2D echocardiogram to further evaluate.  In addition, I am going to wean her off her beta-blocker and begin low-dose amlodipine for hypertension.

## 2017-10-31 NOTE — Patient Instructions (Signed)
Medication Instructions:   TAKE ATENOLOL EVERY OTHER DAY X 1 WEEK THEN TAKE ATENOLOL EVERY THIRD DAY FOR ONE WEEK THEN STOP  AFTER STOPPING ATENOLOL=START AMLODIPINE 5 MG ONCE DAILY  Testing/Procedures:  Your physician has requested that you have an echocardiogram. Echocardiography is a painless test that uses sound waves to create images of your heart. It provides your doctor with information about the size and shape of your heart and how well your heart's chambers and valves are working. This procedure takes approximately one hour. There are no restrictions for this procedure.   Your physician has recommended that you wear a 30 DAY event monitor. Event monitors are medical devices that record the heart's electrical activity. Doctors most often [place] these monitors to diagnose arrhythmias. Arrhythmias are problems with the speed or rhythm of the heartbeat. The monitor is a small, portable device. You can wear one while you do your normal daily activities. This is usually used to diagnose what is causing palpitations/syncope (passing out).    Follow-Up:  Your physician recommends that you schedule a follow-up appointment in: [place]  Your physician recommends that you schedule a follow-up appointment in: [place]  If you need a refill on your cardiac medications before your next appointment, please call your pharmacy.

## 2017-10-31 NOTE — Assessment & Plan Note (Signed)
History of essential hypertension her blood pressure measured at 132/70.  She is on atenolol low-dose and Maxide.  She is somewhat bradycardic today with a heart rate of 54.  I am going to wean her off her atenolol and begin her on amlodipine.  She will keep a blood pressure log for the next 30 days and will come back in review this with [name] to make to make further adjustments.

## 2017-11-02 DIAGNOSIS — H40021 Open angle with borderline findings, high risk, right eye: Secondary | ICD-10-CM | POA: Diagnosis not present

## 2017-11-02 DIAGNOSIS — H4052X2 Glaucoma secondary to other eye disorders, left eye, moderate stage: Secondary | ICD-10-CM | POA: Diagnosis not present

## 2017-11-07 ENCOUNTER — Ambulatory Visit (INDEPENDENT_AMBULATORY_CARE_PROVIDER_SITE_OTHER): Payer: Medicare Other

## 2017-11-07 ENCOUNTER — Other Ambulatory Visit: Payer: Self-pay

## 2017-11-07 ENCOUNTER — Ambulatory Visit (HOSPITAL_COMMUNITY): Payer: Medicare Other | Attending: Cardiovascular Disease

## 2017-11-07 DIAGNOSIS — E785 Hyperlipidemia, unspecified: Secondary | ICD-10-CM | POA: Diagnosis not present

## 2017-11-07 DIAGNOSIS — R42 Dizziness and giddiness: Secondary | ICD-10-CM | POA: Diagnosis not present

## 2017-11-07 DIAGNOSIS — I351 Nonrheumatic aortic (valve) insufficiency: Secondary | ICD-10-CM | POA: Insufficient documentation

## 2017-11-07 DIAGNOSIS — I119 Hypertensive heart disease without heart failure: Secondary | ICD-10-CM | POA: Diagnosis not present

## 2017-11-07 DIAGNOSIS — R55 Syncope and collapse: Secondary | ICD-10-CM

## 2017-11-28 ENCOUNTER — Ambulatory Visit (INDEPENDENT_AMBULATORY_CARE_PROVIDER_SITE_OTHER): Payer: Medicare Other | Admitting: Pharmacist

## 2017-11-28 VITALS — BP 128/70 | HR 62

## 2017-11-28 DIAGNOSIS — I10 Essential (primary) hypertension: Secondary | ICD-10-CM

## 2017-11-28 NOTE — Progress Notes (Signed)
Patient ID: [name]                 DOB: Oct 10, 1940                      MRN: [id number]     HPI: [name] is a 77 y.o. female referred by [name] to HTN clinic. PMH is significant for HTN, HLD, and family history of premature CAD. Pt was experiencing presyncopal episodes. During these episodes, she felt palpitations, some SOB, and weakness. 48 hour holter monitor showed sinus rhythm with sinus bradycardia, occasional PVCs, and short atrial runs. These were not felt to be contributing to patient's symptoms. 30 day event monitor and repeat echo were ordered. At last visit 4 weeks ago, BP was 132/70 and HR 54. Her atenolol was tapered off and pt was instructed to start amlodipine 5mg  daily after she discontinued atenolol. Pt presents today for further management.  Pt presents today in good spirits. She stopped taking her atenolol and started amlodipine on September 1st. Beta blocker therapy was prescribed by PCP for migraine prevention. She used to take propranolol but was changed to atenolol due to bradycardia with HR in the 40s. HR has improved since discontinuing atenolol to the low-mid 60s. Pt reports energy level has improved as well.  She has not used her triamterene-HCTZ at all in the past 2-3 weeks. Typically takes this prn for swelling in her hands. Pt did notice headache and slight blurred vision a few weeks ago - this correlates with when she stopped atenolol and was beginning amlodipine and her BP readings were higher with systolic readings [id number]. Symptoms have improved with better BP control the longer she has been on amlodipine. She notices very slight swelling in her feet but this is not bothersome. She still has some episodes of weakness - Holter monitor follow up appt with Dr Gwenlyn Jimenez on Oct 1 to discuss further.  Pt also has questions regarding Prolia injections that her MD is wanting to start her on. She previously took Fosamax however developed chest pain on therapy.  Discussed risk of medication and provided pt with further educational handouts.  Current HTN meds: amlodipine 5mg  daily, triamterene-HCTZ 1/2 tablet prn for swelling Previously tried: propranolol, atenolol 12.5mg  - discontinued due to bradycardia BP goal: <130/59mmHg  Family History: Father with heart-related issues in his 16s. Brother with stent in his 81-50s.  Social History: Denies tobacco, alcohol, and illicit drug use.  Diet: Weight Watchers a few years ago and lost 30 lbs and kept weight off. Avoids sweets and fried foods. Drinks water and 1 cup of coffee each day. Eats most meals at home, does not add salt to food.   Exercise: Likes to walk, has not been walking as much with  Home BP readings: 110s-120s/70-80 in the past week or two, HR 60s.   Wt Readings from Last 3 Encounters:  10/31/17 147 lb 12.8 oz (67 kg)  05/05/15 159 lb 2 oz (72.2 kg)  06/16/14 165 lb (74.8 kg)   BP Readings from Last 3 Encounters:  10/31/17 132/70  03/13/17 (!) 142/76  05/11/15 115/63   Pulse Readings from Last 3 Encounters:  10/31/17 (!) 54  03/13/17 (!) 56  05/11/15 65    Renal function: CrCl cannot be calculated (Patient's most recent lab result is older than the maximum 21 days allowed.).  Past Medical History:  Diagnosis Date  . Abnormal EKG    LVH with repolarization changes  .  Arthritis   . Headache    migraines   . Hyperlipidemia   . Hyperlipidemia   . Visual disorder    Torn retina    Current Outpatient Medications on File Prior to Visit  Medication Sig Dispense Refill  . acetaminophen (TYLENOL) 500 MG tablet Take 1,000 mg by mouth every 6 (six) hours as needed for headache.    Marland Kitchen acyclovir (ZOVIRAX) 400 MG tablet Take 400 mg by mouth 3 (three) times daily.    Marland Kitchen amLODipine (NORVASC) 5 MG tablet Take 1 tablet (5 mg total) by mouth daily. 90 tablet 3  . aspirin EC 81 MG tablet Take 81 mg by mouth daily.    Marland Kitchen atenolol (TENORMIN) 25 MG tablet Take 12.5 mg by mouth daily.       Marland Kitchen atorvastatin (LIPITOR) 20 MG tablet Take 20 mg by mouth at bedtime.     . Calcium Carbonate-Vit D-Min (CALCIUM 1200) 1200-1000 MG-UNIT CHEW Chew 1 tablet by mouth daily.     . cholecalciferol (VITAMIN D) 1000 UNITS tablet Take 1,000 Units by mouth daily.    . dorzolamide-timolol (COSOPT) 22.3-6.8 MG/ML ophthalmic solution Place 1 drop into the left eye 2 (two) times daily.    . Multiple Vitamins-Minerals (ICAPS MV PO) Take 1 tablet by mouth 2 (two) times daily.    Vladimir Faster Glycol-Propyl Glycol 0.4-0.3 % SOLN Place 1 drop into the right eye as needed (For dry eyes.).     Marland Kitchen rizatriptan (MAXALT-MLT) 10 MG disintegrating tablet Take 1 tablet by mouth daily as needed for migraine.     . triamterene-hydrochlorothiazide (MAXZIDE-25) 37.5-25 MG per tablet Take 0.5 tablets by mouth as needed (For swelling.).      No current facility-administered medications on file prior to visit.     Allergies  Allergen Reactions  . Abreva [Docosanol] Swelling and Other (See Comments)    Top lip and side of face swollen  . Codeine Nausea Only     Assessment/Plan:  1. Hypertension - BP at goal <130/87mmHg and bradycardia has resolved since discontinuing atenolol. Pt has been feeling better overall. Will continue amlodipine 5mg  daily, pt has been using triamterene-HCTZ sparingly for edema in hands. Trace edema noted in ankles since pt has started amlodipine however this is not bothersome to pt. Advised pt to contact PCP if migraine frequency increases with discontinuation of beta blocker therapy. She will monitor and call with any BP related concerns. Will keep f/u with Dr Gwenlyn Jimenez in 2 weeks to discuss Holter monitor results. F/u in HTN clinic as needed.   [name] E. [name], PharmD, BCACP, [place] [place] [address], [place], [place] [address] Phone: [phone]; Fax: [phone] 11/28/2017 11:31 AM

## 2017-11-28 NOTE — Patient Instructions (Addendum)
It was nice to see you today  Your blood pressure goal is < 130/91mmHg  Continue taking amlodipine 5mg  daily for your blood pressure  Keep follow up with Dr Gwenlyn Found in 2 weeks  Follow up as needed in blood pressure clinic

## 2017-12-04 ENCOUNTER — Telehealth: Payer: Self-pay | Admitting: *Deleted

## 2017-12-04 ENCOUNTER — Encounter: Payer: Self-pay | Admitting: *Deleted

## 2017-12-04 NOTE — Telephone Encounter (Addendum)
Received critical notification from [employer]:  12/03/2017 at 9:11 pm Sinus Rhythm with run of vtach (5 beats)/PACS HR 179, auto trigger.  Monitor placed for dizziness/presyncope Hx of PVC, atrial runs  Left message for patient to call back  DOD reviewed, no recommendations at this time, continue to monitor.   Spoke to patient, states she is still having episodes of dizziness and fatigue but denies recent symptoms.   Advised to continue with monitor and trigger if episode occurs.  Pt verbalized understanding.

## 2017-12-04 NOTE — Telephone Encounter (Signed)
This encounter was created in error - please disregard.

## 2017-12-08 DIAGNOSIS — Z23 Encounter for immunization: Secondary | ICD-10-CM | POA: Diagnosis not present

## 2017-12-12 ENCOUNTER — Ambulatory Visit (INDEPENDENT_AMBULATORY_CARE_PROVIDER_SITE_OTHER): Payer: Medicare Other | Admitting: Cardiovascular Disease

## 2017-12-12 ENCOUNTER — Encounter: Payer: Self-pay | Admitting: Cardiovascular Disease

## 2017-12-12 DIAGNOSIS — R42 Dizziness and giddiness: Secondary | ICD-10-CM

## 2017-12-12 NOTE — Progress Notes (Signed)
[name] returns today for follow-up of her outpatient test performed in evaluation of presyncope.  Her 2D echo was essentially normal and her event monitor showed sinus rhythm, sinus bradycardia and a 5 beat run of nonsustained ventricular tachycardia.  She has not had her presyncopal episodes in several months.  I have reassured her about her symptoms and at this point do not feel compelled to further evaluate.  I will will have her see a PA back in 6 months and me back in 1 year.  Lorretta Harp, M.D., [place], [facility], [place] [address] [place], [place]  [id number]  [phone] 12/12/2017 11:35 AM

## 2017-12-12 NOTE — Patient Instructions (Signed)

## 2017-12-12 NOTE — Assessment & Plan Note (Signed)
[name] returns today for follow-up of her outpatient test performed in evaluation of presyncope.  Her 2D echo was essentially normal and her event monitor showed sinus rhythm, sinus bradycardia and a 5 beat run of nonsustained ventricular tachycardia.  She has not had her presyncopal episodes in several months.  I have reassured her about her symptoms and at this point do not feel compelled to further evaluate.  I will will have her see a PA back in 6 months and me back in 1 year.

## 2017-12-26 ENCOUNTER — Other Ambulatory Visit: Payer: Self-pay | Admitting: Internal Medicine

## 2017-12-26 DIAGNOSIS — Z1231 Encounter for screening mammogram for malignant neoplasm of breast: Secondary | ICD-10-CM

## 2018-01-17 DIAGNOSIS — H6123 Impacted cerumen, bilateral: Secondary | ICD-10-CM | POA: Diagnosis not present

## 2018-02-12 ENCOUNTER — Ambulatory Visit
Admission: RE | Admit: 2018-02-12 | Discharge: 2018-02-12 | Disposition: A | Discharge status: Home / Self Care | Payer: Medicare Other | Source: Ambulatory Visit | Attending: Internal Medicine | Admitting: Internal Medicine

## 2018-02-12 DIAGNOSIS — Z1231 Encounter for screening mammogram for malignant neoplasm of breast: Secondary | ICD-10-CM | POA: Diagnosis not present

## 2018-02-19 DIAGNOSIS — H35352 Cystoid macular degeneration, left eye: Secondary | ICD-10-CM | POA: Diagnosis not present

## 2018-02-19 DIAGNOSIS — H40021 Open angle with borderline findings, high risk, right eye: Secondary | ICD-10-CM | POA: Diagnosis not present

## 2018-02-19 DIAGNOSIS — H35342 Macular cyst, hole, or pseudohole, left eye: Secondary | ICD-10-CM | POA: Diagnosis not present

## 2018-02-19 DIAGNOSIS — H4052X2 Glaucoma secondary to other eye disorders, left eye, moderate stage: Secondary | ICD-10-CM | POA: Diagnosis not present

## 2018-03-15 DIAGNOSIS — H35372 Puckering of macula, left eye: Secondary | ICD-10-CM | POA: Diagnosis not present

## 2018-03-15 DIAGNOSIS — Z961 Presence of intraocular lens: Secondary | ICD-10-CM | POA: Diagnosis not present

## 2018-03-15 DIAGNOSIS — H40021 Open angle with borderline findings, high risk, right eye: Secondary | ICD-10-CM | POA: Diagnosis not present

## 2018-03-15 DIAGNOSIS — H4052X2 Glaucoma secondary to other eye disorders, left eye, moderate stage: Secondary | ICD-10-CM | POA: Diagnosis not present

## 2018-03-15 DIAGNOSIS — H33312 Horseshoe tear of retina without detachment, left eye: Secondary | ICD-10-CM | POA: Diagnosis not present

## 2018-03-15 DIAGNOSIS — H35342 Macular cyst, hole, or pseudohole, left eye: Secondary | ICD-10-CM | POA: Diagnosis not present

## 2018-04-11 DIAGNOSIS — H35372 Puckering of macula, left eye: Secondary | ICD-10-CM | POA: Diagnosis not present

## 2018-04-11 DIAGNOSIS — H26492 Other secondary cataract, left eye: Secondary | ICD-10-CM | POA: Diagnosis not present

## 2018-04-11 DIAGNOSIS — Z9842 Cataract extraction status, left eye: Secondary | ICD-10-CM | POA: Diagnosis not present

## 2018-04-11 DIAGNOSIS — Z961 Presence of intraocular lens: Secondary | ICD-10-CM | POA: Diagnosis not present

## 2018-04-11 DIAGNOSIS — Z9841 Cataract extraction status, right eye: Secondary | ICD-10-CM | POA: Diagnosis not present

## 2018-04-16 DIAGNOSIS — Z9841 Cataract extraction status, right eye: Secondary | ICD-10-CM | POA: Diagnosis not present

## 2018-04-16 DIAGNOSIS — Z9842 Cataract extraction status, left eye: Secondary | ICD-10-CM | POA: Diagnosis not present

## 2018-04-16 DIAGNOSIS — H35372 Puckering of macula, left eye: Secondary | ICD-10-CM | POA: Diagnosis not present

## 2018-04-16 DIAGNOSIS — Z961 Presence of intraocular lens: Secondary | ICD-10-CM | POA: Diagnosis not present

## 2018-04-16 DIAGNOSIS — H2640 Unspecified secondary cataract: Secondary | ICD-10-CM | POA: Diagnosis not present

## 2018-04-16 DIAGNOSIS — H26492 Other secondary cataract, left eye: Secondary | ICD-10-CM | POA: Diagnosis not present

## 2018-04-17 DIAGNOSIS — Z961 Presence of intraocular lens: Secondary | ICD-10-CM | POA: Diagnosis not present

## 2018-04-17 DIAGNOSIS — H35372 Puckering of macula, left eye: Secondary | ICD-10-CM | POA: Diagnosis not present

## 2018-04-17 DIAGNOSIS — H209 Unspecified iridocyclitis: Secondary | ICD-10-CM | POA: Diagnosis not present

## 2018-05-01 DIAGNOSIS — Z961 Presence of intraocular lens: Secondary | ICD-10-CM | POA: Diagnosis not present

## 2018-05-01 DIAGNOSIS — H209 Unspecified iridocyclitis: Secondary | ICD-10-CM | POA: Diagnosis not present

## 2018-05-01 DIAGNOSIS — H35372 Puckering of macula, left eye: Secondary | ICD-10-CM | POA: Diagnosis not present

## 2018-05-04 DIAGNOSIS — H209 Unspecified iridocyclitis: Secondary | ICD-10-CM | POA: Diagnosis not present

## 2018-05-29 DIAGNOSIS — H35372 Puckering of macula, left eye: Secondary | ICD-10-CM | POA: Diagnosis not present

## 2018-05-29 DIAGNOSIS — Z961 Presence of intraocular lens: Secondary | ICD-10-CM | POA: Diagnosis not present

## 2018-05-29 DIAGNOSIS — H209 Unspecified iridocyclitis: Secondary | ICD-10-CM | POA: Diagnosis not present

## 2018-06-08 ENCOUNTER — Telehealth: Payer: Self-pay | Admitting: Cardiology

## 2018-06-08 NOTE — Telephone Encounter (Signed)
New Message:    Pt wants to know if she should keep her appt on Wednesday(06-13-18) with Lurena Joiner?

## 2018-06-11 NOTE — Telephone Encounter (Signed)
Patient made aware that she will be completing a virtual visit on Wednesday 06/13/2018. Patient told she does not need to download any Apps and I would walk her through the steps when I call her. She voiced understanding.

## 2018-06-12 ENCOUNTER — Telehealth: Payer: Self-pay | Admitting: Cardiovascular Disease

## 2018-06-12 ENCOUNTER — Telehealth: Payer: Self-pay | Admitting: Cardiology

## 2018-06-12 ENCOUNTER — Encounter

## 2018-06-12 ENCOUNTER — Other Ambulatory Visit: Payer: Self-pay

## 2018-06-12 NOTE — Telephone Encounter (Signed)
Left message to call back to do visit pre-call.

## 2018-06-12 NOTE — Telephone Encounter (Signed)
New Message   Patient returning [place] phone call.

## 2018-06-13 ENCOUNTER — Encounter: Payer: Self-pay | Admitting: Cardiology

## 2018-06-13 ENCOUNTER — Telehealth (INDEPENDENT_AMBULATORY_CARE_PROVIDER_SITE_OTHER): Payer: Medicare Other | Admitting: Cardiology

## 2018-06-13 VITALS — BP 137/76 | HR 62 | Ht 64.0 in | Wt 146.0 lb

## 2018-06-13 DIAGNOSIS — Z8679 Personal history of other diseases of the circulatory system: Secondary | ICD-10-CM

## 2018-06-13 DIAGNOSIS — R42 Dizziness and giddiness: Secondary | ICD-10-CM

## 2018-06-13 DIAGNOSIS — E785 Hyperlipidemia, unspecified: Secondary | ICD-10-CM | POA: Diagnosis not present

## 2018-06-13 DIAGNOSIS — I10 Essential (primary) hypertension: Secondary | ICD-10-CM | POA: Diagnosis not present

## 2018-06-13 DIAGNOSIS — Z8249 Family history of ischemic heart disease and other diseases of the circulatory system: Secondary | ICD-10-CM | POA: Diagnosis not present

## 2018-06-13 DIAGNOSIS — R9431 Abnormal electrocardiogram [ECG] [EKG]: Secondary | ICD-10-CM

## 2018-06-13 DIAGNOSIS — E782 Mixed hyperlipidemia: Secondary | ICD-10-CM

## 2018-06-13 NOTE — Telephone Encounter (Signed)
Pt had Telehealth visit today with Kerin Ransom, PA.

## 2018-06-13 NOTE — Patient Instructions (Signed)
Medication Instructions:  STOP Aspirin If you need a refill on your cardiac medications before your next appointment, please call your pharmacy.   Lab work: None  If you have labs (blood work) drawn today and your tests are completely normal, you will receive your results only by: Marland Kitchen MyChart Message (if you have MyChart) OR . A paper copy in the mail If you have any lab test that is abnormal or we need to change your treatment, we will call you to review the results.  Testing/Procedures: None   Follow-Up: At [facility], you and your health needs are our priority.  As part of our continuing mission to provide you with exceptional heart care, we have created designated Provider Care Teams.  These Care Teams include your primary Cardiologist (physician) and Advanced Practice Providers (APPs -  Physician Assistants and Nurse Practitioners) who all work together to provide you with the care you need, when you need it. You will need a follow up appointment in 6 months.  Please call our office 2 months(August) in advance to schedule this appointment.  You may see Dr Quay Burow or one of the following Advanced Practice Providers on your designated Care Team:   Kerin Ransom, PA-C Roby Lofts, [place] . Sande Rives, PA-C  Any Other Special Instructions Will Be Listed Below (If Applicable).

## 2018-06-13 NOTE — Progress Notes (Signed)
Virtual Visit via Video Note    Evaluation Performed:  Follow-up visit  This visit type was conducted due to national recommendations for restrictions regarding the COVID-19 Pandemic (e.g. social distancing).  This format is felt to be most appropriate for this patient at this time.  All issues noted in this document were discussed and addressed.  No physical exam was performed (except for noted visual exam findings with Video Visits).  Please refer to the patient's chart (MyChart message for video visits and phone note for telephone visits) for the patient's consent to telehealth for [facility].  Date:  06/13/2018   ID:  [name], DOB 1940/07/03, MRN [id number]  Patient Location:  home  Provider location:   home  PCP:  Deland Pretty, MD  Cardiologist:  Dr Gwenlyn Found Electrophysiologist:  None   Chief Complaint:  6 month visit  History of Present Illness:    [name] is a 78 y.o. female who presents via audio/video conferencing for a telehealth visit today.  She is a pleasant 78 year old female who has been followed by Dr. Gwenlyn Found.  He saw her initially in 2015 for an abnormal EKG, she had sinus bradycardia and nonspecific ST changes.  She has a history of hypertension and dyslipidemia.  Dr. Alvester Chou felt her EKG changes were secondary to LVH.  Echocardiogram showed preserved LV function.  She was seen again in 2018 for near syncope.  Holter monitor showed sinus bradycardia and 1 run of 5 beat nonsustained wide-complex tachycardia.  Echocardiogram revealed an ejection fraction of 60 to 65% with grade 1 diastolic dysfunction, mild AR, and mild left atrial enlargement.  Her medications were adjusted, her bradycardia resolved.  She saw Dr. Gwenlyn Found 6 months ago.  She has been doing well since.  She has not had syncope or near syncope.  She denies any chest pain or palpitations.  She is pretty active for her age.  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  Since she was last seen  she has been diagnosed with an inflammatory condition in her eye and is on methotrexate a couple times a week.  She asked me whether she could take this with a baby aspirin daily.  I reviewed the recommendations for aspirin use and suggested she could stop this.  The patient does not have symptoms concerning for COVID-19 infection (fever, chills, cough, or new shortness of breath).    Prior CV studies:   The following studies were reviewed today:  Echo 11/07/2017 Study Conclusions  - Left ventricle: The cavity size was normal. Systolic function was   normal. The estimated ejection fraction was in the range of 60%   to 65%. Wall motion was normal; there were no regional wall   motion abnormalities. Doppler parameters are consistent with   abnormal left ventricular relaxation (grade 1 diastolic   dysfunction). Doppler parameters are consistent with high   ventricular filling pressure. - Aortic valve: There was mild regurgitation. - Left atrium: The atrium was mildly dilated.  Past Medical History:  Diagnosis Date   Abnormal EKG    LVH with repolarization changes   Arthritis    Headache    migraines    Hyperlipidemia    Hyperlipidemia    Visual disorder    Torn retina   Past Surgical History:  Procedure Laterality Date   APPENDECTOMY     COLON SURGERY     DILATATION & CURETTAGE/HYSTEROSCOPY WITH MYOSURE N/A 05/11/2015   Procedure: DILATATION & CURETTAGE/HYSTEROSCOPY WITH MYOSURE  POLYPECTOMY;  Surgeon: Servando Salina, MD;  Location: [facility];  Service: Gynecology;  Laterality: N/A;   EYE SURGERY     SHOULDER ARTHROSCOPY     TONSILLECTOMY       Current Meds  Medication Sig   acetaminophen (TYLENOL) 500 MG tablet Take 1,000 mg by mouth every 6 (six) hours as needed for headache.   amLODipine (NORVASC) 5 MG tablet Take 1 tablet (5 mg total) by mouth daily.   atorvastatin (LIPITOR) 20 MG tablet Take 20 mg by mouth at bedtime.    CALCIUM CARBONATE-VITAMIN D PO  Take 1,200 Units by mouth daily.   cholecalciferol (VITAMIN D) 1000 UNITS tablet Take 1,000 Units by mouth daily.   dorzolamide-timolol (COSOPT) 22.3-6.8 MG/ML ophthalmic solution Place 1 drop into the left eye 2 (two) times daily.   folic acid (FOLVITE) 1 MG tablet Take 3 mg by mouth daily.   Methotrexate 2.5 MG/ML SOLN Take by mouth. Use as directed   Multiple Vitamins-Minerals (ICAPS MV PO) Take 1 tablet by mouth 2 (two) times daily.   Polyethyl Glycol-Propyl Glycol 0.4-0.3 % SOLN Place 1 drop into the right eye as needed (For dry eyes.).    prednisoLONE acetate (PREDNISOLONE ACETATE P-F) 1 % ophthalmic suspension Place 1 drop into the left eye 3 (three) times daily.   rizatriptan (MAXALT-MLT) 10 MG disintegrating tablet Take 1 tablet by mouth daily as needed for migraine.    triamterene-hydrochlorothiazide (MAXZIDE-25) 37.5-25 MG per tablet Take 0.5 tablets by mouth as needed (For swelling.).    valACYclovir (VALTREX) 1000 MG tablet Take 1,000 mg by mouth daily.   [DISCONTINUED] aspirin EC 81 MG tablet Take 81 mg by mouth daily.   [DISCONTINUED] methotrexate 2.5 MG tablet Take 7.5 mg by mouth once a week.     Allergies:   Abreva [docosanol] and Codeine   Social History   Tobacco Use   Smoking status: Never Smoker   Smokeless tobacco: Never Used  Substance Use Topics   Alcohol use: No   Drug use: No     Family Hx: The patient's family history includes Breast cancer in her maternal aunt, maternal grandmother, and mother; Cancer in her brother and maternal grandmother; Cancer (age of onset: 82) in her mother; Diabetes in her paternal grandmother; Heart disease in her father; Heart disease (age of onset: 97) in her brother; Hyperlipidemia in her child; Hypertension in her father and mother; Kidney disease in her maternal grandfather; Stroke (age of onset: 43) in her father; Stroke (age of onset: 71) in her mother.  ROS:   Please see the history of present illness.      All other systems reviewed and are negative.   Labs/Other Tests and Data Reviewed:    Recent Labs: No results found for requested labs within last 8760 hours.   Recent Lipid Panel No results found for: CHOL, TRIG, HDL, CHOLHDL, LDLCALC, LDLDIRECT  Wt Readings from Last 3 Encounters:  06/13/18 146 lb (66.2 kg)  12/12/17 148 lb 3.2 oz (67.2 kg)  10/31/17 147 lb 12.8 oz (67 kg)     Objective:    Vital Signs:  BP 137/76    Pulse 62    Ht 5\' 4"  (1.626 m)    Wt 146 lb (66.2 kg)    BMI 25.06 kg/m    Well nourished, well developed female in no acute distress.  ASSESSMENT & PLAN:    History of pre syncope- Most likely secondary to bradycardia, resolved with adjustment of her medications  HTN-  Controlled, echo shows normal LVF, grade 1 DD  Dyslipidemia- On statin Rx- followed by PCP  Fm Hx of CAD- Both her father and brother have a history of early CAD  Plan:  OK to stop ASA 81 mg.  F/U Dr Gwenlyn Found in 6 months.   COVID-19 Education: The signs and symptoms of COVID-19 were discussed with the patient and how to seek care for testing (follow up with PCP or arrange E-visit).  The importance of social distancing was discussed today.  Patient Risk:   After full review of this patient's clinical status, I feel that they are at least moderate risk at this time.  Time:   Today, I have spent 20 minutes with the patient with telehealth technology discussing HTN, pre syncope, and ASA therapy.     Medication Adjustments/Labs and Tests Ordered: Current medicines are reviewed at length with the patient today.  Concerns regarding medicines are outlined above.  Tests Ordered: No orders of the defined types were placed in this encounter.  Medication Changes: Stop ASA 81 mg  Disposition:  Follow up in 6 month(s)  Signed, Kerin Ransom, PA-C  06/13/2018 9:03 AM    [place]

## 2018-07-31 DIAGNOSIS — Z7952 Long term (current) use of systemic steroids: Secondary | ICD-10-CM | POA: Diagnosis not present

## 2018-07-31 DIAGNOSIS — H35372 Puckering of macula, left eye: Secondary | ICD-10-CM | POA: Diagnosis not present

## 2018-07-31 DIAGNOSIS — Z961 Presence of intraocular lens: Secondary | ICD-10-CM | POA: Diagnosis not present

## 2018-07-31 DIAGNOSIS — Z9889 Other specified postprocedural states: Secondary | ICD-10-CM | POA: Diagnosis not present

## 2018-07-31 DIAGNOSIS — H209 Unspecified iridocyclitis: Secondary | ICD-10-CM | POA: Diagnosis not present

## 2018-07-31 DIAGNOSIS — Z79899 Other long term (current) drug therapy: Secondary | ICD-10-CM | POA: Diagnosis not present

## 2018-08-07 DIAGNOSIS — W57XXXA Bitten or stung by nonvenomous insect and other nonvenomous arthropods, initial encounter: Secondary | ICD-10-CM | POA: Diagnosis not present

## 2018-08-07 DIAGNOSIS — S60562A Insect bite (nonvenomous) of left hand, initial encounter: Secondary | ICD-10-CM | POA: Diagnosis not present

## 2018-08-07 DIAGNOSIS — L03114 Cellulitis of left upper limb: Secondary | ICD-10-CM | POA: Diagnosis not present

## 2018-08-08 DIAGNOSIS — H6123 Impacted cerumen, bilateral: Secondary | ICD-10-CM | POA: Diagnosis not present

## 2018-08-09 DIAGNOSIS — H40021 Open angle with borderline findings, high risk, right eye: Secondary | ICD-10-CM | POA: Diagnosis not present

## 2018-08-09 DIAGNOSIS — H4052X2 Glaucoma secondary to other eye disorders, left eye, moderate stage: Secondary | ICD-10-CM | POA: Diagnosis not present

## 2018-08-16 ENCOUNTER — Other Ambulatory Visit: Payer: Self-pay

## 2018-08-16 ENCOUNTER — Emergency Department (HOSPITAL_COMMUNITY): Payer: Medicare Other

## 2018-08-16 ENCOUNTER — Encounter (HOSPITAL_COMMUNITY): Payer: Self-pay | Admitting: Emergency Medicine

## 2018-08-16 ENCOUNTER — Emergency Department (HOSPITAL_COMMUNITY)
Admission: EM | Admit: 2018-08-16 | Discharge: 2018-08-17 | Disposition: A | Discharge status: Home / Self Care | Payer: Medicare Other | Attending: Emergency Medicine | Admitting: Emergency Medicine

## 2018-08-16 DIAGNOSIS — Z20828 Contact with and (suspected) exposure to other viral communicable diseases: Secondary | ICD-10-CM | POA: Insufficient documentation

## 2018-08-16 DIAGNOSIS — R002 Palpitations: Secondary | ICD-10-CM | POA: Diagnosis not present

## 2018-08-16 DIAGNOSIS — R0789 Other chest pain: Secondary | ICD-10-CM | POA: Diagnosis not present

## 2018-08-16 DIAGNOSIS — R030 Elevated blood-pressure reading, without diagnosis of hypertension: Secondary | ICD-10-CM | POA: Diagnosis not present

## 2018-08-16 DIAGNOSIS — Z79899 Other long term (current) drug therapy: Secondary | ICD-10-CM | POA: Diagnosis not present

## 2018-08-16 DIAGNOSIS — J9811 Atelectasis: Secondary | ICD-10-CM | POA: Diagnosis not present

## 2018-08-16 DIAGNOSIS — R42 Dizziness and giddiness: Secondary | ICD-10-CM

## 2018-08-16 DIAGNOSIS — I10 Essential (primary) hypertension: Secondary | ICD-10-CM | POA: Diagnosis not present

## 2018-08-16 DIAGNOSIS — G9389 Other specified disorders of brain: Secondary | ICD-10-CM | POA: Diagnosis not present

## 2018-08-16 LAB — CBC
HCT: 47.6 % — ABNORMAL HIGH (ref 36.0–46.0)
Hemoglobin: 15.5 g/dL — ABNORMAL HIGH (ref 12.0–15.0)
MCH: 32.4 pg (ref 26.0–34.0)
MCHC: 32.6 g/dL (ref 30.0–36.0)
MCV: 99.6 fL (ref 80.0–100.0)
Platelets: 201 10*3/uL (ref 150–400)
RBC: 4.78 MIL/uL (ref 3.87–5.11)
RDW: 14 % (ref 11.5–15.5)
WBC: 7.8 10*3/uL (ref 4.0–10.5)
nRBC: 0 % (ref 0.0–0.2)

## 2018-08-16 LAB — COMPREHENSIVE METABOLIC PANEL
ALT: 22 U/L (ref 0–44)
AST: 23 U/L (ref 15–41)
Albumin: 4.7 g/dL (ref 3.5–5.0)
Alkaline Phosphatase: 70 U/L (ref 38–126)
Anion gap: 9 (ref 5–15)
BUN: 13 mg/dL (ref 8–23)
CO2: 26 mmol/L (ref 22–32)
Calcium: 9.5 mg/dL (ref 8.9–10.3)
Chloride: 107 mmol/L (ref 98–111)
Creatinine, Ser: 0.74 mg/dL (ref 0.44–1.00)
GFR calc Af Amer: >60 mL/min (ref >60)
GFR calc non Af Amer: >60 mL/min (ref >60)
Glucose, Bld: 106 mg/dL — ABNORMAL HIGH (ref 70–99)
Potassium: 3.7 mmol/L (ref 3.5–5.1)
Sodium: 142 mmol/L (ref 135–145)
Total Bilirubin: 0.7 mg/dL (ref 0.3–1.2)
Total Protein: 7.4 g/dL (ref 6.5–8.1)

## 2018-08-16 LAB — TROPONIN I: Troponin I: <0.03 ng/mL (ref <0.03)

## 2018-08-16 MED ORDER — MECLIZINE HCL 25 MG PO TABS
25.0000 mg | ORAL_TABLET | Freq: Once | ORAL | Status: AC
  Administered 2018-08-16: 25 mg via ORAL
  Filled 2018-08-16: qty 1

## 2018-08-16 NOTE — ED Triage Notes (Addendum)
Patient c/o hypertension today after taking BP at home. Reports hx migraines. States "different headache" and "vertigo" this morning. Denies blurred vision or weakness. Reports taking amlodipine as prescribed. Ambulatory. A&Ox4.

## 2018-08-16 NOTE — ED Provider Notes (Signed)
[facility] DEPT Provider Note   CSN: [id number] Arrival date & time: 08/16/18  1932    History   Chief Complaint Chief Complaint  Patient presents with  . Hypertension  . Headache    HPI [name] is a 78 y.o. 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  The patient reports that she awoke at 4:30 AM with room spinning dizziness.  She reports initially that her symptoms were worse with laying on her side and improved with laying on her back.  She reports the episodes will last for approximately 5 to 10 minutes before resolving.  She reports an associated constant, dull, nonradiating, right-sided headache.  She reports she has a history of migraines, but has not had a migraine in more than 6 months and states that the current headache feels different.  She took a dose of her home Maxalt at 1300 with no improvement in her symptoms.  She reports that approximately 1500 that she stood up and felt another episode of dizziness.  She reports that she was attempting to walk down the hall when she felt as if she could not take a deep breath.  She also reports palpitations and felt as if her heart was racing.  She checked her blood pressure, which was 215/100.  She has been compliant with her home amlodipine.  Prior to symptom onset over the last 24 hours, she reports that she was feeling more fatigued over the last few days and is having to lay down and rest more. She reports that now that she is having episodic dizziness with almost all positional changes.  She denies tinnitus, otalgia, fever, neck pain or stiffness, chest pain, cough, nausea, vomiting, diarrhea, abdominal pain, back pain, facial drooping, or slurred speech.  She reports that she was recently on a 5-day  course of Keflex for an insect bite, which she completed on 5/31.  She is also on methotrexate and Valtrex for iridocyclitis of the left eye.   She is followed by Dr. Alvester Chou with cardiology after she had abnormal EKG in 2015 that showed sinus bradycardia and nonspecific ST changes that were thought to be secondary to LVH.  Echo showed preserved LV function.  She was seen in 2018 for near syncope and Holter monitor showed sinus bradycardia and one run of 5 beat nonsustained wide-complex tachycardia.  Echo with EF of 60 to 65% and grade 1 diastolic dysfunction, mild AR, and mild left atrial enlargement.  Bradycardia resolved after medication adjustment.     The history is provided by the patient. No language interpreter was used.    Past Medical History:  Diagnosis Date  . Abnormal EKG    LVH with repolarization changes  . Arthritis   . Headache    migraines   . Hyperlipidemia   . Hyperlipidemia   . Visual disorder    Torn retina    Patient Active Problem List   Diagnosis Date Noted  . Family history of coronary artery disease 06/13/2018  . Dizziness 10/31/2017  . Essential hypertension 08/08/2013  . Hyperlipidemia 08/08/2013  . Abnormal EKG 08/08/2013    Past Surgical History:  Procedure Laterality Date  . APPENDECTOMY    . COLON SURGERY    . DILATATION & CURETTAGE/HYSTEROSCOPY WITH MYOSURE N/A 05/11/2015   Procedure: DILATATION &  CURETTAGE/HYSTEROSCOPY WITH MYOSURE POLYPECTOMY;  Surgeon: Servando Salina, MD;  Location: [facility];  Service: Gynecology;  Laterality: N/A;  . EYE SURGERY    . SHOULDER ARTHROSCOPY    . TONSILLECTOMY       OB History   No obstetric history on file.      Home Medications    Prior to Admission medications   Medication Sig Start Date End Date Taking? Authorizing Provider  acetaminophen (TYLENOL) 500 MG tablet Take 1,000 mg by mouth every 6 (six) hours as needed for headache.    [provider]  amLODipine (NORVASC) 5 MG tablet Take  1 tablet (5 mg total) by mouth daily. 10/31/17 06/13/18  Lorretta Harp, MD  atorvastatin (LIPITOR) 20 MG tablet Take 20 mg by mouth at bedtime.     [provider]  CALCIUM CARBONATE-VITAMIN D PO Take 1,200 Units by mouth daily.    [provider]  cholecalciferol (VITAMIN D) 1000 UNITS tablet Take 1,000 Units by mouth daily.    [provider]  dorzolamide-timolol (COSOPT) 22.3-6.8 MG/ML ophthalmic solution Place 1 drop into the left eye 2 (two) times daily.    [provider]  folic acid (FOLVITE) 1 MG tablet Take 3 mg by mouth daily. 05/29/18   [provider]  Methotrexate 2.5 MG/ML SOLN Take by mouth. Use as directed    [provider]  Multiple Vitamins-Minerals (ICAPS MV PO) Take 1 tablet by mouth 2 (two) times daily.    [provider]  Polyethyl Glycol-Propyl Glycol 0.4-0.3 % SOLN Place 1 drop into the right eye as needed (For dry eyes.).     [provider]  prednisoLONE acetate (PREDNISOLONE ACETATE P-F) 1 % ophthalmic suspension Place 1 drop into the left eye 3 (three) times daily.    [provider]  rizatriptan (MAXALT-MLT) 10 MG disintegrating tablet Take 1 tablet by mouth daily as needed for migraine.  07/08/13   [provider]  triamterene-hydrochlorothiazide (MAXZIDE-25) 37.5-25 MG per tablet Take 0.5 tablets by mouth as needed (For swelling.).  07/01/13   [provider]  valACYclovir (VALTREX) 1000 MG tablet Take 1,000 mg by mouth daily. 05/29/18   [provider]    Family History Family History  Problem Relation Age of Onset  . Stroke Mother 21  . Cancer Mother 73       Breast cancer  . Hypertension Mother   . Breast cancer Mother   . Heart disease Father   . Hypertension Father   . Stroke Father 22  . Heart disease Brother 32  . Cancer Brother        Lung cancer  . Cancer Maternal Grandmother        Breast cancer  . Breast cancer Maternal Grandmother   .  Kidney disease Maternal Grandfather   . Diabetes Paternal Grandmother   . Hyperlipidemia Child   . Breast cancer Maternal Aunt     Social History Social History   Tobacco Use  . Smoking status: Never Smoker  . Smokeless tobacco: Never Used  Substance Use Topics  . Alcohol use: No  . Drug use: No     Allergies   Abreva [docosanol] and Codeine   Review of Systems Review of Systems  Constitutional: Positive for fatigue. Negative for activity change and fever.  HENT: Negative for hearing loss and tinnitus.   Respiratory: Positive for chest tightness. Negative for cough, shortness of breath and wheezing.   Cardiovascular: Positive for palpitations. Negative for chest  pain and leg swelling.  Gastrointestinal: Negative for abdominal pain, diarrhea, nausea and vomiting.  Genitourinary: Negative for dysuria.  Musculoskeletal: Negative for arthralgias, back pain, myalgias, neck pain and neck stiffness.  Skin: Negative for rash.  Allergic/Immunologic: Negative for immunocompromised state.  Neurological: Positive for dizziness and headaches. Negative for weakness and numbness.  Psychiatric/Behavioral: Negative for confusion.     Physical Exam Updated Vital Signs BP 117/76   Pulse 67   Temp 98.2 F (36.8 C)   Resp (!) 22   SpO2 97%   Physical Exam Vitals signs and nursing note reviewed.  Constitutional:      General: She is not in acute distress. HENT:     Head: Normocephalic.  Eyes:     Conjunctiva/sclera: Conjunctivae normal.  Neck:     Musculoskeletal: Neck supple.  Cardiovascular:     Rate and Rhythm: Normal rate and regular rhythm.     Heart sounds: No murmur. No friction rub. No gallop.   Pulmonary:     Effort: Pulmonary effort is normal. No respiratory distress.     Breath sounds: No stridor. No wheezing, rhonchi or rales.  Chest:     Chest wall: No tenderness.  Abdominal:     General: There is no distension.     Palpations: Abdomen is soft. There is no  mass.     Tenderness: There is no abdominal tenderness. There is no right CVA tenderness, left CVA tenderness, guarding or rebound.     Hernia: No hernia is present.  Skin:    General: Skin is warm.     Findings: No rash.  Neurological:     Mental Status: She is alert and oriented to person, place, and time.     Comments: Alert and oriented x3.  Cranial nerves II through XII are grossly intact.  No clonus bilaterally.  Finger-to-nose and heel-to-shin are intact bilaterally.  Sensation is intact and equal throughout.  5 of 5 strength against resistance of the bilateral upper and lower extremities.  Gait is not ataxic.  Negative Romberg.  No pronator drift.  Normal rapid alternating movements.  Psychiatric:        Behavior: Behavior normal.      ED Treatments / Results  Labs (all labs ordered are listed, but only abnormal results are displayed) Labs Reviewed  CBC - Abnormal; Notable for the following components:      Result Value   Hemoglobin 15.5 (*)    HCT 47.6 (*)    All other components within normal limits  COMPREHENSIVE METABOLIC PANEL - Abnormal; Notable for the following components:   Glucose, Bld 106 (*)    All other components within normal limits  URINALYSIS, ROUTINE W REFLEX MICROSCOPIC - Abnormal; Notable for the following components:   Ketones, ur 5 (*)    All other components within normal limits  SARS CORONAVIRUS 2 (HOSPITAL ORDER, [place])  TROPONIN I    EKG EKG Interpretation  Date/Time:  Thursday August 16 2018 23:03:20 EDT Ventricular Rate:  70 PR Interval:    QRS Duration: 82 QT Interval:  392 QTC Calculation: 423 R Axis:   61 Text Interpretation:  Sinus rhythm Supraventricular bigeminy Anteroseptal infarct, old Nonspecific repol abnormality, diffuse leads Confirmed by Sherwood Gambler [phone]) on 08/16/2018 11:57:32 PM   Radiology Dg Chest 2 View  Result Date: 08/16/2018 CLINICAL DATA:  Initial evaluation for acute  palpitations, chest tightness. EXAM: CHEST - 2 VIEW COMPARISON:  Prior radiograph from 11/05/2009. FINDINGS: Cardiac  and mediastinal silhouettes are stable in size and contour, and remain within normal limits. Lungs are hypoinflated. Lungs are clear without focal infiltrate, pulmonary edema, or pleural effusion. No pneumothorax. No acute osseous finding. IMPRESSION: Shallow lung inflation with no active cardiopulmonary disease identified. Electronically Signed   By: Jeannine Boga M.D.   On: 08/16/2018 23:34   Ct Head Wo Contrast  Result Date: 08/16/2018 CLINICAL DATA:  Hypertension today. EXAM: CT HEAD WITHOUT CONTRAST TECHNIQUE: Contiguous axial images were obtained from the base of the skull through the vertex without intravenous contrast. COMPARISON:  Brain MR dated 03/13/2017 and head CT dated 06/17/2014. FINDINGS: Brain: Stable mildly enlarged subarachnoid spaces and normal size and position of the ventricles. Minimal patchy white matter low density in both cerebral hemispheres. No intracranial hemorrhage, mass lesion or CT evidence of acute infarction. Vascular: No hyperdense vessel or unexpected calcification. Skull: Normal. Negative for fracture or focal lesion. Sinuses/Orbits: Status post bilateral cataract extraction. Unremarkable paranasal sinuses. Other: Right temporomandibular joint degenerative changes. IMPRESSION: 1. No acute abnormality. 2. Stable mild diffuse cerebral and cerebellar atrophy and minimal chronic small vessel white matter ischemic changes in both cerebral hemispheres. 3. Right TMJ degenerative changes. Electronically Signed   By: Claudie Revering M.D.   On: 08/16/2018 20:40    Procedures Procedures (including critical care time)  Medications Ordered in ED Medications  LORazepam (ATIVAN) injection 0.5 mg (has no administration in time range)  meclizine (ANTIVERT) tablet 25 mg (25 mg Oral Given 08/16/18 2251)     Initial Impression / Assessment and Plan / ED Course  I  have reviewed the triage vital signs and the nursing notes.  Pertinent labs & imaging results that were available during my care of the patient were reviewed by me and considered in my medical decision making (see chart for details).        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  She also reports that she has had difficulty taking a deep breath.  Reports that she has been feeling more fatigued over the last few days.  No constitutional symptoms.  The patient was seen and independently evaluated by Dr. Verner Chol, attending physician.  EKG unchanged from previous.  Troponin is negative and chest x-ray is unremarkable so suspicion is low for ACS or hypertensive related chest pain.  Labs are otherwise reassuring.  Urinalysis is pending.  Patient's blood pressure on arrival was 168/102, increasing to 200/99.  Antihypertensives deferred at this time, but patient's BP significantly decreased to 130-140s/80-90s after being given meclizine.  She also reports that she has had no additional episodes of dizziness since being given meclizine.  Spoke with Dr. Lorraine Lax given the patient's age and risk factors will recommend MR brain to assess for posterior CVA.  As MRI is not available at this time, patient will be transferred to [facility] for MRI.  PRN Ativan has been ordered.    Dr. Christy Gentles at [facility] has accepted the patient for transfer.  If there are acute findings on MRI, Dr. Lorraine Lax has asked to be reconsulted. If negative patient can likely be discharged to home; he recommends treating the patient for severe BPPV with meclizine and follow-up outpatient.    [facility] has requested COVID-19 test prior to transfer, which has been ordered and is negative. The patient appears  reasonably stabilized  for transfer considering the current resources, flow, and capabilities available in the ED at this time, and I doubt any other [facility] requiring further screening and/or treatment in the ED prior to transfer.   Final Clinical Impressions(s) / ED Diagnoses   Final diagnoses:  None    ED Discharge Orders    None       Vessie Olmsted A, PA-C 08/17/18 0245    Sherwood Gambler, MD 08/23/18 [phone]

## 2018-08-16 NOTE — ED Notes (Signed)
Pt transported to Xray. 

## 2018-08-17 ENCOUNTER — Other Ambulatory Visit: Payer: Self-pay

## 2018-08-17 ENCOUNTER — Emergency Department (HOSPITAL_COMMUNITY): Payer: Medicare Other

## 2018-08-17 ENCOUNTER — Encounter (HOSPITAL_COMMUNITY): Payer: Self-pay | Admitting: Emergency Medicine

## 2018-08-17 DIAGNOSIS — Z20828 Contact with and (suspected) exposure to other viral communicable diseases: Secondary | ICD-10-CM | POA: Diagnosis not present

## 2018-08-17 DIAGNOSIS — H8110 Benign paroxysmal vertigo, unspecified ear: Secondary | ICD-10-CM | POA: Diagnosis not present

## 2018-08-17 DIAGNOSIS — Z79899 Other long term (current) drug therapy: Secondary | ICD-10-CM | POA: Diagnosis not present

## 2018-08-17 DIAGNOSIS — R110 Nausea: Secondary | ICD-10-CM | POA: Diagnosis not present

## 2018-08-17 DIAGNOSIS — R42 Dizziness and giddiness: Secondary | ICD-10-CM | POA: Diagnosis not present

## 2018-08-17 DIAGNOSIS — I10 Essential (primary) hypertension: Secondary | ICD-10-CM | POA: Diagnosis not present

## 2018-08-17 DIAGNOSIS — R030 Elevated blood-pressure reading, without diagnosis of hypertension: Secondary | ICD-10-CM | POA: Diagnosis not present

## 2018-08-17 LAB — URINALYSIS, ROUTINE W REFLEX MICROSCOPIC
Bilirubin Urine: NEGATIVE
Glucose, UA: NEGATIVE mg/dL
Hgb urine dipstick: NEGATIVE
Ketones, ur: 5 mg/dL — AB
Leukocytes,Ua: NEGATIVE
Nitrite: NEGATIVE
Protein, ur: NEGATIVE mg/dL
Specific Gravity, Urine: 1.006 (ref 1.005–1.030)
pH: 7 (ref 5.0–8.0)

## 2018-08-17 LAB — SARS CORONAVIRUS 2 BY RT PCR (HOSPITAL ORDER, PERFORMED IN ~~LOC~~ HOSPITAL LAB): SARS Coronavirus 2: NEGATIVE

## 2018-08-17 MED ORDER — LORAZEPAM 2 MG/ML IJ SOLN
0.5000 mg | Freq: Once | INTRAMUSCULAR | Status: AC | PRN
  Administered 2018-08-17: 0.5 mg via INTRAVENOUS
  Filled 2018-08-17: qty 1

## 2018-08-17 MED ORDER — MECLIZINE HCL 25 MG PO TABS: 25.0000 mg | ORAL_TABLET | Freq: Two times a day (BID) | ORAL | 0 refills | Status: DC

## 2018-08-17 MED ORDER — LORAZEPAM 2 MG/ML IJ SOLN: 0.5000 mg | Freq: Once | INTRAMUSCULAR | Status: DC

## 2018-08-17 NOTE — ED Notes (Signed)
[facility] called for transport 

## 2018-08-17 NOTE — ED Provider Notes (Signed)
Patient transferred to [facility] for MRI due to dizziness.  Patient has had dizziness that started yesterday morning.  She states that whenever she turns her head or rolls over she feels very dizzy.  Neurology was consulted by previous team, who recommends MRI brain.  If negative, patient can be discharged home with meclizine.  If positive, will get back in touch with neurology.  MRI is negative.  Patient ambulates in the emergency department.  Her vital signs are stable.  Will discharge home with meclizine per plan.  Return precautions discussed.   Montine Circle, PA-C 08/17/18 0510    Merrily Pew, MD 08/17/18 [phone]

## 2018-08-17 NOTE — ED Notes (Signed)
Patient transported to MRI 

## 2018-08-17 NOTE — ED Notes (Signed)
Report given to [facility] RN. 

## 2018-08-17 NOTE — ED Triage Notes (Signed)
Pt presents to [facility] after being seen at [facility] for htn and dizziness. Pt presents here to get an MRI.

## 2018-08-17 NOTE — ED Notes (Signed)
Pt walking well

## 2018-08-17 NOTE — ED Notes (Signed)
[facility] called for transport. [facility] will not transport until COVID test results. Will call back with result.

## 2018-08-17 NOTE — ED Notes (Signed)
Pt back to room.

## 2018-08-24 DIAGNOSIS — I10 Essential (primary) hypertension: Secondary | ICD-10-CM | POA: Diagnosis not present

## 2018-08-24 DIAGNOSIS — H8110 Benign paroxysmal vertigo, unspecified ear: Secondary | ICD-10-CM | POA: Diagnosis not present

## 2018-08-28 DIAGNOSIS — R42 Dizziness and giddiness: Secondary | ICD-10-CM | POA: Diagnosis not present

## 2018-08-31 DIAGNOSIS — R42 Dizziness and giddiness: Secondary | ICD-10-CM | POA: Diagnosis not present

## 2018-09-12 DIAGNOSIS — R42 Dizziness and giddiness: Secondary | ICD-10-CM | POA: Diagnosis not present

## 2018-09-17 DIAGNOSIS — H40021 Open angle with borderline findings, high risk, right eye: Secondary | ICD-10-CM | POA: Diagnosis not present

## 2018-09-17 DIAGNOSIS — H4052X2 Glaucoma secondary to other eye disorders, left eye, moderate stage: Secondary | ICD-10-CM | POA: Diagnosis not present

## 2018-10-09 DIAGNOSIS — H35372 Puckering of macula, left eye: Secondary | ICD-10-CM | POA: Diagnosis not present

## 2018-10-09 DIAGNOSIS — H4052X2 Glaucoma secondary to other eye disorders, left eye, moderate stage: Secondary | ICD-10-CM | POA: Diagnosis not present

## 2018-10-09 DIAGNOSIS — Z961 Presence of intraocular lens: Secondary | ICD-10-CM | POA: Diagnosis not present

## 2018-10-09 DIAGNOSIS — H209 Unspecified iridocyclitis: Secondary | ICD-10-CM | POA: Diagnosis not present

## 2018-10-09 DIAGNOSIS — Z79899 Other long term (current) drug therapy: Secondary | ICD-10-CM | POA: Diagnosis not present

## 2018-10-09 DIAGNOSIS — H40021 Open angle with borderline findings, high risk, right eye: Secondary | ICD-10-CM | POA: Diagnosis not present

## 2018-10-16 DIAGNOSIS — Z7982 Long term (current) use of aspirin: Secondary | ICD-10-CM | POA: Diagnosis not present

## 2018-10-16 DIAGNOSIS — M810 Age-related osteoporosis without current pathological fracture: Secondary | ICD-10-CM | POA: Diagnosis not present

## 2018-10-16 DIAGNOSIS — E7800 Pure hypercholesterolemia, unspecified: Secondary | ICD-10-CM | POA: Diagnosis not present

## 2018-10-23 DIAGNOSIS — I10 Essential (primary) hypertension: Secondary | ICD-10-CM | POA: Diagnosis not present

## 2018-10-23 DIAGNOSIS — Z0000 Encounter for general adult medical examination without abnormal findings: Secondary | ICD-10-CM | POA: Diagnosis not present

## 2018-10-23 DIAGNOSIS — C44310 Basal cell carcinoma of skin of unspecified parts of face: Secondary | ICD-10-CM | POA: Diagnosis not present

## 2018-10-23 DIAGNOSIS — E7800 Pure hypercholesterolemia, unspecified: Secondary | ICD-10-CM | POA: Diagnosis not present

## 2018-10-23 DIAGNOSIS — K219 Gastro-esophageal reflux disease without esophagitis: Secondary | ICD-10-CM | POA: Diagnosis not present

## 2018-10-23 DIAGNOSIS — J309 Allergic rhinitis, unspecified: Secondary | ICD-10-CM | POA: Diagnosis not present

## 2018-10-23 DIAGNOSIS — M810 Age-related osteoporosis without current pathological fracture: Secondary | ICD-10-CM | POA: Diagnosis not present

## 2018-10-23 DIAGNOSIS — M159 Polyosteoarthritis, unspecified: Secondary | ICD-10-CM | POA: Diagnosis not present

## 2018-10-23 DIAGNOSIS — K5130 Ulcerative (chronic) rectosigmoiditis without complications: Secondary | ICD-10-CM | POA: Diagnosis not present

## 2018-10-25 ENCOUNTER — Other Ambulatory Visit: Payer: Self-pay | Admitting: Cardiovascular Disease

## 2018-10-26 DIAGNOSIS — Z961 Presence of intraocular lens: Secondary | ICD-10-CM | POA: Diagnosis not present

## 2018-10-31 DIAGNOSIS — R152 Fecal urgency: Secondary | ICD-10-CM | POA: Diagnosis not present

## 2018-10-31 DIAGNOSIS — Z8719 Personal history of other diseases of the digestive system: Secondary | ICD-10-CM | POA: Diagnosis not present

## 2018-10-31 DIAGNOSIS — Z86010 Personal history of colonic polyps: Secondary | ICD-10-CM | POA: Diagnosis not present

## 2018-10-31 DIAGNOSIS — R159 Full incontinence of feces: Secondary | ICD-10-CM | POA: Diagnosis not present

## 2018-11-02 DIAGNOSIS — H019 Unspecified inflammation of eyelid: Secondary | ICD-10-CM | POA: Diagnosis not present

## 2018-11-28 DIAGNOSIS — Z1159 Encounter for screening for other viral diseases: Secondary | ICD-10-CM | POA: Diagnosis not present

## 2018-11-29 DIAGNOSIS — H6123 Impacted cerumen, bilateral: Secondary | ICD-10-CM | POA: Diagnosis not present

## 2018-12-03 DIAGNOSIS — Z86010 Personal history of colonic polyps: Secondary | ICD-10-CM | POA: Diagnosis not present

## 2018-12-03 DIAGNOSIS — K5730 Diverticulosis of large intestine without perforation or abscess without bleeding: Secondary | ICD-10-CM | POA: Diagnosis not present

## 2018-12-07 DIAGNOSIS — Z23 Encounter for immunization: Secondary | ICD-10-CM | POA: Diagnosis not present

## 2018-12-14 ENCOUNTER — Encounter: Payer: Self-pay | Admitting: Cardiovascular Disease

## 2018-12-14 ENCOUNTER — Other Ambulatory Visit: Payer: Self-pay

## 2018-12-14 ENCOUNTER — Ambulatory Visit (INDEPENDENT_AMBULATORY_CARE_PROVIDER_SITE_OTHER): Payer: Medicare Other | Admitting: Cardiovascular Disease

## 2018-12-14 DIAGNOSIS — E782 Mixed hyperlipidemia: Secondary | ICD-10-CM | POA: Diagnosis not present

## 2018-12-14 DIAGNOSIS — R9431 Abnormal electrocardiogram [ECG] [EKG]: Secondary | ICD-10-CM | POA: Diagnosis not present

## 2018-12-14 DIAGNOSIS — I10 Essential (primary) hypertension: Secondary | ICD-10-CM | POA: Diagnosis not present

## 2018-12-14 NOTE — Progress Notes (Signed)
12/14/2018 [name]   [birth year]  [id number]  Primary Physician Kim Pretty, MD Primary Cardiologist: Kim Harp MD [name], [place]  HPI:  [name] is a 78 y.o.   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 She was referred by Dr. Dr. Deland Jimenez at [facility] initially for evaluation of generalized fatigue and an abnormal EKG. I last saw her in the office 10/31/2017.  Her cardiovascular risk factor profile is remarkable for hypertension, hyperlipidemia and family history. Her father had heart-related issues in the 62s and a brother did have a stent before he died of lung cancer in his 19s. She has never had a heart attack or stroke. She does complain of dyspnea but denies chest pain. She was referred for an abnormal EKG with ST segment changes. She had a subsequent 2D echocardiogram after her office visit performed 08/14/2013 that was essentially normal except for mild AI.  Since I saw her 4 years ago she is been referred back for 6 episodes of presyncope which began several months ago.  She does feel palpitations during these and get some shortness of breath as well as weakness after they subside.  Holter monitor showed sinus rhythm consult sinus sinus bradycardia with occasional PVCs and occasional short atrial runs which do not appear to be contributory.    Since I saw her in the office a year ago she is remained stable.  She was seen in the ER this past summer for vertigo and was hypertensive but this was an isolated event.  She otherwise denies chest pain or shortness of breath. Current Meds  Medication Sig  . acetaminophen (TYLENOL) 500 MG tablet Take 1,000 mg by mouth every 6 (six) hours as needed for headache.  Marland Kitchen amLODipine (NORVASC) 5 MG tablet TAKE 1 TABLET EACH DAY.  Marland Kitchen atorvastatin (LIPITOR) 20 MG tablet  Take 20 mg by mouth at bedtime.   Marland Kitchen CALCIUM CARBONATE-VITAMIN D PO Take 1,200 Units by mouth daily.  . cholecalciferol (VITAMIN D) 1000 UNITS tablet Take 1,000 Units by mouth daily.  . dorzolamide-timolol (COSOPT) 22.3-6.8 MG/ML ophthalmic solution Place 1 drop into the left eye 2 (two) times daily.  . folic acid (FOLVITE) 1 MG tablet Take 3 mg by mouth daily.  . meclizine (ANTIVERT) 25 MG tablet Take 1 tablet (25 mg total) by mouth 2 (two) times daily.  . Methotrexate 2.5 MG/ML SOLN Take 12.5 mg/mL by mouth. Use as directed   . Multiple Vitamins-Minerals (ICAPS MV PO) Take 1 tablet by mouth 2 (two) times daily.  [name] Glycol-Propyl Glycol 0.4-0.3 % SOLN Place 1 drop into the right eye as needed (For dry eyes.).   Marland Kitchen prednisoLONE acetate (PREDNISOLONE ACETATE P-F) 1 % ophthalmic suspension Place 1 drop into the left eye 3 (three) times daily.  . rizatriptan (MAXALT-MLT) 10 MG disintegrating tablet Take 1 tablet by mouth daily as needed for migraine.   . triamterene-hydrochlorothiazide (MAXZIDE-25) 37.5-25 MG per tablet Take 0.5 tablets by mouth as needed (For swelling.).   Marland Kitchen valACYclovir (VALTREX) 1000 MG tablet Take 1,000 mg by mouth daily.     Allergies  Allergen Reactions  . Abreva [Docosanol] Swelling and Other (See Comments)    Top lip and side of face swollen  . Codeine Nausea Only    Social History   Socioeconomic History  .  Marital status: Widowed    Spouse name: Not on file  . Number of children: Not on file  . Years of education: Not on file  . Highest education level: Not on file  Occupational History  . Not on file  Social Needs  . Financial resource strain: Not on file  . Food insecurity    Worry: Not on file    Inability: Not on file  . Transportation needs    Medical: Not on file    Non-medical: Not on file  Tobacco Use  . Smoking status: Never Smoker  . Smokeless tobacco: Never Used  Substance and Sexual Activity  . Alcohol use: No  . Drug use: No  .  Sexual activity: Not on file  Lifestyle  . Physical activity    Days per week: Not on file    Minutes per session: Not on file  . Stress: Not on file  Relationships  . Social Herbalist on phone: Not on file    Gets together: Not on file    Attends religious service: Not on file    Active member of club or organization: Not on file    Attends meetings of clubs or organizations: Not on file    Relationship status: Not on file  . Intimate partner violence    Fear of current or ex partner: Not on file    Emotionally abused: Not on file    Physically abused: Not on file    Forced sexual activity: Not on file  Other Topics Concern  . Not on file  Social History Narrative  . Not on file     Review of Systems: General: negative for chills, fever, night sweats or weight changes.  Cardiovascular: negative for chest pain, dyspnea on exertion, edema, orthopnea, palpitations, paroxysmal nocturnal dyspnea or shortness of breath Dermatological: negative for rash Respiratory: negative for cough or wheezing Urologic: negative for hematuria Abdominal: negative for nausea, vomiting, diarrhea, bright red blood per rectum, melena, or hematemesis Neurologic: negative for visual changes, syncope, or dizziness All other systems reviewed and are otherwise negative except as noted above.    Blood pressure 140/68, pulse 61, temperature (!) 97.3 F (36.3 C), height 5\' 4"  (1.626 m), weight 150 lb 9.6 oz (68.3 kg), SpO2 91 %.  General appearance: alert and no distress Neck: no adenopathy, no carotid bruit, no JVD, supple, symmetrical, trachea midline and thyroid not enlarged, symmetric, no tenderness/mass/nodules Lungs: clear to auscultation bilaterally Heart: regular rate and rhythm, S1, S2 normal, no murmur, click, rub or gallop Extremities: extremities normal, atraumatic, no cyanosis or edema Pulses: 2+ and symmetric Skin: Skin color, texture, turgor normal. No rashes or lesions  Neurologic: Alert and oriented X 3, normal strength and tone. Normal symmetric reflexes. Normal coordination and gait  EKG not performed today  ASSESSMENT AND PLAN:   Essential hypertension History of essential hypertension with blood pressure measured today at 140/68.  She is on amlodipine and Maxide.  Hyperlipidemia History of hyperlipidemia on statin therapy followed by her PCP  Abnormal EKG History of an abnormal EKG with nonspecific ST and T wave changes.      Kim Harp MD FACP,FACC,FAHA, [facility] 12/14/2018 4:22 PM

## 2018-12-14 NOTE — Patient Instructions (Signed)
Medication Instructions:  Your physician recommends that you continue on your current medications as directed. Please refer to the Current Medication list given to you today.  If you need a refill on your cardiac medications before your next appointment, please call your pharmacy.   Lab work: PLEASE FOLLOW UP WITH DR. Thayer Jew PHARR'S OFFICE REGARDING LIPID PROFILE RESULTS. THESE RESULTS CAN BE FAXED TO OUR OFFICE AT [phone]  IF YOUR LIPID PROFILE LAB WORK WAS NOT COMPLETED IN DR. PHARR'S OFFICE DURING YOUR LAST VISIT, PLEASE PRESENT TO HEARTCARE AT [facility] 1 WEEK FOR LAB WORK: LIPID AND LIVER PANELS  If you have labs (blood work) drawn today and your tests are completely normal, you will receive your results only by: Marland Kitchen MyChart Message (if you have MyChart) OR . A paper copy in the mail If you have any lab test that is abnormal or we need to change your treatment, we will call you to review the results.  Testing/Procedures: NONE  Follow-Up: At [facility], you and your health needs are our priority.  As part of our continuing mission to provide you with exceptional heart care, we have created designated Provider Care Teams.  These Care Teams include your primary Cardiologist (physician) and Advanced Practice Providers (APPs -  Physician Assistants and Nurse Practitioners) who all work together to provide you with the care you need, when you need it. You will need a follow up appointment in 12 months with Dr. Quay Burow.  Please call our office 2 months in advance to schedule this appointment.

## 2018-12-14 NOTE — Assessment & Plan Note (Signed)
History of essential hypertension with blood pressure measured today at 140/68.  She is on amlodipine and Maxide.

## 2018-12-14 NOTE — Assessment & Plan Note (Signed)
History of an abnormal EKG with nonspecific ST and T wave changes.

## 2018-12-14 NOTE — Assessment & Plan Note (Signed)
History of hyperlipidemia on statin therapy followed by her PCP. 

## 2018-12-18 DIAGNOSIS — H35372 Puckering of macula, left eye: Secondary | ICD-10-CM | POA: Diagnosis not present

## 2018-12-18 DIAGNOSIS — Z5181 Encounter for therapeutic drug level monitoring: Secondary | ICD-10-CM | POA: Diagnosis not present

## 2018-12-18 DIAGNOSIS — Z79899 Other long term (current) drug therapy: Secondary | ICD-10-CM | POA: Diagnosis not present

## 2018-12-18 DIAGNOSIS — H019 Unspecified inflammation of eyelid: Secondary | ICD-10-CM | POA: Diagnosis not present

## 2018-12-18 DIAGNOSIS — H4052X2 Glaucoma secondary to other eye disorders, left eye, moderate stage: Secondary | ICD-10-CM | POA: Diagnosis not present

## 2018-12-18 DIAGNOSIS — H209 Unspecified iridocyclitis: Secondary | ICD-10-CM | POA: Diagnosis not present

## 2018-12-18 DIAGNOSIS — Z961 Presence of intraocular lens: Secondary | ICD-10-CM | POA: Diagnosis not present

## 2018-12-18 DIAGNOSIS — H40021 Open angle with borderline findings, high risk, right eye: Secondary | ICD-10-CM | POA: Diagnosis not present

## 2018-12-26 ENCOUNTER — Telehealth: Payer: Self-pay | Admitting: *Deleted

## 2018-12-26 NOTE — Telephone Encounter (Signed)
A message was left, re: her follow up visit. 

## 2018-12-27 DIAGNOSIS — Z124 Encounter for screening for malignant neoplasm of cervix: Secondary | ICD-10-CM | POA: Diagnosis not present

## 2019-01-08 DIAGNOSIS — K591 Functional diarrhea: Secondary | ICD-10-CM | POA: Diagnosis not present

## 2019-01-09 ENCOUNTER — Other Ambulatory Visit: Payer: Self-pay | Admitting: Internal Medicine

## 2019-01-09 DIAGNOSIS — Z1231 Encounter for screening mammogram for malignant neoplasm of breast: Secondary | ICD-10-CM

## 2019-01-14 DIAGNOSIS — H029 Unspecified disorder of eyelid: Secondary | ICD-10-CM | POA: Diagnosis not present

## 2019-01-14 DIAGNOSIS — C441191 Basal cell carcinoma of skin of left upper eyelid, including canthus: Secondary | ICD-10-CM | POA: Diagnosis not present

## 2019-01-22 ENCOUNTER — Ambulatory Visit: Payer: Medicare Other | Admitting: Cardiovascular Disease

## 2019-01-24 DIAGNOSIS — Z4802 Encounter for removal of sutures: Secondary | ICD-10-CM | POA: Diagnosis not present

## 2019-01-24 DIAGNOSIS — C441191 Basal cell carcinoma of skin of left upper eyelid, including canthus: Secondary | ICD-10-CM | POA: Diagnosis not present

## 2019-01-24 DIAGNOSIS — Z48810 Encounter for surgical aftercare following surgery on the sense organs: Secondary | ICD-10-CM | POA: Diagnosis not present

## 2019-01-29 ENCOUNTER — Other Ambulatory Visit: Payer: Self-pay

## 2019-01-29 DIAGNOSIS — Z20822 Contact with and (suspected) exposure to covid-19: Secondary | ICD-10-CM

## 2019-01-29 DIAGNOSIS — Z20828 Contact with and (suspected) exposure to other viral communicable diseases: Secondary | ICD-10-CM | POA: Diagnosis not present

## 2019-01-31 LAB — NOVEL CORONAVIRUS, NAA: SARS-CoV-2, NAA: NOT DETECTED

## 2019-02-06 ENCOUNTER — Other Ambulatory Visit: Payer: Self-pay

## 2019-02-06 DIAGNOSIS — Z20822 Contact with and (suspected) exposure to covid-19: Secondary | ICD-10-CM

## 2019-02-06 DIAGNOSIS — Z20828 Contact with and (suspected) exposure to other viral communicable diseases: Secondary | ICD-10-CM | POA: Diagnosis not present

## 2019-02-07 LAB — NOVEL CORONAVIRUS, NAA: SARS-CoV-2, NAA: NOT DETECTED

## 2019-02-18 DIAGNOSIS — H40021 Open angle with borderline findings, high risk, right eye: Secondary | ICD-10-CM | POA: Diagnosis not present

## 2019-02-18 DIAGNOSIS — H4052X2 Glaucoma secondary to other eye disorders, left eye, moderate stage: Secondary | ICD-10-CM | POA: Diagnosis not present

## 2019-03-01 ENCOUNTER — Other Ambulatory Visit: Payer: Self-pay

## 2019-03-01 ENCOUNTER — Ambulatory Visit
Admission: RE | Admit: 2019-03-01 | Discharge: 2019-03-01 | Disposition: A | Discharge status: Home / Self Care | Payer: Medicare Other | Source: Ambulatory Visit | Attending: Internal Medicine | Admitting: Internal Medicine

## 2019-03-01 DIAGNOSIS — Z1231 Encounter for screening mammogram for malignant neoplasm of breast: Secondary | ICD-10-CM

## 2019-03-19 DIAGNOSIS — H35372 Puckering of macula, left eye: Secondary | ICD-10-CM | POA: Diagnosis not present

## 2019-03-19 DIAGNOSIS — H4052X2 Glaucoma secondary to other eye disorders, left eye, moderate stage: Secondary | ICD-10-CM | POA: Diagnosis not present

## 2019-03-19 DIAGNOSIS — C441191 Basal cell carcinoma of skin of left upper eyelid, including canthus: Secondary | ICD-10-CM | POA: Diagnosis not present

## 2019-03-19 DIAGNOSIS — H029 Unspecified disorder of eyelid: Secondary | ICD-10-CM | POA: Diagnosis not present

## 2019-03-19 DIAGNOSIS — Z961 Presence of intraocular lens: Secondary | ICD-10-CM | POA: Diagnosis not present

## 2019-03-19 DIAGNOSIS — H40021 Open angle with borderline findings, high risk, right eye: Secondary | ICD-10-CM | POA: Diagnosis not present

## 2019-03-19 DIAGNOSIS — H209 Unspecified iridocyclitis: Secondary | ICD-10-CM | POA: Diagnosis not present

## 2019-03-19 DIAGNOSIS — Z79899 Other long term (current) drug therapy: Secondary | ICD-10-CM | POA: Diagnosis not present

## 2019-04-12 DIAGNOSIS — Z48810 Encounter for surgical aftercare following surgery on the sense organs: Secondary | ICD-10-CM | POA: Diagnosis not present

## 2019-04-12 DIAGNOSIS — C441191 Basal cell carcinoma of skin of left upper eyelid, including canthus: Secondary | ICD-10-CM | POA: Diagnosis not present

## 2019-04-17 ENCOUNTER — Other Ambulatory Visit: Payer: Self-pay | Admitting: Cardiovascular Disease

## 2019-05-09 ENCOUNTER — Ambulatory Visit: Payer: Medicare Other | Attending: Internal Medicine

## 2019-05-09 DIAGNOSIS — Z23 Encounter for immunization: Secondary | ICD-10-CM

## 2019-05-09 NOTE — Progress Notes (Signed)
   Covid-19 Vaccination Clinic  Name:  [name]    MRN: [id number] DOB: 1940-04-20  05/09/2019  [name] was observed post Covid-19 immunization for 15 minutes without incidence. She was provided with Vaccine Information Sheet and instruction to access the V-Safe system.   [name] was instructed to call 911 with any severe reactions post vaccine: Marland Kitchen Difficulty breathing  . Swelling of your face and throat  . A fast heartbeat  . A bad rash all over your body  . Dizziness and weakness    Immunizations Administered    Name Date Dose VIS Date Route   Pfizer COVID-19 Vaccine 05/09/2019  3:45 PM 0.3 mL 02/22/2019 Intramuscular   Manufacturer: [place]   Lot: [id number]   [place]: [id number]

## 2019-05-20 DIAGNOSIS — H4052X2 Glaucoma secondary to other eye disorders, left eye, moderate stage: Secondary | ICD-10-CM | POA: Diagnosis not present

## 2019-05-28 DIAGNOSIS — H029 Unspecified disorder of eyelid: Secondary | ICD-10-CM | POA: Diagnosis not present

## 2019-05-28 DIAGNOSIS — H209 Unspecified iridocyclitis: Secondary | ICD-10-CM | POA: Diagnosis not present

## 2019-05-28 DIAGNOSIS — Z961 Presence of intraocular lens: Secondary | ICD-10-CM | POA: Diagnosis not present

## 2019-05-28 DIAGNOSIS — Z5181 Encounter for therapeutic drug level monitoring: Secondary | ICD-10-CM | POA: Diagnosis not present

## 2019-05-28 DIAGNOSIS — C441191 Basal cell carcinoma of skin of left upper eyelid, including canthus: Secondary | ICD-10-CM | POA: Diagnosis not present

## 2019-05-28 DIAGNOSIS — H35372 Puckering of macula, left eye: Secondary | ICD-10-CM | POA: Diagnosis not present

## 2019-05-28 DIAGNOSIS — H40021 Open angle with borderline findings, high risk, right eye: Secondary | ICD-10-CM | POA: Diagnosis not present

## 2019-05-28 DIAGNOSIS — Z79899 Other long term (current) drug therapy: Secondary | ICD-10-CM | POA: Diagnosis not present

## 2019-05-28 DIAGNOSIS — H4052X2 Glaucoma secondary to other eye disorders, left eye, moderate stage: Secondary | ICD-10-CM | POA: Diagnosis not present

## 2019-05-29 DIAGNOSIS — H6123 Impacted cerumen, bilateral: Secondary | ICD-10-CM | POA: Diagnosis not present

## 2019-06-04 ENCOUNTER — Ambulatory Visit: Payer: Medicare Other | Attending: Internal Medicine

## 2019-06-04 DIAGNOSIS — Z23 Encounter for immunization: Secondary | ICD-10-CM

## 2019-06-04 NOTE — Progress Notes (Signed)
   Covid-19 Vaccination Clinic  Name:  [name]    MRN: [id number] DOB: 01/20/1941  06/04/2019  Ms. [name] was observed post Covid-19 immunization for 30 minutes based on pre-vaccination screening without incident. She was provided with Vaccine Information Sheet and instruction to access the V-Safe system.   Ms. [name] was instructed to call 911 with any severe reactions post vaccine: Marland Kitchen Difficulty breathing  . Swelling of face and throat  . A fast heartbeat  . A bad rash all over body  . Dizziness and weakness   Immunizations Administered    Name Date Dose VIS Date Route   Pfizer COVID-19 Vaccine 06/04/2019  9:46 AM 0.3 mL 02/22/2019 Intramuscular   Manufacturer: [place]   Lot: [id number]   [place]: [id number]

## 2019-06-27 DIAGNOSIS — M26629 Arthralgia of temporomandibular joint, unspecified side: Secondary | ICD-10-CM | POA: Diagnosis not present

## 2019-06-27 DIAGNOSIS — R221 Localized swelling, mass and lump, neck: Secondary | ICD-10-CM | POA: Diagnosis not present

## 2019-07-10 DIAGNOSIS — Z9089 Acquired absence of other organs: Secondary | ICD-10-CM | POA: Diagnosis not present

## 2019-07-10 DIAGNOSIS — J343 Hypertrophy of nasal turbinates: Secondary | ICD-10-CM | POA: Diagnosis not present

## 2019-07-10 DIAGNOSIS — R1313 Dysphagia, pharyngeal phase: Secondary | ICD-10-CM | POA: Diagnosis not present

## 2019-07-11 ENCOUNTER — Other Ambulatory Visit: Payer: Self-pay | Admitting: Otolaryngology

## 2019-07-11 DIAGNOSIS — R1313 Dysphagia, pharyngeal phase: Secondary | ICD-10-CM

## 2019-07-11 DIAGNOSIS — Z9889 Other specified postprocedural states: Secondary | ICD-10-CM | POA: Diagnosis not present

## 2019-07-11 DIAGNOSIS — Z48810 Encounter for surgical aftercare following surgery on the sense organs: Secondary | ICD-10-CM | POA: Diagnosis not present

## 2019-07-12 ENCOUNTER — Ambulatory Visit
Admission: RE | Admit: 2019-07-12 | Discharge: 2019-07-12 | Disposition: A | Discharge status: Home / Self Care | Payer: Medicare Other | Source: Ambulatory Visit | Attending: Otolaryngology | Admitting: Otolaryngology

## 2019-07-12 DIAGNOSIS — R1313 Dysphagia, pharyngeal phase: Secondary | ICD-10-CM

## 2019-07-12 DIAGNOSIS — K449 Diaphragmatic hernia without obstruction or gangrene: Secondary | ICD-10-CM | POA: Diagnosis not present

## 2019-08-20 DIAGNOSIS — Z961 Presence of intraocular lens: Secondary | ICD-10-CM | POA: Diagnosis not present

## 2019-08-20 DIAGNOSIS — H029 Unspecified disorder of eyelid: Secondary | ICD-10-CM | POA: Diagnosis not present

## 2019-08-20 DIAGNOSIS — H4052X2 Glaucoma secondary to other eye disorders, left eye, moderate stage: Secondary | ICD-10-CM | POA: Diagnosis not present

## 2019-08-20 DIAGNOSIS — Z79899 Other long term (current) drug therapy: Secondary | ICD-10-CM | POA: Diagnosis not present

## 2019-08-20 DIAGNOSIS — H35372 Puckering of macula, left eye: Secondary | ICD-10-CM | POA: Diagnosis not present

## 2019-08-20 DIAGNOSIS — H209 Unspecified iridocyclitis: Secondary | ICD-10-CM | POA: Diagnosis not present

## 2019-08-20 DIAGNOSIS — Z5181 Encounter for therapeutic drug level monitoring: Secondary | ICD-10-CM | POA: Diagnosis not present

## 2019-08-20 DIAGNOSIS — H40021 Open angle with borderline findings, high risk, right eye: Secondary | ICD-10-CM | POA: Diagnosis not present

## 2019-08-20 DIAGNOSIS — C441191 Basal cell carcinoma of skin of left upper eyelid, including canthus: Secondary | ICD-10-CM | POA: Diagnosis not present

## 2019-08-26 DIAGNOSIS — H4052X2 Glaucoma secondary to other eye disorders, left eye, moderate stage: Secondary | ICD-10-CM | POA: Diagnosis not present

## 2019-10-17 DIAGNOSIS — I10 Essential (primary) hypertension: Secondary | ICD-10-CM | POA: Diagnosis not present

## 2019-10-17 DIAGNOSIS — M810 Age-related osteoporosis without current pathological fracture: Secondary | ICD-10-CM | POA: Diagnosis not present

## 2019-10-31 DIAGNOSIS — Z7982 Long term (current) use of aspirin: Secondary | ICD-10-CM | POA: Diagnosis not present

## 2019-10-31 DIAGNOSIS — E7800 Pure hypercholesterolemia, unspecified: Secondary | ICD-10-CM | POA: Diagnosis not present

## 2019-10-31 DIAGNOSIS — M810 Age-related osteoporosis without current pathological fracture: Secondary | ICD-10-CM | POA: Diagnosis not present

## 2019-11-05 DIAGNOSIS — J309 Allergic rhinitis, unspecified: Secondary | ICD-10-CM | POA: Diagnosis not present

## 2019-11-05 DIAGNOSIS — Z0000 Encounter for general adult medical examination without abnormal findings: Secondary | ICD-10-CM | POA: Diagnosis not present

## 2019-11-05 DIAGNOSIS — M810 Age-related osteoporosis without current pathological fracture: Secondary | ICD-10-CM | POA: Diagnosis not present

## 2019-11-05 DIAGNOSIS — H209 Unspecified iridocyclitis: Secondary | ICD-10-CM | POA: Diagnosis not present

## 2019-11-05 DIAGNOSIS — K5130 Ulcerative (chronic) rectosigmoiditis without complications: Secondary | ICD-10-CM | POA: Diagnosis not present

## 2019-11-05 DIAGNOSIS — G43909 Migraine, unspecified, not intractable, without status migrainosus: Secondary | ICD-10-CM | POA: Diagnosis not present

## 2019-11-05 DIAGNOSIS — I10 Essential (primary) hypertension: Secondary | ICD-10-CM | POA: Diagnosis not present

## 2019-11-05 DIAGNOSIS — E7800 Pure hypercholesterolemia, unspecified: Secondary | ICD-10-CM | POA: Diagnosis not present

## 2019-11-05 DIAGNOSIS — K219 Gastro-esophageal reflux disease without esophagitis: Secondary | ICD-10-CM | POA: Diagnosis not present

## 2019-11-05 DIAGNOSIS — M7918 Myalgia, other site: Secondary | ICD-10-CM | POA: Diagnosis not present

## 2019-11-12 DIAGNOSIS — H4052X2 Glaucoma secondary to other eye disorders, left eye, moderate stage: Secondary | ICD-10-CM | POA: Diagnosis not present

## 2019-11-12 DIAGNOSIS — Z79899 Other long term (current) drug therapy: Secondary | ICD-10-CM | POA: Diagnosis not present

## 2019-11-12 DIAGNOSIS — Z961 Presence of intraocular lens: Secondary | ICD-10-CM | POA: Diagnosis not present

## 2019-11-12 DIAGNOSIS — H209 Unspecified iridocyclitis: Secondary | ICD-10-CM | POA: Diagnosis not present

## 2019-11-12 DIAGNOSIS — Z85828 Personal history of other malignant neoplasm of skin: Secondary | ICD-10-CM | POA: Diagnosis not present

## 2019-11-12 DIAGNOSIS — H35372 Puckering of macula, left eye: Secondary | ICD-10-CM | POA: Diagnosis not present

## 2019-11-21 DIAGNOSIS — Z20828 Contact with and (suspected) exposure to other viral communicable diseases: Secondary | ICD-10-CM | POA: Diagnosis not present

## 2019-12-05 ENCOUNTER — Telehealth: Payer: Self-pay | Admitting: *Deleted

## 2019-12-05 NOTE — Telephone Encounter (Signed)
A message was left, re: her follow up visit. 

## 2019-12-10 DIAGNOSIS — H6123 Impacted cerumen, bilateral: Secondary | ICD-10-CM | POA: Diagnosis not present

## 2019-12-13 DIAGNOSIS — Z23 Encounter for immunization: Secondary | ICD-10-CM | POA: Diagnosis not present

## 2019-12-19 ENCOUNTER — Telehealth: Payer: Self-pay | Admitting: Cardiovascular Disease

## 2019-12-19 NOTE — Telephone Encounter (Signed)
lvm for patient to return call to get follow up scheduled with [facility] from recall list °

## 2020-01-17 ENCOUNTER — Other Ambulatory Visit: Payer: Self-pay | Admitting: Internal Medicine

## 2020-01-17 DIAGNOSIS — Z1231 Encounter for screening mammogram for malignant neoplasm of breast: Secondary | ICD-10-CM

## 2020-01-17 DIAGNOSIS — H4052X2 Glaucoma secondary to other eye disorders, left eye, moderate stage: Secondary | ICD-10-CM | POA: Diagnosis not present

## 2020-01-17 DIAGNOSIS — H40021 Open angle with borderline findings, high risk, right eye: Secondary | ICD-10-CM | POA: Diagnosis not present

## 2020-01-20 DIAGNOSIS — Z01419 Encounter for gynecological examination (general) (routine) without abnormal findings: Secondary | ICD-10-CM | POA: Diagnosis not present

## 2020-01-20 DIAGNOSIS — Z124 Encounter for screening for malignant neoplasm of cervix: Secondary | ICD-10-CM | POA: Diagnosis not present

## 2020-01-20 DIAGNOSIS — M810 Age-related osteoporosis without current pathological fracture: Secondary | ICD-10-CM | POA: Diagnosis not present

## 2020-02-11 DIAGNOSIS — H40021 Open angle with borderline findings, high risk, right eye: Secondary | ICD-10-CM | POA: Diagnosis not present

## 2020-02-11 DIAGNOSIS — Z79899 Other long term (current) drug therapy: Secondary | ICD-10-CM | POA: Diagnosis not present

## 2020-02-11 DIAGNOSIS — H4052X2 Glaucoma secondary to other eye disorders, left eye, moderate stage: Secondary | ICD-10-CM | POA: Diagnosis not present

## 2020-02-11 DIAGNOSIS — Z85828 Personal history of other malignant neoplasm of skin: Secondary | ICD-10-CM | POA: Diagnosis not present

## 2020-02-11 DIAGNOSIS — H35372 Puckering of macula, left eye: Secondary | ICD-10-CM | POA: Diagnosis not present

## 2020-02-11 DIAGNOSIS — Z961 Presence of intraocular lens: Secondary | ICD-10-CM | POA: Diagnosis not present

## 2020-02-11 DIAGNOSIS — H209 Unspecified iridocyclitis: Secondary | ICD-10-CM | POA: Diagnosis not present

## 2020-02-17 DIAGNOSIS — H40021 Open angle with borderline findings, high risk, right eye: Secondary | ICD-10-CM | POA: Diagnosis not present

## 2020-02-17 DIAGNOSIS — H4052X2 Glaucoma secondary to other eye disorders, left eye, moderate stage: Secondary | ICD-10-CM | POA: Diagnosis not present

## 2020-03-02 ENCOUNTER — Ambulatory Visit
Admission: RE | Admit: 2020-03-02 | Discharge: 2020-03-02 | Disposition: A | Discharge status: Home / Self Care | Payer: Medicare Other | Source: Ambulatory Visit | Attending: Internal Medicine | Admitting: Internal Medicine

## 2020-03-02 ENCOUNTER — Other Ambulatory Visit: Payer: Self-pay

## 2020-03-02 DIAGNOSIS — Z1231 Encounter for screening mammogram for malignant neoplasm of breast: Secondary | ICD-10-CM | POA: Diagnosis not present

## 2020-03-20 DIAGNOSIS — Z20822 Contact with and (suspected) exposure to covid-19: Secondary | ICD-10-CM | POA: Diagnosis not present

## 2020-03-25 ENCOUNTER — Ambulatory Visit: Payer: Medicare Other | Admitting: Cardiovascular Disease

## 2020-04-14 DIAGNOSIS — Z79899 Other long term (current) drug therapy: Secondary | ICD-10-CM | POA: Diagnosis not present

## 2020-04-14 DIAGNOSIS — H4052X2 Glaucoma secondary to other eye disorders, left eye, moderate stage: Secondary | ICD-10-CM | POA: Diagnosis not present

## 2020-04-14 DIAGNOSIS — Z85828 Personal history of other malignant neoplasm of skin: Secondary | ICD-10-CM | POA: Diagnosis not present

## 2020-04-14 DIAGNOSIS — H35372 Puckering of macula, left eye: Secondary | ICD-10-CM | POA: Diagnosis not present

## 2020-04-14 DIAGNOSIS — Z961 Presence of intraocular lens: Secondary | ICD-10-CM | POA: Diagnosis not present

## 2020-04-14 DIAGNOSIS — H209 Unspecified iridocyclitis: Secondary | ICD-10-CM | POA: Diagnosis not present

## 2020-05-13 ENCOUNTER — Ambulatory Visit (INDEPENDENT_AMBULATORY_CARE_PROVIDER_SITE_OTHER): Payer: Medicare Other | Admitting: Cardiovascular Disease

## 2020-05-13 ENCOUNTER — Other Ambulatory Visit: Payer: Self-pay

## 2020-05-13 ENCOUNTER — Encounter: Payer: Self-pay | Admitting: Cardiovascular Disease

## 2020-05-13 VITALS — BP 132/82 | HR 54 | Ht 62.5 in | Wt 151.8 lb

## 2020-05-13 DIAGNOSIS — E782 Mixed hyperlipidemia: Secondary | ICD-10-CM | POA: Diagnosis not present

## 2020-05-13 DIAGNOSIS — I10 Essential (primary) hypertension: Secondary | ICD-10-CM

## 2020-05-13 DIAGNOSIS — Z8249 Family history of ischemic heart disease and other diseases of the circulatory system: Secondary | ICD-10-CM | POA: Diagnosis not present

## 2020-05-13 NOTE — Assessment & Plan Note (Signed)
History of essential hypertension with blood pressure measured at 148/88.  She is on amlodipine and Maxide.

## 2020-05-13 NOTE — Progress Notes (Signed)
05/13/2020 [name]   [birth year]  [id number]  Primary Physician Deland Pretty, MD Primary Cardiologist: Lorretta Harp MD [name], [place]  HPI:  [name] is a 80 y.o.  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 She was referred by Dr. Dr. Deland Pretty at [facility] evaluation of generalized fatigue and an abnormal EKG.I last saw her in the office  12/14/2018. Her cardiovascular risk factor profile is remarkable for hypertension, hyperlipidemia and family history. Her father had heart-related issues in the 47s and a brother did have a stent before he died of lung cancer in his 65s. She has never had a heart attack or stroke. She does complain of dyspnea but denies chest pain. She was referred for an abnormal EKG with ST segment changes. She had a subsequent 2D echocardiogram after her office visit performed 08/14/2013 that was essentially normal as was an echo 11/07/2017. Since I saw her 4 years ago she is been referred back for 6 episodes of presyncope which began several months ago. She does feel palpitations during these and get some shortness of breath as well as weakness after they subside. Holter monitor showed sinus rhythm consult sinus sinus bradycardia with occasional PVCs and occasional short atrial runs which do not appear to be contributory.   Since I saw her in the office a year ago she is remained stable.    She was seen in the ER a year ago for vertigo.  Since that time she said no recurrent symptoms.  She denies chest pain or shortness of breath. Active Medications      Current Meds  Medication Sig  . acetaminophen (TYLENOL) 500 MG tablet Take 1,000 mg by mouth every 6 (six) hours as needed for headache.  Marland Kitchen amLODipine (NORVASC) 5 MG tablet TAKE 1 TABLET EACH DAY.  Marland Kitchen atorvastatin  (LIPITOR) 20 MG tablet Take 20 mg by mouth at bedtime.   Marland Kitchen CALCIUM CARBONATE-VITAMIN D PO Take 1,200 Units by mouth daily.  . cholecalciferol (VITAMIN D) 1000 UNITS tablet Take 1,000 Units by mouth daily.  . dorzolamide-timolol (COSOPT) 22.3-6.8 MG/ML ophthalmic solution Place 1 drop into the left eye 2 (two) times daily.  . folic acid (FOLVITE) 1 MG tablet Take 3 mg by mouth daily.  . meclizine (ANTIVERT) 25 MG tablet Take 1 tablet (25 mg total) by mouth 2 (two) times daily.  . Methotrexate 2.5 MG/ML SOLN Take 12.5 mg/mL by mouth. Use as directed  . Multiple Vitamins-Minerals (ICAPS MV PO) Take 1 tablet by mouth 2 (two) times daily.  Vladimir Faster Glycol-Propyl Glycol 0.4-0.3 % SOLN Place 1 drop into the right eye as needed (For dry eyes.).   Marland Kitchen prednisoLONE acetate (PRED FORTE) 1 % ophthalmic suspension Place 1 drop into the left eye 3 (three) times daily.  . rizatriptan (MAXALT-MLT) 10 MG disintegrating tablet Take 1 tablet by mouth daily as needed for migraine.   . triamterene-hydrochlorothiazide (MAXZIDE-25) 37.5-25 MG per tablet Take 0.5 tablets by mouth as needed (For swelling.).   Marland Kitchen valACYclovir (VALTREX) 1000 MG tablet Take 1,000 mg by mouth daily.     Allergies  Allergen Reactions  . Abreva [Docosanol] Swelling and Other (See Comments)    Top lip and side of face swollen  . Codeine Nausea Only    Social History   Socioeconomic History  .  Marital status: Widowed    Spouse name: Not on file  . Number of children: Not on file  . Years of education: Not on file  . Highest education level: Not on file  Occupational History  . Not on file  Tobacco Use  . Smoking status: Never Smoker  . Smokeless tobacco: Never Used  Substance and Sexual Activity  . Alcohol use: No  . Drug use: No  . Sexual activity: Not on file  Other Topics Concern  . Not on file  Social History Narrative  . Not on file   Social Determinants of Health   Financial Resource Strain: Not on file  Food  Insecurity: Not on file  Transportation Needs: Not on file  Physical Activity: Not on file  Stress: Not on file  Social Connections: Not on file  Intimate Partner Violence: Not on file     Review of Systems: General: negative for chills, fever, night sweats or weight changes.  Cardiovascular: negative for chest pain, dyspnea on exertion, edema, orthopnea, palpitations, paroxysmal nocturnal dyspnea or shortness of breath Dermatological: negative for rash Respiratory: negative for cough or wheezing Urologic: negative for hematuria Abdominal: negative for nausea, vomiting, diarrhea, bright red blood per rectum, melena, or hematemesis Neurologic: negative for visual changes, syncope, or dizziness All other systems reviewed and are otherwise negative except as noted above.    Blood pressure (!) 148/88, pulse (!) 54, height 5' 2.5" (1.588 m), weight 151 lb 12.8 oz (68.9 kg), SpO2 97 %.  General appearance: alert and no distress Neck: no adenopathy, no carotid bruit, no JVD, supple, symmetrical, trachea midline and thyroid not enlarged, symmetric, no tenderness/mass/nodules Lungs: clear to auscultation bilaterally Heart: regular rate and rhythm, S1, S2 normal, no murmur, click, rub or gallop Extremities: extremities normal, atraumatic, no cyanosis or edema Pulses: 2+ and symmetric Skin: Skin color, texture, turgor normal. No rashes or lesions Neurologic: Alert and oriented X 3, normal strength and tone. Normal symmetric reflexes. Normal coordination and gait  EKG sinus bradycardia 54 with nonspecific ST and T wave changes unchanged from prior EKGs.  I personally reviewed this EKG.  ASSESSMENT AND PLAN:   Essential hypertension History of essential hypertension with blood pressure measured at 148/88.  She is on amlodipine and Maxide.  Hyperlipidemia History of hyperlipidemia on statin therapy with lipid profile performed 10/31/2019 revealing LDL of 70, HDL of 63.      Lorretta Harp MD FACP,FACC,FAHA, [facility] 05/13/2020 11:20 AM

## 2020-05-13 NOTE — Patient Instructions (Signed)

## 2020-05-13 NOTE — Assessment & Plan Note (Signed)
History of hyperlipidemia on statin therapy with lipid profile performed 10/31/2019 revealing LDL of 70, HDL of 63.

## 2020-05-26 DIAGNOSIS — H40021 Open angle with borderline findings, high risk, right eye: Secondary | ICD-10-CM | POA: Diagnosis not present

## 2020-05-26 DIAGNOSIS — Z961 Presence of intraocular lens: Secondary | ICD-10-CM | POA: Diagnosis not present

## 2020-05-26 DIAGNOSIS — Z79899 Other long term (current) drug therapy: Secondary | ICD-10-CM | POA: Diagnosis not present

## 2020-05-26 DIAGNOSIS — H35372 Puckering of macula, left eye: Secondary | ICD-10-CM | POA: Diagnosis not present

## 2020-05-26 DIAGNOSIS — H4052X2 Glaucoma secondary to other eye disorders, left eye, moderate stage: Secondary | ICD-10-CM | POA: Diagnosis not present

## 2020-05-26 DIAGNOSIS — Z85828 Personal history of other malignant neoplasm of skin: Secondary | ICD-10-CM | POA: Diagnosis not present

## 2020-05-26 DIAGNOSIS — H209 Unspecified iridocyclitis: Secondary | ICD-10-CM | POA: Diagnosis not present

## 2020-06-22 DIAGNOSIS — H40021 Open angle with borderline findings, high risk, right eye: Secondary | ICD-10-CM | POA: Diagnosis not present

## 2020-06-22 DIAGNOSIS — H4052X2 Glaucoma secondary to other eye disorders, left eye, moderate stage: Secondary | ICD-10-CM | POA: Diagnosis not present

## 2020-08-14 DIAGNOSIS — L239 Allergic contact dermatitis, unspecified cause: Secondary | ICD-10-CM | POA: Diagnosis not present

## 2020-08-25 DIAGNOSIS — R39198 Other difficulties with micturition: Secondary | ICD-10-CM | POA: Diagnosis not present

## 2020-08-25 DIAGNOSIS — R3915 Urgency of urination: Secondary | ICD-10-CM | POA: Diagnosis not present

## 2020-08-25 DIAGNOSIS — R1030 Lower abdominal pain, unspecified: Secondary | ICD-10-CM | POA: Diagnosis not present

## 2020-08-25 DIAGNOSIS — K5900 Constipation, unspecified: Secondary | ICD-10-CM | POA: Diagnosis not present

## 2020-08-27 ENCOUNTER — Other Ambulatory Visit: Payer: Self-pay

## 2020-08-27 ENCOUNTER — Other Ambulatory Visit: Payer: Self-pay | Admitting: Gastroenterology

## 2020-08-27 ENCOUNTER — Ambulatory Visit
Admission: RE | Admit: 2020-08-27 | Discharge: 2020-08-27 | Disposition: A | Discharge status: Home / Self Care | Payer: Medicare Other | Source: Ambulatory Visit | Attending: Gastroenterology | Admitting: Gastroenterology

## 2020-08-27 ENCOUNTER — Encounter (HOSPITAL_COMMUNITY): Payer: Self-pay

## 2020-08-27 ENCOUNTER — Inpatient Hospital Stay (HOSPITAL_COMMUNITY)
Admission: EM | Admit: 2020-08-27 | Discharge: 2020-08-30 | DRG: 392 | Disposition: A | Discharge status: Home / Self Care | Payer: Medicare Other | Attending: Internal Medicine | Admitting: Internal Medicine

## 2020-08-27 DIAGNOSIS — R1084 Generalized abdominal pain: Secondary | ICD-10-CM

## 2020-08-27 DIAGNOSIS — Z20822 Contact with and (suspected) exposure to covid-19: Secondary | ICD-10-CM | POA: Diagnosis present

## 2020-08-27 DIAGNOSIS — Z885 Allergy status to narcotic agent status: Secondary | ICD-10-CM

## 2020-08-27 DIAGNOSIS — Z79899 Other long term (current) drug therapy: Secondary | ICD-10-CM | POA: Diagnosis not present

## 2020-08-27 DIAGNOSIS — E785 Hyperlipidemia, unspecified: Secondary | ICD-10-CM | POA: Diagnosis present

## 2020-08-27 DIAGNOSIS — Z888 Allergy status to other drugs, medicaments and biological substances status: Secondary | ICD-10-CM | POA: Diagnosis not present

## 2020-08-27 DIAGNOSIS — K5900 Constipation, unspecified: Secondary | ICD-10-CM | POA: Diagnosis not present

## 2020-08-27 DIAGNOSIS — M5136 Other intervertebral disc degeneration, lumbar region: Secondary | ICD-10-CM | POA: Diagnosis not present

## 2020-08-27 DIAGNOSIS — M810 Age-related osteoporosis without current pathological fracture: Secondary | ICD-10-CM | POA: Diagnosis present

## 2020-08-27 DIAGNOSIS — Z9049 Acquired absence of other specified parts of digestive tract: Secondary | ICD-10-CM | POA: Diagnosis not present

## 2020-08-27 DIAGNOSIS — R195 Other fecal abnormalities: Secondary | ICD-10-CM | POA: Diagnosis not present

## 2020-08-27 DIAGNOSIS — M1990 Unspecified osteoarthritis, unspecified site: Secondary | ICD-10-CM | POA: Diagnosis present

## 2020-08-27 DIAGNOSIS — I10 Essential (primary) hypertension: Secondary | ICD-10-CM | POA: Diagnosis present

## 2020-08-27 DIAGNOSIS — K5720 Diverticulitis of large intestine with perforation and abscess without bleeding: Principal | ICD-10-CM | POA: Diagnosis present

## 2020-08-27 DIAGNOSIS — K6389 Other specified diseases of intestine: Secondary | ICD-10-CM | POA: Diagnosis not present

## 2020-08-27 DIAGNOSIS — K5732 Diverticulitis of large intestine without perforation or abscess without bleeding: Secondary | ICD-10-CM | POA: Diagnosis not present

## 2020-08-27 DIAGNOSIS — K5790 Diverticulosis of intestine, part unspecified, without perforation or abscess without bleeding: Secondary | ICD-10-CM | POA: Diagnosis not present

## 2020-08-27 DIAGNOSIS — Z8249 Family history of ischemic heart disease and other diseases of the circulatory system: Secondary | ICD-10-CM | POA: Diagnosis not present

## 2020-08-27 HISTORY — DX: Diverticulitis of intestine, part unspecified, without perforation or abscess without bleeding: K57.92

## 2020-08-27 LAB — COMPREHENSIVE METABOLIC PANEL
ALT: 27 U/L (ref 0–44)
AST: 20 U/L (ref 15–41)
Albumin: 3.8 g/dL (ref 3.5–5.0)
Alkaline Phosphatase: 60 U/L (ref 38–126)
Anion gap: 8 (ref 5–15)
BUN: 8 mg/dL (ref 8–23)
CO2: 26 mmol/L (ref 22–32)
Calcium: 9.1 mg/dL (ref 8.9–10.3)
Chloride: 102 mmol/L (ref 98–111)
Creatinine, Ser: 0.99 mg/dL (ref 0.44–1.00)
GFR, Estimated: 58 mL/min — ABNORMAL LOW (ref >60)
Glucose, Bld: 102 mg/dL — ABNORMAL HIGH (ref 70–99)
Potassium: 3.7 mmol/L (ref 3.5–5.1)
Sodium: 136 mmol/L (ref 135–145)
Total Bilirubin: 1 mg/dL (ref 0.3–1.2)
Total Protein: 6.9 g/dL (ref 6.5–8.1)

## 2020-08-27 LAB — CBC WITH DIFFERENTIAL/PLATELET
Abs Immature Granulocytes: 0.03 10*3/uL (ref 0.00–0.07)
Basophils Absolute: 0 10*3/uL (ref 0.0–0.1)
Basophils Relative: 0 %
Eosinophils Absolute: 0.3 10*3/uL (ref 0.0–0.5)
Eosinophils Relative: 3 %
HCT: 40.8 % (ref 36.0–46.0)
Hemoglobin: 13.9 g/dL (ref 12.0–15.0)
Immature Granulocytes: 0 %
Lymphocytes Relative: 11 %
Lymphs Abs: 1.1 10*3/uL (ref 0.7–4.0)
MCH: 34.9 pg — ABNORMAL HIGH (ref 26.0–34.0)
MCHC: 34.1 g/dL (ref 30.0–36.0)
MCV: 102.5 fL — ABNORMAL HIGH (ref 80.0–100.0)
Monocytes Absolute: 0.9 10*3/uL (ref 0.1–1.0)
Monocytes Relative: 9 %
Neutro Abs: 7.5 10*3/uL (ref 1.7–7.7)
Neutrophils Relative %: 77 %
Platelets: 183 10*3/uL (ref 150–400)
RBC: 3.98 MIL/uL (ref 3.87–5.11)
RDW: 14.3 % (ref 11.5–15.5)
WBC: 9.8 10*3/uL (ref 4.0–10.5)
nRBC: 0 % (ref 0.0–0.2)

## 2020-08-27 MED ORDER — RIZATRIPTAN BENZOATE 10 MG PO TBDP: 10.0000 mg | ORAL_TABLET | Freq: Every day | ORAL | Status: DC | PRN

## 2020-08-27 MED ORDER — METRONIDAZOLE 500 MG/100ML IV SOLN
500.0000 mg | Freq: Once | INTRAVENOUS | Status: AC
  Administered 2020-08-27: 500 mg via INTRAVENOUS
  Filled 2020-08-27: qty 100

## 2020-08-27 MED ORDER — ACETAMINOPHEN 500 MG PO TABS: 500.0000 mg | ORAL_TABLET | Freq: Four times a day (QID) | ORAL | Status: DC | PRN

## 2020-08-27 MED ORDER — SODIUM CHLORIDE 0.9 % IV SOLN
2.0000 g | Freq: Once | INTRAVENOUS | Status: AC
  Administered 2020-08-27: 2 g via INTRAVENOUS
  Filled 2020-08-27: qty 20

## 2020-08-27 MED ORDER — IOPAMIDOL (ISOVUE-300) INJECTION 61%
100.0000 mL | Freq: Once | INTRAVENOUS | Status: AC | PRN
  Administered 2020-08-27: 100 mL via INTRAVENOUS

## 2020-08-27 MED ORDER — SODIUM CHLORIDE 0.9 % IV SOLN: INTRAVENOUS | Status: DC

## 2020-08-27 MED ORDER — VALACYCLOVIR HCL 500 MG PO TABS
1000.0000 mg | ORAL_TABLET | Freq: Every day | ORAL | Status: DC
  Administered 2020-08-28 – 2020-08-30 (×3): 1000 mg via ORAL
  Filled 2020-08-27 (×4): qty 2

## 2020-08-27 MED ORDER — MORPHINE SULFATE (PF) 2 MG/ML IV SOLN: 2.0000 mg | INTRAVENOUS | Status: DC | PRN

## 2020-08-27 MED ORDER — FOLIC ACID 1 MG PO TABS
3.0000 mg | ORAL_TABLET | Freq: Every day | ORAL | Status: DC
  Administered 2020-08-28 – 2020-08-29 (×3): 3 mg via ORAL
  Filled 2020-08-27 (×4): qty 3

## 2020-08-27 MED ORDER — SODIUM CHLORIDE 0.9 % IV BOLUS
500.0000 mL | Freq: Once | INTRAVENOUS | Status: AC
  Administered 2020-08-27: 500 mL via INTRAVENOUS

## 2020-08-27 MED ORDER — ATORVASTATIN CALCIUM 10 MG PO TABS
20.0000 mg | ORAL_TABLET | Freq: Every day | ORAL | Status: DC
  Administered 2020-08-27 – 2020-08-29 (×3): 20 mg via ORAL
  Filled 2020-08-27 (×3): qty 2

## 2020-08-27 MED ORDER — IOPAMIDOL (ISOVUE-M 300) INJECTION 61%: 15.0000 mL | Freq: Once | INTRAMUSCULAR | Status: DC | PRN

## 2020-08-27 MED ORDER — PIPERACILLIN-TAZOBACTAM 3.375 G IVPB 30 MIN
3.3750 g | Freq: Once | INTRAVENOUS | Status: AC
  Administered 2020-08-27: 3.375 g via INTRAVENOUS
  Filled 2020-08-27: qty 50

## 2020-08-27 MED ORDER — HYDRALAZINE HCL 20 MG/ML IJ SOLN: 10.0000 mg | Freq: Four times a day (QID) | INTRAMUSCULAR | Status: DC | PRN

## 2020-08-27 MED ORDER — SUMATRIPTAN SUCCINATE 50 MG PO TABS
100.0000 mg | ORAL_TABLET | Freq: Every day | ORAL | Status: DC | PRN
  Filled 2020-08-27: qty 2

## 2020-08-27 MED ORDER — PIPERACILLIN-TAZOBACTAM 3.375 G IVPB
3.3750 g | Freq: Three times a day (TID) | INTRAVENOUS | Status: DC
  Administered 2020-08-27 – 2020-08-30 (×7): 3.375 g via INTRAVENOUS
  Filled 2020-08-27 (×10): qty 50

## 2020-08-27 MED ORDER — DORZOLAMIDE HCL-TIMOLOL MAL 2-0.5 % OP SOLN
1.0000 [drp] | Freq: Two times a day (BID) | OPHTHALMIC | Status: DC
  Administered 2020-08-27 – 2020-08-30 (×6): 1 [drp] via OPHTHALMIC
  Filled 2020-08-27: qty 10

## 2020-08-27 MED ORDER — ONDANSETRON HCL 4 MG/2ML IJ SOLN: 4.0000 mg | Freq: Four times a day (QID) | INTRAMUSCULAR | Status: DC | PRN

## 2020-08-27 NOTE — H&P (Addendum)
History and Physical    [name] ZTI:[id number] DOB: 09/03/1940 DOA: 08/27/2020  PCP: Deland Pretty, MD   Patient coming from: GI office    Chief Complaint: Abdominal pain  HPI: [name] is a 80 y.o. female with medical history significant of hypertension, hyperlipidemia, diverticulosis, retinitis who presented to the emergency department after she was referred from her gastroenterologist office .  Patient was having abdominal pain since last few days.  Pain was mainly in the left lower quadrant that was radiating to the mid abdomen.  She was unable to walk or sit and the pain was excruciating and she was unable to defecate or pass urine.  Patient also reported mild grade fever, nausea. Patient has history of diverticulosis, last colonoscopy was in November , 2020.  She has history of hemicolectomy done in 2010 for recurrent polyp. Patient seen and examined the bedside in the emergency department.  During my evaluation, she was hemodynamically stable, overall comfortable.  She denied any vomiting, dysuria, chest pain, palpitations, cough, chest pain, hematochezia or melena.  ED Course: Hemodynamically stable on presentation, afebrile.  Lab work was mostly stable..  CT abdomen/pelvis showed acute diverticulitis at the rectosigmoid junction with possible developing intramural abscess.  We were consulted for admission.  General surgery also consulted.  Review of Systems: As per HPI otherwise 10 point review of systems negative.    Past Medical History:  Diagnosis Date   Abnormal EKG    LVH with repolarization changes   Arthritis    Diverticulitis    Headache    migraines    Hyperlipidemia    Hyperlipidemia    Visual disorder    Torn retina    Past Surgical History:  Procedure Laterality Date   APPENDECTOMY     COLON SURGERY     DILATATION & CURETTAGE/HYSTEROSCOPY WITH MYOSURE N/A 05/11/2015   Procedure: DILATATION & CURETTAGE/HYSTEROSCOPY WITH MYOSURE POLYPECTOMY;   Surgeon: Servando Salina, MD;  Location: [facility];  Service: Gynecology;  Laterality: N/A;   EYE SURGERY     SHOULDER ARTHROSCOPY     TONSILLECTOMY       reports that she has never smoked. She has never used smokeless tobacco. She reports that she does not drink alcohol and does not use drugs.  Allergies  Allergen Reactions   Abreva [Docosanol] Swelling and Other (See Comments)    Top lip and side of face swollen   Codeine Nausea Only    Family History  Problem Relation Age of Onset   Stroke Mother 104   Cancer Mother 58       Breast cancer   Hypertension Mother    Breast cancer Mother    Heart disease Father    Hypertension Father    Stroke Father 23   Heart disease Brother 31   Cancer Brother        Lung cancer   Cancer Maternal Grandmother        Breast cancer   Breast cancer Maternal Grandmother    Kidney disease Maternal Grandfather    Diabetes Paternal Grandmother    Hyperlipidemia Child    Breast cancer Maternal Aunt      Prior to Admission medications   Medication Sig Start Date End Date Taking? Authorizing Provider  acetaminophen (TYLENOL) 500 MG tablet Take 1,000 mg by mouth every 6 (six) hours as needed for headache.    [provider]  amLODipine (NORVASC) 5 MG tablet TAKE 1 TABLET EACH DAY. 04/18/19   Quay Burow  J, MD  atorvastatin (LIPITOR) 20 MG tablet Take 20 mg by mouth at bedtime.     [provider]  CALCIUM CARBONATE-VITAMIN D PO Take 1,200 Units by mouth daily.    [provider]  cholecalciferol (VITAMIN D) 1000 UNITS tablet Take 1,000 Units by mouth daily.    [provider]  dorzolamide-timolol (COSOPT) 22.3-6.8 MG/ML ophthalmic solution Place 1 drop into the left eye 2 (two) times daily.    [provider]  folic acid (FOLVITE) 1 MG tablet Take 3 mg by mouth daily. 05/29/18   [provider]  meclizine (ANTIVERT) 25 MG tablet Take 1 tablet (25 mg total) by mouth 2 (two) times daily. 08/17/18    Montine Circle, PA-C  Methotrexate 2.5 MG/ML SOLN Take 12.5 mg/mL by mouth. Use as directed    [provider]  Multiple Vitamins-Minerals (ICAPS MV PO) Take 1 tablet by mouth 2 (two) times daily.    [provider]  Polyethyl Glycol-Propyl Glycol 0.4-0.3 % SOLN Place 1 drop into the right eye as needed (For dry eyes.).     [provider]  prednisoLONE acetate (PRED FORTE) 1 % ophthalmic suspension Place 1 drop into the left eye 3 (three) times daily.    [provider]  rizatriptan (MAXALT-MLT) 10 MG disintegrating tablet Take 1 tablet by mouth daily as needed for migraine.  07/08/13   [provider]  triamterene-hydrochlorothiazide (MAXZIDE-25) 37.5-25 MG per tablet Take 0.5 tablets by mouth as needed (For swelling.).  07/01/13   [provider]  valACYclovir (VALTREX) 1000 MG tablet Take 1,000 mg by mouth daily. 05/29/18   [provider]    Physical Exam: Vitals:   08/27/20 1450 08/27/20 1453 08/27/20 1615  BP: (!) 149/84  113/80  Pulse: 69  80  Resp: 16  18  Temp: 98.4 F (36.9 C)    TempSrc: Oral    SpO2: 99%  95%  Weight:  67.6 kg   Height:  5' 2.5" (1.588 m)     Constitutional: NAD, calm, comfortable Vitals:   08/27/20 1450 08/27/20 1453 08/27/20 1615  BP: (!) 149/84  113/80  Pulse: 69  80  Resp: 16  18  Temp: 98.4 F (36.9 C)    TempSrc: Oral    SpO2: 99%  95%  Weight:  67.6 kg   Height:  5' 2.5" (1.588 m)    Eyes: PERRL, lids and conjunctivae normal ENMT: Mucous membranes are moist.  Neck: normal, supple, no masses, no thyromegaly Respiratory: clear to auscultation bilaterally, no wheezing, no crackles. Normal respiratory effort. No accessory muscle use.  Cardiovascular: Regular rate and rhythm, no murmurs / rubs / gallops. No extremity edema.  Abdomen: LLQ tenderness, no masses palpated. No hepatosplenomegaly. Bowel sounds positive.  Musculoskeletal: no clubbing / cyanosis. No joint deformity upper  and lower extremities.  Skin: no rashes, lesions, ulcers. No induration Neurologic: CN 2-12 grossly intact.  Strength 5/5 in all 4.  Psychiatric: Normal judgment and insight. Alert and oriented x 3. Normal mood.   Foley Catheter:None  Labs on Admission: I have personally reviewed following labs and imaging studies  CBC: Recent Labs  Lab 08/27/20 1507  WBC 9.8  NEUTROABS 7.5  HGB 13.9  HCT 40.8  MCV 102.5*  PLT 740   Basic Metabolic Panel: Recent Labs  Lab 08/27/20 1507  NA 136  K 3.7  CL 102  CO2 26  GLUCOSE 102*  BUN 8  CREATININE 0.99  CALCIUM 9.1   GFR:  Estimated Creatinine Clearance: 42.7 mL/min (by C-G formula based on SCr of 0.99 mg/dL). Liver Function Tests: Recent Labs  Lab 08/27/20 1507  AST 20  ALT 27  ALKPHOS 60  BILITOT 1.0  PROT 6.9  ALBUMIN 3.8   No results for input(s): LIPASE, AMYLASE in the last 168 hours. No results for input(s): AMMONIA in the last 168 hours. Coagulation Profile: No results for input(s): INR, PROTIME in the last 168 hours. Cardiac Enzymes: No results for input(s): CKTOTAL, CKMB, CKMBINDEX, TROPONINI in the last 168 hours. BNP (last 3 results) No results for input(s): PROBNP in the last 8760 hours. HbA1C: No results for input(s): HGBA1C in the last 72 hours. CBG: No results for input(s): GLUCAP in the last 168 hours. Lipid Profile: No results for input(s): CHOL, HDL, LDLCALC, TRIG, CHOLHDL, LDLDIRECT in the last 72 hours. Thyroid Function Tests: No results for input(s): TSH, T4TOTAL, FREET4, T3FREE, THYROIDAB in the last 72 hours. Anemia Panel: No results for input(s): VITAMINB12, FOLATE, FERRITIN, TIBC, IRON, RETICCTPCT in the last 72 hours. Urine analysis:    Component Value Date/Time   COLORURINE YELLOW 08/17/2018 0017   APPEARANCEUR CLEAR 08/17/2018 0017   LABSPEC 1.006 08/17/2018 0017   PHURINE 7.0 08/17/2018 0017   GLUCOSEU NEGATIVE 08/17/2018 0017   HGBUR NEGATIVE 08/17/2018 0017   BILIRUBINUR NEGATIVE  08/17/2018 0017   KETONESUR 5 (A) 08/17/2018 0017   PROTEINUR NEGATIVE 08/17/2018 0017   UROBILINOGEN 0.2 10/28/2008 1055   NITRITE NEGATIVE 08/17/2018 0017   LEUKOCYTESUR NEGATIVE 08/17/2018 0017    Radiological Exams on Admission: CT ABDOMEN PELVIS W CONTRAST  Result Date: 08/27/2020 CLINICAL DATA:  Generalized abdominal pain. EXAM: CT ABDOMEN AND PELVIS WITH CONTRAST TECHNIQUE: Multidetector CT imaging of the abdomen and pelvis was performed using the standard protocol following bolus administration of intravenous contrast. CONTRAST:  167mL ISOVUE-300 IOPAMIDOL (ISOVUE-300) INJECTION 61% COMPARISON:  None. FINDINGS: Lower chest: Mild bibasilar atelectasis. Lung bases are otherwise clear. Heart size within normal limits. Hepatobiliary: No focal liver abnormality is seen. No gallstones, gallbladder wall thickening, or biliary dilatation. Pancreas: Unremarkable. No pancreatic ductal dilatation or surrounding inflammatory changes. Spleen: Normal in size without focal abnormality. Adrenals/Urinary Tract: Unremarkable adrenal glands. Probable bilateral extrarenal pelvises or possibly mild bilateral hydronephrosis. No renal or ureteral stone identified. Kidneys enhance symmetrically. No focal lesion. Urinary bladder is within normal limits. Stomach/Bowel: Oral contrast is present throughout the bowel. Stomach is within normal limits. No dilated loops of bowel. Short segment of markedly thickened, inflamed bowel at the rectosigmoid junction containing numerous diverticula. Prominent pericolonic fat stranding and trace fluid. Possible developing intramural abscess along the colonic wall at this segment measuring approximately 2.4 x 1.1 cm (series 2, image 64). Otherwise, no organized fluid collection or abscess. No extraluminal air. Numerous diverticula throughout the remaining sigmoid colon. Vascular/Lymphatic: Scattered aortoiliac atherosclerotic calcifications without aneurysm. Circumaortic left renal vein,  an anatomic variant. No abdominopelvic lymphadenopathy. Reproductive: Uterus and bilateral adnexa are unremarkable. Other: No pneumoperitoneum.  No abdominal wall hernia. Musculoskeletal: No acute or significant osseous findings. Degenerative disc disease within the lumbar spine. IMPRESSION: 1. Acute diverticulitis at the rectosigmoid junction with possible developing intramural abscess. 2. Probable bilateral extrarenal pelvises or possibly mild bilateral hydronephrosis. 3. Aortic atherosclerosis (ICD10-I70.0). These results will be called to the ordering clinician or representative by the Radiologist Assistant, and communication documented in the PACS or [employer]. Electronically Signed   By: Davina Poke D.O.   On: 08/27/2020 13:15     Assessment/Plan Principal Problem:  Colonic diverticular abscess Active Problems:   Essential hypertension   Hyperlipidemia   Acute diverticulitis with possible developing intramural abscess: Presented with left lower quadrant pain, nausea, acute fever.  Afebrile on presentation, no leukocytosis.  We will keep her n.p.o.  Continue Zosyn for now.  Continue IV fluids, antiemetics, pain medications.  General surgery also consulted. Patient has history of diverticulosis and follows with [facility].  Hypertension: Current blood pressure stable.  She takes amlodipine, Maxide at home.  Antihypertensives on hold.  Continue pain medication for severe hypertension  Hyperlipidemia: Takes Lipitor at home, continued  History of retinitis: Follows with ophthalmology and takes methotrexate every week along with folic acid.  History of osteoarthritis/osteoporosis: Takes calcium and vitamin D supplements at home.  Severity of Illness: The appropriate patient status for this patient is INPATIENT.  Patient needs IV antibiotics, IV fluids and is unable to tolerate p.o. due to severity of illness  DVT prophylaxis: SCD Code Status: Full Family Communication: None at  the bedside.  I offered to call the family member, she says she has been communicating with them Consults called: General surgery, GI called by ED     Shelly Coss MD [facility]  08/27/2020, 5:00 PM

## 2020-08-27 NOTE — ED Provider Notes (Signed)
[place] DEPT Provider Note   CSN: [id number] Arrival date & time: 08/27/20  1443     History Chief Complaint  Patient presents with   Abdominal Pain    [name] is a 80 y.o. female with past medical history of HTN, HLD, and diverticulitis who presents the ED sent from gastroenterology for complicated diverticulitis.  I reviewed patient's CT abdomen and pelvis obtained earlier today in outpatient setting which demonstrated acute diverticulitis at the rectosigmoid junction with possible developing intramural abscess.  Patient is followed by [facility], Dr. Shelia Media, for primary care.  Her gastroenterologist is Dr. Cristina Gong with [facility] who prompted her to come to the ED for hospitalist admission for IV antibiotics.  On my examination, patient is resting comfortably.  She states that her pain has largely improved.  She reports 3-day history of left lower quadrant abdominal pain and difficulty voiding urine or stool.  She also endorsed diminished appetite and headache symptoms.  She reports that after returning home from CT earlier today she had a large loose bowel movement in her garage which has improved her symptoms.  She feels mildly nauseated, but thinks that she would be able to eat or drink.  She suspect that she is likely dehydrated given diminished p.o. intake.  She has had mild low-grade fevers and chills at home.  She denies any other symptoms or complaints.  HPI     Past Medical History:  Diagnosis Date   Abnormal EKG    LVH with repolarization changes   Arthritis    Diverticulitis    Headache    migraines    Hyperlipidemia    Hyperlipidemia    Visual disorder    Torn retina    Patient Active Problem List   Diagnosis Date Noted   Colonic diverticular abscess 08/27/2020   Family history of coronary artery disease 06/13/2018   Dizziness 10/31/2017   Essential hypertension 08/08/2013   Hyperlipidemia  08/08/2013   Abnormal EKG 08/08/2013    Past Surgical History:  Procedure Laterality Date   APPENDECTOMY     COLON SURGERY     DILATATION & CURETTAGE/HYSTEROSCOPY WITH MYOSURE N/A 05/11/2015   Procedure: DILATATION & CURETTAGE/HYSTEROSCOPY WITH MYOSURE POLYPECTOMY;  Surgeon: Servando Salina, MD;  Location: [facility];  Service: Gynecology;  Laterality: N/A;   EYE SURGERY     SHOULDER ARTHROSCOPY     TONSILLECTOMY       OB History   No obstetric history on file.     Family History  Problem Relation Age of Onset   Stroke Mother 21   Cancer Mother 67       Breast cancer   Hypertension Mother    Breast cancer Mother    Heart disease Father    Hypertension Father    Stroke Father 28   Heart disease Brother 36   Cancer Brother        Lung cancer   Cancer Maternal Grandmother        Breast cancer   Breast cancer Maternal Grandmother    Kidney disease Maternal Grandfather    Diabetes Paternal Grandmother    Hyperlipidemia Child    Breast cancer Maternal Aunt     Social History   Tobacco Use   Smoking status: Never   Smokeless tobacco: Never  Vaping Use   Vaping Use: Never used  Substance Use Topics   Alcohol use: No   Drug use: No    Home Medications Prior to Admission  medications   Medication Sig Start Date End Date Taking? Authorizing Provider  acetaminophen (TYLENOL) 500 MG tablet Take 1,000 mg by mouth every 6 (six) hours as needed for headache.    [provider]  amLODipine (NORVASC) 5 MG tablet TAKE 1 TABLET EACH DAY. 04/18/19   Lorretta Harp, MD  atorvastatin (LIPITOR) 20 MG tablet Take 20 mg by mouth at bedtime.     [provider]  CALCIUM CARBONATE-VITAMIN D PO Take 1,200 Units by mouth daily.    [provider]  cholecalciferol (VITAMIN D) 1000 UNITS tablet Take 1,000 Units by mouth daily.    [provider]  dorzolamide-timolol (COSOPT) 22.3-6.8 MG/ML ophthalmic solution Place 1 drop into the left eye 2 (two) times  daily.    [provider]  folic acid (FOLVITE) 1 MG tablet Take 3 mg by mouth daily. 05/29/18   [provider]  meclizine (ANTIVERT) 25 MG tablet Take 1 tablet (25 mg total) by mouth 2 (two) times daily. 08/17/18   Montine Circle, PA-C  Methotrexate 2.5 MG/ML SOLN Take 12.5 mg/mL by mouth. Use as directed    [provider]  Multiple Vitamins-Minerals (ICAPS MV PO) Take 1 tablet by mouth 2 (two) times daily.    [provider]  Polyethyl Glycol-Propyl Glycol 0.4-0.3 % SOLN Place 1 drop into the right eye as needed (For dry eyes.).     [provider]  prednisoLONE acetate (PRED FORTE) 1 % ophthalmic suspension Place 1 drop into the left eye 3 (three) times daily.    [provider]  rizatriptan (MAXALT-MLT) 10 MG disintegrating tablet Take 1 tablet by mouth daily as needed for migraine.  07/08/13   [provider]  triamterene-hydrochlorothiazide (MAXZIDE-25) 37.5-25 MG per tablet Take 0.5 tablets by mouth as needed (For swelling.).  07/01/13   [provider]  valACYclovir (VALTREX) 1000 MG tablet Take 1,000 mg by mouth daily. 05/29/18   [provider]    Allergies    Abreva [docosanol] and Codeine  Review of Systems   Review of Systems  All other systems reviewed and are negative.  Physical Exam Updated Vital Signs BP 113/80   Pulse 80   Temp 98.4 F (36.9 C) (Oral)   Resp 18   Ht 5' 2.5" (1.588 m)   Wt 67.6 kg   SpO2 95%   BMI 26.82 kg/m   Physical Exam Vitals and nursing note reviewed. Exam conducted with a chaperone present.  Constitutional:      General: She is not in acute distress.    Appearance: Normal appearance. She is not toxic-appearing.  HENT:     Head: Normocephalic and atraumatic.  Eyes:     General: No scleral icterus.    Conjunctiva/sclera: Conjunctivae normal.  Cardiovascular:     Rate and Rhythm: Normal rate.     Pulses: Normal pulses.  Pulmonary:     Effort: Pulmonary  effort is normal. No respiratory distress.  Abdominal:     General: Abdomen is flat. There is no distension.     Palpations: Abdomen is soft.     Tenderness: There is abdominal tenderness. There is no guarding.     Comments: Soft, nondistended.  Left lower quadrant and suprapubic region tenderness.  No overlying skin changes.  No guarding.  Musculoskeletal:     Right lower leg: No edema.     Left lower leg: No edema.  Skin:    General: Skin is dry.  Neurological:  Mental Status: She is alert.     GCS: GCS eye subscore is 4. GCS verbal subscore is 5. GCS motor subscore is 6.  Psychiatric:        Mood and Affect: Mood normal.        Behavior: Behavior normal.        Thought Content: Thought content normal.    ED Results / Procedures / Treatments   Labs (all labs ordered are listed, but only abnormal results are displayed) Labs Reviewed  CBC WITH DIFFERENTIAL/PLATELET - Abnormal; Notable for the following components:      Result Value   MCV 102.5 (*)    MCH 34.9 (*)    All other components within normal limits  COMPREHENSIVE METABOLIC PANEL - Abnormal; Notable for the following components:   Glucose, Bld 102 (*)    GFR, Estimated 58 (*)    All other components within normal limits  SARS CORONAVIRUS 2 (TAT 6-24 HRS)  CBC  BASIC METABOLIC PANEL    EKG None  Radiology CT ABDOMEN PELVIS W CONTRAST  Result Date: 08/27/2020 CLINICAL DATA:  Generalized abdominal pain. EXAM: CT ABDOMEN AND PELVIS WITH CONTRAST TECHNIQUE: Multidetector CT imaging of the abdomen and pelvis was performed using the standard protocol following bolus administration of intravenous contrast. CONTRAST:  158mL ISOVUE-300 IOPAMIDOL (ISOVUE-300) INJECTION 61% COMPARISON:  None. FINDINGS: Lower chest: Mild bibasilar atelectasis. Lung bases are otherwise clear. Heart size within normal limits. Hepatobiliary: No focal liver abnormality is seen. No gallstones, gallbladder wall thickening, or biliary dilatation.  Pancreas: Unremarkable. No pancreatic ductal dilatation or surrounding inflammatory changes. Spleen: Normal in size without focal abnormality. Adrenals/Urinary Tract: Unremarkable adrenal glands. Probable bilateral extrarenal pelvises or possibly mild bilateral hydronephrosis. No renal or ureteral stone identified. Kidneys enhance symmetrically. No focal lesion. Urinary bladder is within normal limits. Stomach/Bowel: Oral contrast is present throughout the bowel. Stomach is within normal limits. No dilated loops of bowel. Short segment of markedly thickened, inflamed bowel at the rectosigmoid junction containing numerous diverticula. Prominent pericolonic fat stranding and trace fluid. Possible developing intramural abscess along the colonic wall at this segment measuring approximately 2.4 x 1.1 cm (series 2, image 64). Otherwise, no organized fluid collection or abscess. No extraluminal air. Numerous diverticula throughout the remaining sigmoid colon. Vascular/Lymphatic: Scattered aortoiliac atherosclerotic calcifications without aneurysm. Circumaortic left renal vein, an anatomic variant. No abdominopelvic lymphadenopathy. Reproductive: Uterus and bilateral adnexa are unremarkable. Other: No pneumoperitoneum.  No abdominal wall hernia. Musculoskeletal: No acute or significant osseous findings. Degenerative disc disease within the lumbar spine. IMPRESSION: 1. Acute diverticulitis at the rectosigmoid junction with possible developing intramural abscess. 2. Probable bilateral extrarenal pelvises or possibly mild bilateral hydronephrosis. 3. Aortic atherosclerosis (ICD10-I70.0). These results will be called to the ordering clinician or representative by the Radiologist Assistant, and communication documented in the PACS or [employer]. Electronically Signed   By: Davina Poke D.O.   On: 08/27/2020 13:15    Procedures Procedures   Medications Ordered in ED Medications  cefTRIAXone (ROCEPHIN) 2 g in  sodium chloride 0.9 % 100 mL IVPB (0 g Intravenous Stopped 08/27/20 1654)    And  metroNIDAZOLE (FLAGYL) IVPB 500 mg (500 mg Intravenous New Bag/Given 08/27/20 1700)  sodium chloride 0.9 % bolus 500 mL (has no administration in time range)  acetaminophen (TYLENOL) tablet 500 mg (has no administration in time range)  rizatriptan (MAXALT-MLT) disintegrating tablet 10 mg (has no administration in time range)  valACYclovir (VALTREX) tablet 1,000 mg (has no administration in time range)  atorvastatin (LIPITOR) tablet 20 mg (has no administration in time range)  folic acid (FOLVITE) tablet 3 mg (has no administration in time range)  morphine 2 MG/ML injection 2 mg (has no administration in time range)  0.9 %  sodium chloride infusion (has no administration in time range)  ondansetron (ZOFRAN) injection 4 mg (has no administration in time range)  piperacillin-tazobactam (ZOSYN) IVPB 3.375 g (has no administration in time range)  piperacillin-tazobactam (ZOSYN) IVPB 3.375 g (has no administration in time range)    ED Course  I have reviewed the triage vital signs and the nursing notes.  Pertinent labs & imaging results that were available during my care of the patient were reviewed by me and considered in my medical decision making (see chart for details).  Clinical Course as of 08/27/20 1718  Thu Aug 27, 2020  1601 I spoke with Dr. Therisa Doyne with [facility] who recommends that we connect with general surgery prior to admission to hospitalist. [GG]  1639 I spoke with Dr. Tawanna Solo who will admit patient for fluids and IV antibiotics.  I still am waiting to hear back from general surgery who will need to be involved, although unlikely that there will be any imminent intervention. [GG]    Clinical Course User Index [GG] Corena Herter, PA-C   MDM Rules/Calculators/A&P                          ARTEMISIA AUVIL was evaluated in Emergency Department on 08/27/2020 for the symptoms described in the history  of present illness. She was evaluated in the context of the global COVID-19 pandemic, which necessitated consideration that the patient might be at risk for infection with the SARS-CoV-2 virus that causes COVID-19. Institutional protocols and algorithms that pertain to the evaluation of patients at risk for COVID-19 are in a state of rapid change based on information released by regulatory bodies including the CDC and federal and state organizations. These policies and algorithms were followed during the patient's care in the ED.  I personally reviewed patient's medical chart and all notes from triage and staff during today's encounter. I have also ordered and reviewed all labs and imaging that I felt to be medically necessary in the evaluation of this patient's complaints and with consideration of their physical exam. If needed, translation services were available and utilized.   Patient has diverticulitis rectosigmoid junction with possible developing intramural abscess.  She was sent here by her gastroenterologist given need for IV antibiotics.  Will consult with [facility] and admit to hospitalist.  Will initiate treatment with IV Rocephin and Flagyl.  She denies any requisite for analgesics at this time.  We will also provide 1 L IV NS and IV Zofran given diminished p.o. intake over the course of the past 72 hours.  We will proceed with basic laboratory work-up and COVID-19 screening.  I spoke with Dr. Therisa Doyne with [facility] who recommends that we connect with general surgery prior to admission to hospitalist.  CBC without leukocytosis.  Vital signs are stable.  I spoke with Dr. Tawanna Solo who will admit patient for fluids and IV antibiotics.  I still am waiting to hear back from general surgery who will need to be involved, although unlikely that there will be any imminent intervention.   Final Clinical Impression(s) / ED Diagnoses Final diagnoses:  Diverticulitis of large intestine with abscess,  unspecified bleeding status    Rx / DC Orders ED  Discharge Orders     None        Reita Chard 08/27/20 1718    Sherwood Gambler, MD 08/27/20 2021

## 2020-08-27 NOTE — ED Triage Notes (Signed)
Patient reports that she went to her GI doctor today and was told she had diverticulitis with an abscess and for her to come to the ED for IV antibiotics

## 2020-08-27 NOTE — Consult Note (Signed)
Reason for Consult:diverticulitis Referring Physician: Krista Blue, PA-C  [name] is an 80 y.o. female.  HPI: 65 yof with history htn, hld and retinal issues that she is on mtx for presents with abd pain primarily llq since Sunday.  Never had proven diverticulitis before.  Is up to date on csc. States she had prior right colectomy for unresectable polyp and that she was told by her GI physician she can no longer get csc due to a stricture somewhere (?).  She has had difficulty urinating and having bms due to pain.  She had low grade fever.  She underwent outpt ct from GI that shows likely diverticulitis and intramural abscess.  Has nl wbc and no fever here.  She was sent by them to ER for admission and I was asked to see her.  I dont have any of there information.    Past Medical History:  Diagnosis Date   Abnormal EKG    LVH with repolarization changes   Arthritis    Diverticulitis    Headache    migraines    Hyperlipidemia    Hyperlipidemia    Visual disorder    Torn retina    Past Surgical History:  Procedure Laterality Date   APPENDECTOMY     COLON SURGERY     DILATATION & CURETTAGE/HYSTEROSCOPY WITH MYOSURE N/A 05/11/2015   Procedure: DILATATION & CURETTAGE/HYSTEROSCOPY WITH MYOSURE POLYPECTOMY;  Surgeon: Servando Salina, MD;  Location: [facility];  Service: Gynecology;  Laterality: N/A;   EYE SURGERY     SHOULDER ARTHROSCOPY     TONSILLECTOMY      Family History  Problem Relation Age of Onset   Stroke Mother 9   Cancer Mother 34       Breast cancer   Hypertension Mother    Breast cancer Mother    Heart disease Father    Hypertension Father    Stroke Father 32   Heart disease Brother 35   Cancer Brother        Lung cancer   Cancer Maternal Grandmother        Breast cancer   Breast cancer Maternal Grandmother    Kidney disease Maternal Grandfather    Diabetes Paternal Grandmother    Hyperlipidemia Child    Breast cancer Maternal Aunt     Social  History:  reports that she has never smoked. She has never used smokeless tobacco. She reports that she does not drink alcohol and does not use drugs.  Allergies:  Allergies  Allergen Reactions   Abreva [Docosanol] Swelling and Other (See Comments)    Top lip and side of face swollen   Codeine Nausea Only   Fosamax [Alendronate]     Other reaction(s): chest pain    Medications: I have reviewed the patient's current medications.  Results for orders placed or performed during the hospital encounter of 08/27/20 (from the past 48 hour(s))  CBC with Differential     Status: Abnormal   Collection Time: 08/27/20  3:07 PM  Result Value Ref Range   WBC 9.8 4.0 - 10.5 K/uL   RBC 3.98 3.87 - 5.11 MIL/uL   Hemoglobin 13.9 12.0 - 15.0 g/dL   HCT 40.8 36.0 - 46.0 %   MCV 102.5 (H) 80.0 - 100.0 fL   MCH 34.9 (H) 26.0 - 34.0 pg   MCHC 34.1 30.0 - 36.0 g/dL   RDW 14.3 11.5 - 15.5 %   Platelets 183 150 - 400 K/uL   nRBC 0.0  0.0 - 0.2 %   Neutrophils Relative % 77 %   Neutro Abs 7.5 1.7 - 7.7 K/uL   Lymphocytes Relative 11 %   Lymphs Abs 1.1 0.7 - 4.0 K/uL   Monocytes Relative 9 %   Monocytes Absolute 0.9 0.1 - 1.0 K/uL   Eosinophils Relative 3 %   Eosinophils Absolute 0.3 0.0 - 0.5 K/uL   Basophils Relative 0 %   Basophils Absolute 0.0 0.0 - 0.1 K/uL   Immature Granulocytes 0 %   Abs Immature Granulocytes 0.03 0.00 - 0.07 K/uL    Comment: Performed at [facility], [place] [address] [place], [place] [address]  Comprehensive metabolic panel     Status: Abnormal   Collection Time: 08/27/20  3:07 PM  Result Value Ref Range   Sodium 136 135 - 145 mmol/L   Potassium 3.7 3.5 - 5.1 mmol/L   Chloride 102 98 - 111 mmol/L   CO2 26 22 - 32 mmol/L   Glucose, Bld 102 (H) 70 - 99 mg/dL    Comment: Glucose reference range applies only to samples taken after fasting for at least 8 hours.   BUN 8 8 - 23 mg/dL   Creatinine, Ser 0.99 0.44 - 1.00 mg/dL   Calcium 9.1 8.9 - 10.3 mg/dL    Total Protein 6.9 6.5 - 8.1 g/dL   Albumin 3.8 3.5 - 5.0 g/dL   AST 20 15 - 41 U/L   ALT 27 0 - 44 U/L   Alkaline Phosphatase 60 38 - 126 U/L   Total Bilirubin 1.0 0.3 - 1.2 mg/dL   GFR, Estimated 58 (L) >60 mL/min    Comment: (NOTE) Calculated using the CKD-EPI Creatinine Equation (2021)    Anion gap 8 5 - 15    Comment: Performed at [facility], [place] [address] [place], [place] [address]    CT ABDOMEN PELVIS W CONTRAST  Result Date: 08/27/2020 CLINICAL DATA:  Generalized abdominal pain. EXAM: CT ABDOMEN AND PELVIS WITH CONTRAST TECHNIQUE: Multidetector CT imaging of the abdomen and pelvis was performed using the standard protocol following bolus administration of intravenous contrast. CONTRAST:  177mL ISOVUE-300 IOPAMIDOL (ISOVUE-300) INJECTION 61% COMPARISON:  None. FINDINGS: Lower chest: Mild bibasilar atelectasis. Lung bases are otherwise clear. Heart size within normal limits. Hepatobiliary: No focal liver abnormality is seen. No gallstones, gallbladder wall thickening, or biliary dilatation. Pancreas: Unremarkable. No pancreatic ductal dilatation or surrounding inflammatory changes. Spleen: Normal in size without focal abnormality. Adrenals/Urinary Tract: Unremarkable adrenal glands. Probable bilateral extrarenal pelvises or possibly mild bilateral hydronephrosis. No renal or ureteral stone identified. Kidneys enhance symmetrically. No focal lesion. Urinary bladder is within normal limits. Stomach/Bowel: Oral contrast is present throughout the bowel. Stomach is within normal limits. No dilated loops of bowel. Short segment of markedly thickened, inflamed bowel at the rectosigmoid junction containing numerous diverticula. Prominent pericolonic fat stranding and trace fluid. Possible developing intramural abscess along the colonic wall at this segment measuring approximately 2.4 x 1.1 cm (series 2, image 64). Otherwise, no organized fluid collection or abscess. No  extraluminal air. Numerous diverticula throughout the remaining sigmoid colon. Vascular/Lymphatic: Scattered aortoiliac atherosclerotic calcifications without aneurysm. Circumaortic left renal vein, an anatomic variant. No abdominopelvic lymphadenopathy. Reproductive: Uterus and bilateral adnexa are unremarkable. Other: No pneumoperitoneum.  No abdominal wall hernia. Musculoskeletal: No acute or significant osseous findings. Degenerative disc disease within the lumbar spine. IMPRESSION: 1. Acute diverticulitis at the rectosigmoid junction with possible developing intramural abscess. 2. Probable bilateral extrarenal pelvises or  possibly mild bilateral hydronephrosis. 3. Aortic atherosclerosis (ICD10-I70.0). These results will be called to the ordering clinician or representative by the Radiologist Assistant, and communication documented in the PACS or [employer]. Electronically Signed   By: Davina Poke D.O.   On: 08/27/2020 13:15    Review of Systems  Constitutional:  Positive for fever.  Gastrointestinal:  Positive for abdominal pain and constipation.  All other systems reviewed and are negative. Blood pressure 127/64, pulse 74, temperature 98.8 F (37.1 C), temperature source Oral, resp. rate 17, height 5' 2.5" (1.588 m), weight 67.6 kg, SpO2 98 %. Physical Exam Constitutional:      General: She is not in acute distress.    Appearance: She is well-developed.  HENT:     Head: Normocephalic and atraumatic.  Eyes:     General: No scleral icterus. Cardiovascular:     Rate and Rhythm: Normal rate and regular rhythm.  Pulmonary:     Effort: Pulmonary effort is normal.     Breath sounds: Normal breath sounds.  Abdominal:     Palpations: Abdomen is soft.     Tenderness: There is abdominal tenderness (very mild) in the left lower quadrant.     Hernia: No hernia is present.  Skin:    General: Skin is warm and dry.     Capillary Refill: Capillary refill takes less than 2 seconds.   Neurological:     General: No focal deficit present.     Mental Status: She is alert.  Psychiatric:        Mood and Affect: Mood normal.        Behavior: Behavior normal.    Assessment/Plan: Diverticulitis with intramural abscess -can have liquids, doubt she will need surgery -on iv abx, normal wbc, no real tenderness or pain right now, urinating and had a bm -no attempt at outpatient therapy I suppose due to mtx and was admitted -can follow with you   Rolm Bookbinder 08/27/2020, 10:53 PM

## 2020-08-27 NOTE — Progress Notes (Signed)
Pharmacy Antibiotic Note  [name] is a 80 y.o. female who was instructed to come to the ED on 08/27/20 after she was found to have "diverticulitis at the rectosigmoid junction with possible developing intramural abscess" on abd CT.  Pharmacy has been consulted to dose zosyn for infection.  Plan: - zosyn 3.375gm IV x1 over 30 min., then 3.375 gm IV q8h (infuse over 8 hrs) - Pharmacy will sign off for abx consult. Re-consult [place] if need further assistance. ____________________________________________  Height: 5' 2.5" (158.8 cm) Weight: 67.6 kg (149 lb) IBW/kg (Calculated) : 51.25  Temp (24hrs), Avg:98.4 F (36.9 C), Min:98.4 F (36.9 C), Max:98.4 F (36.9 C)  Recent Labs  Lab 08/27/20 1507  WBC 9.8  CREATININE 0.99    Estimated Creatinine Clearance: 42.7 mL/min (by C-G formula based on SCr of 0.99 mg/dL).    Allergies  Allergen Reactions   Abreva [Docosanol] Swelling and Other (See Comments)    Top lip and side of face swollen   Codeine Nausea Only     Thank you for allowing pharmacy to be a part of this patient's care.  Lynelle Doctor 08/27/2020 5:01 PM

## 2020-08-28 DIAGNOSIS — K5720 Diverticulitis of large intestine with perforation and abscess without bleeding: Secondary | ICD-10-CM | POA: Diagnosis not present

## 2020-08-28 LAB — URINALYSIS, COMPLETE (UACMP) WITH MICROSCOPIC
Bacteria, UA: NONE SEEN
Bilirubin Urine: NEGATIVE
Glucose, UA: NEGATIVE mg/dL
Hgb urine dipstick: NEGATIVE
Ketones, ur: 80 mg/dL — AB
Leukocytes,Ua: NEGATIVE
Nitrite: NEGATIVE
Protein, ur: NEGATIVE mg/dL
Specific Gravity, Urine: 1.005 (ref 1.005–1.030)
pH: 6 (ref 5.0–8.0)

## 2020-08-28 LAB — BASIC METABOLIC PANEL
Anion gap: 9 (ref 5–15)
BUN: 7 mg/dL — ABNORMAL LOW (ref 8–23)
CO2: 26 mmol/L (ref 22–32)
Calcium: 8.6 mg/dL — ABNORMAL LOW (ref 8.9–10.3)
Chloride: 105 mmol/L (ref 98–111)
Creatinine, Ser: 0.84 mg/dL (ref 0.44–1.00)
GFR, Estimated: >60 mL/min (ref >60)
Glucose, Bld: 82 mg/dL (ref 70–99)
Potassium: 3.8 mmol/L (ref 3.5–5.1)
Sodium: 140 mmol/L (ref 135–145)

## 2020-08-28 LAB — CBC
HCT: 38.7 % (ref 36.0–46.0)
Hemoglobin: 12.8 g/dL (ref 12.0–15.0)
MCH: 34.3 pg — ABNORMAL HIGH (ref 26.0–34.0)
MCHC: 33.1 g/dL (ref 30.0–36.0)
MCV: 103.8 fL — ABNORMAL HIGH (ref 80.0–100.0)
Platelets: 174 10*3/uL (ref 150–400)
RBC: 3.73 MIL/uL — ABNORMAL LOW (ref 3.87–5.11)
RDW: 14.4 % (ref 11.5–15.5)
WBC: 8.1 10*3/uL (ref 4.0–10.5)
nRBC: 0 % (ref 0.0–0.2)

## 2020-08-28 LAB — SARS CORONAVIRUS 2 (TAT 6-24 HRS): SARS Coronavirus 2: NEGATIVE

## 2020-08-28 MED ORDER — ENOXAPARIN SODIUM 40 MG/0.4ML IJ SOSY
40.0000 mg | PREFILLED_SYRINGE | Freq: Every day | INTRAMUSCULAR | Status: DC
  Administered 2020-08-28 – 2020-08-29 (×2): 40 mg via SUBCUTANEOUS
  Filled 2020-08-28 (×2): qty 0.4

## 2020-08-28 NOTE — Plan of Care (Signed)
Plan of care reviewed and discussed with the patient. 

## 2020-08-28 NOTE — Progress Notes (Addendum)
PROGRESS NOTE    [name]  XBJ:[id number] DOB: Jul 24, 1940 DOA: 08/27/2020 PCP: Deland Pretty, MD   Chief Complain:Abd pain  Brief Narrative: [name] is a 80 y.o. female with medical history significant of hypertension, hyperlipidemia, diverticulosis, retinitis who presented to the emergency department after she was referred from her gastroenterologist office .  Patient was having abdominal pain since last few days.  Pain was mainly in the left lower quadrant that was radiating to the mid abdomen.  She was unable to walk or sit and the pain was excruciating and she was unable to defecate or pass urine.  Patient also reported mild grade fever, nausea.CT abdomen/pelvis showed acute diverticulitis at the rectosigmoid junction with possible developing intramural abscess.  General surgery following.   Assessment & Plan:   Principal Problem:   Colonic diverticular abscess Active Problems:   Essential hypertension   Hyperlipidemia  Acute diverticulitis with possible developing intramural abscess: Presented with left lower quadrant pain, nausea, acute fever.  Afebrile on presentation, no leukocytosis.  Continue Zosyn for now.  Continue IV fluids, antiemetics, pain medications.  General surgery also consulted and following.  Currently on clear liquid diet Patient has history of diverticulosis and follows with [facility].  Dysuria: Patient complains of difficulty passing urine.  Will check UA.  This is most likely from the irritation of the bladder from nearby abscess/diverticulitis   Hypertension: Current blood pressure stable.  She takes amlodipine, Maxide at home.  Antihypertensives on hold.  Continue pain medication for severe hypertension   Hyperlipidemia: Takes Lipitor at home, continued   History of retinitis: Follows with ophthalmology and takes methotrexate every week along with folic acid.   History of osteoarthritis/osteoporosis: Takes calcium and vitamin D supplements at  home.            DVT prophylaxis: Lovenox Code Status: Full Family Communication: Daughter at the bedside Status is: Inpatient  Remains inpatient appropriate because:IV treatments appropriate due to intensity of illness or inability to take PO  Dispo: The patient is from: Home              Anticipated d/c is to: Home              Patient currently is not medically stable to d/c.   Difficult to place patient No    Consultants: General surgery  Procedures:None  Antimicrobials:  Anti-infectives (From admission, onward)    Start     Dose/Rate Route Frequency Ordered Stop   08/27/20 2300  piperacillin-tazobactam (ZOSYN) IVPB 3.375 g        3.375 g 12.5 mL/hr over 240 Minutes Intravenous Every 8 hours 08/27/20 1706     08/27/20 1715  piperacillin-tazobactam (ZOSYN) IVPB 3.375 g        3.375 g 100 mL/hr over 30 Minutes Intravenous  Once 08/27/20 1706 08/27/20 1917   08/27/20 1700  valACYclovir (VALTREX) tablet 1,000 mg        1,000 mg Oral Daily 08/27/20 1659     08/27/20 1530  cefTRIAXone (ROCEPHIN) 2 g in sodium chloride 0.9 % 100 mL IVPB       See Hyperspace for full Linked Orders Report.   2 g 200 mL/hr over 30 Minutes Intravenous  Once 08/27/20 1518 08/27/20 1654   08/27/20 1530  metroNIDAZOLE (FLAGYL) IVPB 500 mg       See Hyperspace for full Linked Orders Report.   500 mg 100 mL/hr over 60 Minutes Intravenous  Once 08/27/20 1518 08/27/20 1800  Subjective: Patient seen and examined the bedside this morning.  Hemodynamically stable.  Afebrile.  Denies any worsening abdominal pain, nausea or vomiting.  Still having problem with passing urine or defecation but gradually feeling better  Objective: Vitals:   08/27/20 1814 08/27/20 2104 08/28/20 0145 08/28/20 0540  BP: (!) 132/96 127/64 120/73 114/77  Pulse: 77 74 93 98  Resp: 19 17 17 16   Temp: 98.6 F (37 C) 98.8 F (37.1 C) 98 F (36.7 C) 97.8 F (36.6 C)  TempSrc: Oral Oral Oral   SpO2: 98% 98% 97%  95%  Weight:      Height:        Intake/Output Summary (Last 24 hours) at 08/28/2020 0740 Last data filed at 08/28/2020 0200 Gross per 24 hour  Intake 520.97 ml  Output --  Net 520.97 ml   Filed Weights   08/27/20 1453  Weight: 67.6 kg    Examination:  General exam: Overall comfortable, not in distress,pleasant female HEENT: PERRL Respiratory system:  no wheezes or crackles  Cardiovascular system: S1 & S2 heard, RRR.  Gastrointestinal system: Abdomen is nondistended, soft and is tender on the left lower quadrant. Central nervous system: Alert and oriented Extremities: No edema, no clubbing ,no cyanosis Skin: No rashes, no ulcers,no icterus      Data Reviewed: I have personally reviewed following labs and imaging studies  CBC: Recent Labs  Lab 08/27/20 1507 08/28/20 0319  WBC 9.8 8.1  NEUTROABS 7.5  --   HGB 13.9 12.8  HCT 40.8 38.7  MCV 102.5* 103.8*  PLT 183 027   Basic Metabolic Panel: Recent Labs  Lab 08/27/20 1507 08/28/20 0319  NA 136 140  K 3.7 3.8  CL 102 105  CO2 26 26  GLUCOSE 102* 82  BUN 8 7*  CREATININE 0.99 0.84  CALCIUM 9.1 8.6*   GFR: Estimated Creatinine Clearance: 50.4 mL/min (by C-G formula based on SCr of 0.84 mg/dL). Liver Function Tests: Recent Labs  Lab 08/27/20 1507  AST 20  ALT 27  ALKPHOS 60  BILITOT 1.0  PROT 6.9  ALBUMIN 3.8   No results for input(s): LIPASE, AMYLASE in the last 168 hours. No results for input(s): AMMONIA in the last 168 hours. Coagulation Profile: No results for input(s): INR, PROTIME in the last 168 hours. Cardiac Enzymes: No results for input(s): CKTOTAL, CKMB, CKMBINDEX, TROPONINI in the last 168 hours. BNP (last 3 results) No results for input(s): PROBNP in the last 8760 hours. HbA1C: No results for input(s): HGBA1C in the last 72 hours. CBG: No results for input(s): GLUCAP in the last 168 hours. Lipid Profile: No results for input(s): CHOL, HDL, LDLCALC, TRIG, CHOLHDL, LDLDIRECT in the  last 72 hours. Thyroid Function Tests: No results for input(s): TSH, T4TOTAL, FREET4, T3FREE, THYROIDAB in the last 72 hours. Anemia Panel: No results for input(s): VITAMINB12, FOLATE, FERRITIN, TIBC, IRON, RETICCTPCT in the last 72 hours. Sepsis Labs: No results for input(s): PROCALCITON, LATICACIDVEN in the last 168 hours.  Recent Results (from the past 240 hour(s))  SARS CORONAVIRUS 2 (TAT 6-24 HRS) Nasopharyngeal Nasopharyngeal Swab     Status: None   Collection Time: 08/27/20  4:29 PM   Specimen: Nasopharyngeal Swab  Result Value Ref Range Status   SARS Coronavirus 2 NEGATIVE NEGATIVE Final    Comment: (NOTE) SARS-CoV-2 target nucleic acids are NOT DETECTED.  The SARS-CoV-2 RNA is generally detectable in upper and lower respiratory specimens during the acute phase of infection. Negative results do not preclude SARS-CoV-2  infection, do not rule out co-infections with other pathogens, and should not be used as the sole basis for treatment or other patient management decisions. Negative results must be combined with clinical observations, patient history, and epidemiological information. The expected result is Negative.  Fact Sheet for Patients: SugarRoll.be  Fact Sheet for Healthcare Providers: https://www.woods-mathews.com/  This test is not yet approved or cleared by the [place] FDA and  has been authorized for detection and/or diagnosis of SARS-CoV-2 by FDA under an Emergency Use Authorization (EUA). This EUA will remain  in effect (meaning this test can be used) for the duration of the COVID-19 declaration under Se ction 564(b)(1) of the Act, 21 U.S.C. section 360bbb-3(b)(1), unless the authorization is terminated or revoked sooner.  Performed at [facility], [place] [address] [place], [place] [address]          Radiology Studies: CT ABDOMEN PELVIS W CONTRAST  Result Date: 08/27/2020 CLINICAL DATA:   Generalized abdominal pain. EXAM: CT ABDOMEN AND PELVIS WITH CONTRAST TECHNIQUE: Multidetector CT imaging of the abdomen and pelvis was performed using the standard protocol following bolus administration of intravenous contrast. CONTRAST:  192mL ISOVUE-300 IOPAMIDOL (ISOVUE-300) INJECTION 61% COMPARISON:  None. FINDINGS: Lower chest: Mild bibasilar atelectasis. Lung bases are otherwise clear. Heart size within normal limits. Hepatobiliary: No focal liver abnormality is seen. No gallstones, gallbladder wall thickening, or biliary dilatation. Pancreas: Unremarkable. No pancreatic ductal dilatation or surrounding inflammatory changes. Spleen: Normal in size without focal abnormality. Adrenals/Urinary Tract: Unremarkable adrenal glands. Probable bilateral extrarenal pelvises or possibly mild bilateral hydronephrosis. No renal or ureteral stone identified. Kidneys enhance symmetrically. No focal lesion. Urinary bladder is within normal limits. Stomach/Bowel: Oral contrast is present throughout the bowel. Stomach is within normal limits. No dilated loops of bowel. Short segment of markedly thickened, inflamed bowel at the rectosigmoid junction containing numerous diverticula. Prominent pericolonic fat stranding and trace fluid. Possible developing intramural abscess along the colonic wall at this segment measuring approximately 2.4 x 1.1 cm (series 2, image 64). Otherwise, no organized fluid collection or abscess. No extraluminal air. Numerous diverticula throughout the remaining sigmoid colon. Vascular/Lymphatic: Scattered aortoiliac atherosclerotic calcifications without aneurysm. Circumaortic left renal vein, an anatomic variant. No abdominopelvic lymphadenopathy. Reproductive: Uterus and bilateral adnexa are unremarkable. Other: No pneumoperitoneum.  No abdominal wall hernia. Musculoskeletal: No acute or significant osseous findings. Degenerative disc disease within the lumbar spine. IMPRESSION: 1. Acute  diverticulitis at the rectosigmoid junction with possible developing intramural abscess. 2. Probable bilateral extrarenal pelvises or possibly mild bilateral hydronephrosis. 3. Aortic atherosclerosis (ICD10-I70.0). These results will be called to the ordering clinician or representative by the Radiologist Assistant, and communication documented in the PACS or [employer]. Electronically Signed   By: Davina Poke D.O.   On: 08/27/2020 13:15        Scheduled Meds:  atorvastatin  20 mg Oral QHS   dorzolamide-timolol  1 drop Both Eyes BID   folic acid  3 mg Oral Daily   valACYclovir  1,000 mg Oral Daily   Continuous Infusions:  sodium chloride 100 mL/hr at 08/27/20 1809   piperacillin-tazobactam (ZOSYN)  IV 3.375 g (08/28/20 0657)     LOS: 1 day    Time spent: 25 mins,More than 50% of that time was spent in counseling and/or coordination of care.      Shelly Coss, MD [facility] P6/17/2022, 7:40 AM

## 2020-08-28 NOTE — Progress Notes (Signed)
Patient reported to me that there was bloody mucous in her stool recently. Patient has complained of pain when trying to have a BM today. When she tried having a BM (when there was bloody mucous), she said it was not quite as painful as earlier. Made Dr. Tawanna Solo, MD aware of this.

## 2020-08-28 NOTE — Progress Notes (Addendum)
ANTICOAGULATION CONSULT NOTE - Initial Consult  Pharmacy Consult for LMWH Indication: VTE px  Allergies  Allergen Reactions   Abreva [Docosanol] Swelling and Other (See Comments)    Top lip and side of face swollen   Codeine Nausea Only   Fosamax [Alendronate]     Other reaction(s): chest pain    Patient Measurements: Height: 5' 2.5" (158.8 cm) Weight: 67.6 kg (149 lb) IBW/kg (Calculated) : 51.25 Heparin Dosing Weight:   Vital Signs: Temp: 97.8 F (36.6 C) (06/17 0540) Temp Source: Oral (06/17 0145) BP: 114/77 (06/17 0540) Pulse Rate: 98 (06/17 0540)  Labs: Recent Labs    08/27/20 1507 08/28/20 0319  HGB 13.9 12.8  HCT 40.8 38.7  PLT 183 174  CREATININE 0.99 0.84    Estimated Creatinine Clearance: 50.4 mL/min (by C-G formula based on SCr of 0.84 mg/dL).   Medical History: Past Medical History:  Diagnosis Date   Abnormal EKG    LVH with repolarization changes   Arthritis    Diverticulitis    Headache    migraines    Hyperlipidemia    Hyperlipidemia    Visual disorder    Torn retina      Plan:  [place] 40 q24 for VTE px Pharmacy to sign off  Eudelia Bunch, Pharm.D 08/28/2020 11:52 AM

## 2020-08-28 NOTE — Progress Notes (Addendum)
Subjective: CC: Patient reports she is feeling better. She is still having intermittent mild LLQ abdominal pain that is improved from yesterday. Notes she had an episode of diarrhea last night after drinking oral contrast for CT. Passing flatus. No n/v.   Objective: Vital signs in last 24 hours: Temp:  [97.8 F (36.6 C)-98.8 F (37.1 C)] 97.8 F (36.6 C) (06/17 0540) Pulse Rate:  [69-98] 98 (06/17 0540) Resp:  [16-19] 16 (06/17 0540) BP: (113-149)/(64-96) 114/77 (06/17 0540) SpO2:  [95 %-99 %] 95 % (06/17 0540) Weight:  [67.6 kg] 67.6 kg (06/16 1453) Last BM Date: 08/27/20  Intake/Output from previous day: 06/16 0701 - 06/17 0700 In: 892.5 [P.O.:30; I.V.:828.1; IV Piggyback:34.4] Out: -  Intake/Output this shift: Total I/O In: 240 [P.O.:240] Out: -   PE: Gen:  Alert, NAD, pleasant Card:  RRR Pulm:  CTAB, no W/R/R, effort normal Abd: Soft, ND, very minimal tenderness of the LLQ without peritonitis. +BS.  Ext:  No LE edema  Psych: A&Ox3  Skin: no rashes noted, warm and dry   Lab Results:  Recent Labs    08/27/20 1507 08/28/20 0319  WBC 9.8 8.1  HGB 13.9 12.8  HCT 40.8 38.7  PLT 183 174   BMET Recent Labs    08/27/20 1507 08/28/20 0319  NA 136 140  K 3.7 3.8  CL 102 105  CO2 26 26  GLUCOSE 102* 82  BUN 8 7*  CREATININE 0.99 0.84  CALCIUM 9.1 8.6*   PT/INR No results for input(s): LABPROT, INR in the last 72 hours. CMP     Component Value Date/Time   NA 140 08/28/2020 0319   K 3.8 08/28/2020 0319   CL 105 08/28/2020 0319   CO2 26 08/28/2020 0319   GLUCOSE 82 08/28/2020 0319   BUN 7 (L) 08/28/2020 0319   CREATININE 0.84 08/28/2020 0319   CALCIUM 8.6 (L) 08/28/2020 0319   PROT 6.9 08/27/2020 1507   ALBUMIN 3.8 08/27/2020 1507   AST 20 08/27/2020 1507   ALT 27 08/27/2020 1507   ALKPHOS 60 08/27/2020 1507   BILITOT 1.0 08/27/2020 1507   GFRNONAA >60 08/28/2020 0319   GFRAA >60 08/16/2018 2259   Lipase  No results found for:  LIPASE     Studies/Results: CT ABDOMEN PELVIS W CONTRAST  Result Date: 08/27/2020 CLINICAL DATA:  Generalized abdominal pain. EXAM: CT ABDOMEN AND PELVIS WITH CONTRAST TECHNIQUE: Multidetector CT imaging of the abdomen and pelvis was performed using the standard protocol following bolus administration of intravenous contrast. CONTRAST:  122mL ISOVUE-300 IOPAMIDOL (ISOVUE-300) INJECTION 61% COMPARISON:  None. FINDINGS: Lower chest: Mild bibasilar atelectasis. Lung bases are otherwise clear. Heart size within normal limits. Hepatobiliary: No focal liver abnormality is seen. No gallstones, gallbladder wall thickening, or biliary dilatation. Pancreas: Unremarkable. No pancreatic ductal dilatation or surrounding inflammatory changes. Spleen: Normal in size without focal abnormality. Adrenals/Urinary Tract: Unremarkable adrenal glands. Probable bilateral extrarenal pelvises or possibly mild bilateral hydronephrosis. No renal or ureteral stone identified. Kidneys enhance symmetrically. No focal lesion. Urinary bladder is within normal limits. Stomach/Bowel: Oral contrast is present throughout the bowel. Stomach is within normal limits. No dilated loops of bowel. Short segment of markedly thickened, inflamed bowel at the rectosigmoid junction containing numerous diverticula. Prominent pericolonic fat stranding and trace fluid. Possible developing intramural abscess along the colonic wall at this segment measuring approximately 2.4 x 1.1 cm (series 2, image 64). Otherwise, no organized fluid collection or abscess. No extraluminal air. Numerous diverticula  throughout the remaining sigmoid colon. Vascular/Lymphatic: Scattered aortoiliac atherosclerotic calcifications without aneurysm. Circumaortic left renal vein, an anatomic variant. No abdominopelvic lymphadenopathy. Reproductive: Uterus and bilateral adnexa are unremarkable. Other: No pneumoperitoneum.  No abdominal wall hernia. Musculoskeletal: No acute or  significant osseous findings. Degenerative disc disease within the lumbar spine. IMPRESSION: 1. Acute diverticulitis at the rectosigmoid junction with possible developing intramural abscess. 2. Probable bilateral extrarenal pelvises or possibly mild bilateral hydronephrosis. 3. Aortic atherosclerosis (ICD10-I70.0). These results will be called to the ordering clinician or representative by the Radiologist Assistant, and communication documented in the PACS or [employer]. Electronically Signed   By: Davina Poke D.O.   On: 08/27/2020 13:15    Anti-infectives: Anti-infectives (From admission, onward)    Start     Dose/Rate Route Frequency Ordered Stop   08/27/20 2300  piperacillin-tazobactam (ZOSYN) IVPB 3.375 g        3.375 g 12.5 mL/hr over 240 Minutes Intravenous Every 8 hours 08/27/20 1706     08/27/20 1715  piperacillin-tazobactam (ZOSYN) IVPB 3.375 g        3.375 g 100 mL/hr over 30 Minutes Intravenous  Once 08/27/20 1706 08/27/20 1917   08/27/20 1700  valACYclovir (VALTREX) tablet 1,000 mg        1,000 mg Oral Daily 08/27/20 1659     08/27/20 1530  cefTRIAXone (ROCEPHIN) 2 g in sodium chloride 0.9 % 100 mL IVPB       See Hyperspace for full Linked Orders Report.   2 g 200 mL/hr over 30 Minutes Intravenous  Once 08/27/20 1518 08/27/20 1654   08/27/20 1530  metroNIDAZOLE (FLAGYL) IVPB 500 mg       See Hyperspace for full Linked Orders Report.   500 mg 100 mL/hr over 60 Minutes Intravenous  Once 08/27/20 1518 08/27/20 1800        Assessment/Plan Acute Diverticulitis with intramural abscess - Acute diverticulitis at the rectosigmoid junction with possible developing intramural abscess measuring 2.4 x 1.1 cm - She believes this is her first episode of diverticulitis - WBC wnl, afebrile, minimal NT on exam. No indication for emergency surgery. Allow FLD today.  - Cont IV abx - Hopefully will improve with conservative management and be able to avoid surgery during  admission  FEN - FLD VTE - SCDs, okay for chemical prophylaxis from a general surgery standpoint ID - Zosyn 6/16 >>   Hx of UC? - she reports hx of UC dx by Dr. Cristina Gong on Colonoscopy in 2018. Previously on [place] but no longer takes this. Last Colonoscopy in 2020 apparently just showed diverticulosis. I cannot see these results in our system Hx of R hemicolectomy Bilateral hydronephrosis - having some hesitancy. Check UA. Bladder scan PRN HTN HLD Hx of retinitis for methotrexate    LOS: 1 day    Jillyn Ledger , [facility] Surgery 08/28/2020, 10:04 AM Please see Amion for pager number during day hours 7:00am-4:30pm

## 2020-08-28 NOTE — Progress Notes (Signed)
80 year old female on methotrexate sent for admission via ED after outpatient CAT scan performed on 08/27/2020 showed acute diverticulitis at the rectosigmoid junction with intramural abscess measuring 2.4 x 1.1 cm, for IV antibiotics.  Previous procedures with [facility] by Dr. Cristina Gong:  Colonoscopy 11/2018: Changes of right colectomy and diverticulosis in sigmoid Flexible sigmoidoscopy 2018: Proctitis Flexible sigmoidoscopy 2017: Proctitis, possible segmental colitis associated with diverticulitis Colonoscopy 2015: Diverticulosis in sigmoid, ileocolonic anastomosis   Surgical team has been consulted and following.  Discussed with patient, if she shows clinical improvement, plan will be continuation of antibiotics, repeat imaging in a few weeks.  Will arrange for outpatient follow-up with [facility] upon discharge.

## 2020-08-29 DIAGNOSIS — K5720 Diverticulitis of large intestine with perforation and abscess without bleeding: Secondary | ICD-10-CM | POA: Diagnosis not present

## 2020-08-29 LAB — CBC
HCT: 37.1 % (ref 36.0–46.0)
Hemoglobin: 12.4 g/dL (ref 12.0–15.0)
MCH: 34.5 pg — ABNORMAL HIGH (ref 26.0–34.0)
MCHC: 33.4 g/dL (ref 30.0–36.0)
MCV: 103.3 fL — ABNORMAL HIGH (ref 80.0–100.0)
Platelets: 169 10*3/uL (ref 150–400)
RBC: 3.59 MIL/uL — ABNORMAL LOW (ref 3.87–5.11)
RDW: 14.3 % (ref 11.5–15.5)
WBC: 7.6 10*3/uL (ref 4.0–10.5)
nRBC: 0 % (ref 0.0–0.2)

## 2020-08-29 MED ORDER — AMLODIPINE BESYLATE 5 MG PO TABS
5.0000 mg | ORAL_TABLET | Freq: Every day | ORAL | Status: DC
  Administered 2020-08-29 – 2020-08-30 (×2): 5 mg via ORAL
  Filled 2020-08-29 (×2): qty 1

## 2020-08-29 NOTE — Plan of Care (Signed)
  Problem: Activity: Goal: Risk for activity intolerance will decrease Outcome: Progressing   Problem: Nutrition: Goal: Adequate nutrition will be maintained Outcome: Progressing   Problem: Coping: Goal: Level of anxiety will decrease Outcome: Progressing   Problem: Pain Managment: Goal: General experience of comfort will improve Outcome: Progressing   

## 2020-08-29 NOTE — Progress Notes (Signed)
PROGRESS NOTE    [name]  QGB:[id number] DOB: 02-16-1941 DOA: 08/27/2020 PCP: Deland Pretty, MD   Chief Complain:Abd pain  Brief Narrative: [name] is a 80 y.o. female with medical history significant of hypertension, hyperlipidemia, diverticulosis, retinitis who presented to the emergency department after she was referred from her gastroenterologist office .  Patient was having abdominal pain since last few days.  Pain was mainly in the left lower quadrant that was radiating to the mid abdomen.  She was unable to walk or sit and the pain was excruciating and she was unable to defecate or pass urine.  Patient also reported mild grade fever, nausea.CT abdomen/pelvis showed acute diverticulitis at the rectosigmoid junction with possible developing intramural abscess.  General surgery following.   Assessment & Plan:   Principal Problem:   Colonic diverticular abscess Active Problems:   Essential hypertension   Hyperlipidemia  Acute diverticulitis with possible developing intramural abscess: Presented with left lower quadrant pain, nausea, acute fever.  Afebrile on presentation, no leukocytosis.  Continue Zosyn for now.  Continue  antiemetics, pain medications.  General surgery also consulted and following.  Diet advanced to soft Patient has history of diverticulosis and follows with [facility]. If continues to do better, will plan for discharge tomorrow.  Dysuria: Patient complained of dysuria this is most likely from the irritation of the bladder from nearby abscess/diverticulitis.  UA not impressive for UTI.   Hypertension: Current blood pressure stable.  She takes amlodipine, Maxide at home.  Amlodipine restarted.  Hyperlipidemia: Takes Lipitor at home, continued   History of retinitis: Follows with ophthalmology and takes methotrexate every week along with folic acid.   History of osteoarthritis/osteoporosis: Takes calcium and vitamin D supplements at home.             DVT prophylaxis: Lovenox Code Status: Full Family Communication: Daughter at the bedside on 08/28/2020 Status is: Inpatient  Remains inpatient appropriate because:IV treatments appropriate due to intensity of illness or inability to take PO  Dispo: The patient is from: Home              Anticipated d/c is to: Home              Patient currently is not medically stable to d/c.   Difficult to place patient No    Consultants: General surgery  Procedures:None  Antimicrobials:  Anti-infectives (From admission, onward)    Start     Dose/Rate Route Frequency Ordered Stop   08/27/20 2300  piperacillin-tazobactam (ZOSYN) IVPB 3.375 g        3.375 g 12.5 mL/hr over 240 Minutes Intravenous Every 8 hours 08/27/20 1706     08/27/20 1715  piperacillin-tazobactam (ZOSYN) IVPB 3.375 g        3.375 g 100 mL/hr over 30 Minutes Intravenous  Once 08/27/20 1706 08/27/20 1917   08/27/20 1700  valACYclovir (VALTREX) tablet 1,000 mg        1,000 mg Oral Daily 08/27/20 1659     08/27/20 1530  cefTRIAXone (ROCEPHIN) 2 g in sodium chloride 0.9 % 100 mL IVPB       See Hyperspace for full Linked Orders Report.   2 g 200 mL/hr over 30 Minutes Intravenous  Once 08/27/20 1518 08/27/20 1654   08/27/20 1530  metroNIDAZOLE (FLAGYL) IVPB 500 mg       See Hyperspace for full Linked Orders Report.   500 mg 100 mL/hr over 60 Minutes Intravenous  Once 08/27/20 1518 08/27/20 1800  Subjective: Patient seen and examined the bedside this morning.  Hemodynamically stable.  Comfortable.  Abdomen pain is much better today.  No nausea or vomiting.  Objective: Vitals:   08/28/20 0540 08/28/20 1336 08/28/20 2241 08/29/20 0602  BP: 114/77 138/89 130/69 (!) 154/79  Pulse: 98 97 65 (!) 58  Resp: 16 18 16 16   Temp: 97.8 F (36.6 C) 98.7 F (37.1 C) 98.2 F (36.8 C) 97.9 F (36.6 C)  TempSrc:   Oral Oral  SpO2: 95% 96% 95% 97%  Weight:      Height:        Intake/Output Summary (Last 24 hours)  at 08/29/2020 0743 Last data filed at 08/29/2020 0600 Gross per 24 hour  Intake 4008.64 ml  Output --  Net 4008.64 ml   Filed Weights   08/27/20 1453  Weight: 67.6 kg    Examination:  General exam: Overall comfortable, not in distress,pleasant female HEENT: PERRL Respiratory system:  no wheezes or crackles  Cardiovascular system: S1 & S2 heard, RRR.  Gastrointestinal system: Abdomen is nondistended, soft and mostly nontender Central nervous system: Alert and oriented Extremities: No edema, no clubbing ,no cyanosis Skin: No rashes, no ulcers,no icterus      Data Reviewed: I have personally reviewed following labs and imaging studies  CBC: Recent Labs  Lab 08/27/20 1507 08/28/20 0319 08/29/20 0338  WBC 9.8 8.1 7.6  NEUTROABS 7.5  --   --   HGB 13.9 12.8 12.4  HCT 40.8 38.7 37.1  MCV 102.5* 103.8* 103.3*  PLT 183 174 161   Basic Metabolic Panel: Recent Labs  Lab 08/27/20 1507 08/28/20 0319  NA 136 140  K 3.7 3.8  CL 102 105  CO2 26 26  GLUCOSE 102* 82  BUN 8 7*  CREATININE 0.99 0.84  CALCIUM 9.1 8.6*   GFR: Estimated Creatinine Clearance: 50.4 mL/min (by C-G formula based on SCr of 0.84 mg/dL). Liver Function Tests: Recent Labs  Lab 08/27/20 1507  AST 20  ALT 27  ALKPHOS 60  BILITOT 1.0  PROT 6.9  ALBUMIN 3.8   No results for input(s): LIPASE, AMYLASE in the last 168 hours. No results for input(s): AMMONIA in the last 168 hours. Coagulation Profile: No results for input(s): INR, PROTIME in the last 168 hours. Cardiac Enzymes: No results for input(s): CKTOTAL, CKMB, CKMBINDEX, TROPONINI in the last 168 hours. BNP (last 3 results) No results for input(s): PROBNP in the last 8760 hours. HbA1C: No results for input(s): HGBA1C in the last 72 hours. CBG: No results for input(s): GLUCAP in the last 168 hours. Lipid Profile: No results for input(s): CHOL, HDL, LDLCALC, TRIG, CHOLHDL, LDLDIRECT in the last 72 hours. Thyroid Function Tests: No results  for input(s): TSH, T4TOTAL, FREET4, T3FREE, THYROIDAB in the last 72 hours. Anemia Panel: No results for input(s): VITAMINB12, FOLATE, FERRITIN, TIBC, IRON, RETICCTPCT in the last 72 hours. Sepsis Labs: No results for input(s): PROCALCITON, LATICACIDVEN in the last 168 hours.  Recent Results (from the past 240 hour(s))  SARS CORONAVIRUS 2 (TAT 6-24 HRS) Nasopharyngeal Nasopharyngeal Swab     Status: None   Collection Time: 08/27/20  4:29 PM   Specimen: Nasopharyngeal Swab  Result Value Ref Range Status   SARS Coronavirus 2 NEGATIVE NEGATIVE Final    Comment: (NOTE) SARS-CoV-2 target nucleic acids are NOT DETECTED.  The SARS-CoV-2 RNA is generally detectable in upper and lower respiratory specimens during the acute phase of infection. Negative results do not preclude SARS-CoV-2 infection, do not rule  out co-infections with other pathogens, and should not be used as the sole basis for treatment or other patient management decisions. Negative results must be combined with clinical observations, patient history, and epidemiological information. The expected result is Negative.  Fact Sheet for Patients: SugarRoll.be  Fact Sheet for Healthcare Providers: https://www.woods-mathews.com/  This test is not yet approved or cleared by the [place] FDA and  has been authorized for detection and/or diagnosis of SARS-CoV-2 by FDA under an Emergency Use Authorization (EUA). This EUA will remain  in effect (meaning this test can be used) for the duration of the COVID-19 declaration under Se ction 564(b)(1) of the Act, 21 U.S.C. section 360bbb-3(b)(1), unless the authorization is terminated or revoked sooner.  Performed at [facility], [place] [address], [place], [place] [address]          Radiology Studies: CT ABDOMEN PELVIS W CONTRAST  Result Date: 08/27/2020 CLINICAL DATA:  Generalized abdominal pain. EXAM: CT ABDOMEN AND PELVIS WITH  CONTRAST TECHNIQUE: Multidetector CT imaging of the abdomen and pelvis was performed using the standard protocol following bolus administration of intravenous contrast. CONTRAST:  115mL ISOVUE-300 IOPAMIDOL (ISOVUE-300) INJECTION 61% COMPARISON:  None. FINDINGS: Lower chest: Mild bibasilar atelectasis. Lung bases are otherwise clear. Heart size within normal limits. Hepatobiliary: No focal liver abnormality is seen. No gallstones, gallbladder wall thickening, or biliary dilatation. Pancreas: Unremarkable. No pancreatic ductal dilatation or surrounding inflammatory changes. Spleen: Normal in size without focal abnormality. Adrenals/Urinary Tract: Unremarkable adrenal glands. Probable bilateral extrarenal pelvises or possibly mild bilateral hydronephrosis. No renal or ureteral stone identified. Kidneys enhance symmetrically. No focal lesion. Urinary bladder is within normal limits. Stomach/Bowel: Oral contrast is present throughout the bowel. Stomach is within normal limits. No dilated loops of bowel. Short segment of markedly thickened, inflamed bowel at the rectosigmoid junction containing numerous diverticula. Prominent pericolonic fat stranding and trace fluid. Possible developing intramural abscess along the colonic wall at this segment measuring approximately 2.4 x 1.1 cm (series 2, image 64). Otherwise, no organized fluid collection or abscess. No extraluminal air. Numerous diverticula throughout the remaining sigmoid colon. Vascular/Lymphatic: Scattered aortoiliac atherosclerotic calcifications without aneurysm. Circumaortic left renal vein, an anatomic variant. No abdominopelvic lymphadenopathy. Reproductive: Uterus and bilateral adnexa are unremarkable. Other: No pneumoperitoneum.  No abdominal wall hernia. Musculoskeletal: No acute or significant osseous findings. Degenerative disc disease within the lumbar spine. IMPRESSION: 1. Acute diverticulitis at the rectosigmoid junction with possible developing  intramural abscess. 2. Probable bilateral extrarenal pelvises or possibly mild bilateral hydronephrosis. 3. Aortic atherosclerosis (ICD10-I70.0). These results will be called to the ordering clinician or representative by the Radiologist Assistant, and communication documented in the PACS or [employer]. Electronically Signed   By: Davina Poke D.O.   On: 08/27/2020 13:15        Scheduled Meds:  amLODipine  5 mg Oral Daily   atorvastatin  20 mg Oral QHS   dorzolamide-timolol  1 drop Both Eyes BID   enoxaparin (LOVENOX) injection  40 mg Subcutaneous Daily   folic acid  3 mg Oral Daily   valACYclovir  1,000 mg Oral Daily   Continuous Infusions:  sodium chloride 100 mL/hr at 08/28/20 2306   piperacillin-tazobactam (ZOSYN)  IV 3.375 g (08/28/20 2307)     LOS: 2 days    Time spent: 25 mins,More than 50% of that time was spent in counseling and/or coordination of care.      Shelly Coss, MD [facility] P6/18/2022, 7:43 AM

## 2020-08-29 NOTE — Progress Notes (Signed)
Subjective/Chief Complaint: ABDOMINAL PAIN Pt feels better  Wants more to eat  Passed a small amount of blood but no pain    Objective: Vital signs in last 24 hours: Temp:  [97.9 F (36.6 C)-98.7 F (37.1 C)] 97.9 F (36.6 C) (06/18 0602) Pulse Rate:  [58-97] 58 (06/18 0602) Resp:  [16-18] 16 (06/18 0602) BP: (130-154)/(69-89) 154/79 (06/18 0602) SpO2:  [95 %-97 %] 97 % (06/18 0602) Last BM Date: 08/27/20  Intake/Output from previous day: 06/17 0701 - 06/18 0700 In: 4008.6 [P.O.:1740; I.V.:2102.9; IV Piggyback:165.8] Out: -  Intake/Output this shift: Total I/O In: 50 [IV Piggyback:50] Out: -    Gen:  Alert, NAD, pleasant Card:  RRR Pulm:  CTAB, no W/R/R, effort normal Abd: Soft, ND, NON TENDER  Ext:  No LE edema  Psych: A&Ox3 Skin: no rashes noted, warm and dry Lab Results:  Recent Labs    08/28/20 0319 08/29/20 0338  WBC 8.1 7.6  HGB 12.8 12.4  HCT 38.7 37.1  PLT 174 169   BMET Recent Labs    08/27/20 1507 08/28/20 0319  NA 136 140  K 3.7 3.8  CL 102 105  CO2 26 26  GLUCOSE 102* 82  BUN 8 7*  CREATININE 0.99 0.84  CALCIUM 9.1 8.6*   PT/INR No results for input(s): LABPROT, INR in the last 72 hours. ABG No results for input(s): PHART, HCO3 in the last 72 hours.  Invalid input(s): PCO2, PO2  Studies/Results: CT ABDOMEN PELVIS W CONTRAST  Result Date: 08/27/2020 CLINICAL DATA:  Generalized abdominal pain. EXAM: CT ABDOMEN AND PELVIS WITH CONTRAST TECHNIQUE: Multidetector CT imaging of the abdomen and pelvis was performed using the standard protocol following bolus administration of intravenous contrast. CONTRAST:  123mL ISOVUE-300 IOPAMIDOL (ISOVUE-300) INJECTION 61% COMPARISON:  None. FINDINGS: Lower chest: Mild bibasilar atelectasis. Lung bases are otherwise clear. Heart size within normal limits. Hepatobiliary: No focal liver abnormality is seen. No gallstones, gallbladder wall thickening, or biliary dilatation. Pancreas: Unremarkable. No  pancreatic ductal dilatation or surrounding inflammatory changes. Spleen: Normal in size without focal abnormality. Adrenals/Urinary Tract: Unremarkable adrenal glands. Probable bilateral extrarenal pelvises or possibly mild bilateral hydronephrosis. No renal or ureteral stone identified. Kidneys enhance symmetrically. No focal lesion. Urinary bladder is within normal limits. Stomach/Bowel: Oral contrast is present throughout the bowel. Stomach is within normal limits. No dilated loops of bowel. Short segment of markedly thickened, inflamed bowel at the rectosigmoid junction containing numerous diverticula. Prominent pericolonic fat stranding and trace fluid. Possible developing intramural abscess along the colonic wall at this segment measuring approximately 2.4 x 1.1 cm (series 2, image 64). Otherwise, no organized fluid collection or abscess. No extraluminal air. Numerous diverticula throughout the remaining sigmoid colon. Vascular/Lymphatic: Scattered aortoiliac atherosclerotic calcifications without aneurysm. Circumaortic left renal vein, an anatomic variant. No abdominopelvic lymphadenopathy. Reproductive: Uterus and bilateral adnexa are unremarkable. Other: No pneumoperitoneum.  No abdominal wall hernia. Musculoskeletal: No acute or significant osseous findings. Degenerative disc disease within the lumbar spine. IMPRESSION: 1. Acute diverticulitis at the rectosigmoid junction with possible developing intramural abscess. 2. Probable bilateral extrarenal pelvises or possibly mild bilateral hydronephrosis. 3. Aortic atherosclerosis (ICD10-I70.0). These results will be called to the ordering clinician or representative by the Radiologist Assistant, and communication documented in the PACS or [employer]. Electronically Signed   By: Davina Poke D.O.   On: 08/27/2020 13:15    Anti-infectives: Anti-infectives (From admission, onward)    Start     Dose/Rate Route Frequency Ordered Stop   08/27/20  2300   piperacillin-tazobactam (ZOSYN) IVPB 3.375 g        3.375 g 12.5 mL/hr over 240 Minutes Intravenous Every 8 hours 08/27/20 1706     08/27/20 1715  piperacillin-tazobactam (ZOSYN) IVPB 3.375 g        3.375 g 100 mL/hr over 30 Minutes Intravenous  Once 08/27/20 1706 08/27/20 1917   08/27/20 1700  valACYclovir (VALTREX) tablet 1,000 mg        1,000 mg Oral Daily 08/27/20 1659     08/27/20 1530  cefTRIAXone (ROCEPHIN) 2 g in sodium chloride 0.9 % 100 mL IVPB       See Hyperspace for full Linked Orders Report.   2 g 200 mL/hr over 30 Minutes Intravenous  Once 08/27/20 1518 08/27/20 1654   08/27/20 1530  metroNIDAZOLE (FLAGYL) IVPB 500 mg       See Hyperspace for full Linked Orders Report.   500 mg 100 mL/hr over 60 Minutes Intravenous  Once 08/27/20 1518 08/27/20 1800       Assessment/Plan:  Acute Diverticulitis with intramural abscess - Acute diverticulitis at the rectosigmoid junction with possible developing intramural abscess measuring 2.4 x 1.1 cm - She believes this is her first episode of diverticulitis - WBC wnl, afebrile, minimal NT on exam. No indication for emergency surgery. Allow soft today. - Cont IV abx - H MUCH IMPROVED  POSSIBLE DISCHARGE Sunday ON PO ABX OR Monday   FEN - FLD VTE - SCDs, okay for chemical prophylaxis from a general surgery standpoint ID - Zosyn 6/16 >>    Hx of UC? - she reports hx of UC dx by Dr. Cristina Gong on Colonoscopy in 2018. Previously on Meslamine but no longer takes this. Last Colonoscopy in 2020 apparently just showed diverticulosis. I cannot see these results in our system Hx of R hemicolectomy Bilateral hydronephrosis - having some hesitancy. Check UA. Bladder scan PRN HTN HLD Hx of retinitis for methotrexate  LOS: 2 days    Turner Daniels MD  08/29/2020

## 2020-08-29 NOTE — Plan of Care (Signed)
  Problem: Nutrition: Goal: Adequate nutrition will be maintained Outcome: Progressing   Problem: Coping: Goal: Level of anxiety will decrease Outcome: Progressing   Problem: Pain Managment: Goal: General experience of comfort will improve Outcome: Progressing   Problem: Safety: Goal: Ability to remain free from injury will improve Outcome: Progressing   

## 2020-08-29 NOTE — Plan of Care (Signed)
  Problem: Education: Goal: Knowledge of General Education information will improve Description Including pain rating scale, medication(s)/side effects and non-pharmacologic comfort measures Outcome: Progressing   Problem: Health Behavior/Discharge Planning: Goal: Ability to manage health-related needs will improve Outcome: Progressing   

## 2020-08-30 DIAGNOSIS — K5720 Diverticulitis of large intestine with perforation and abscess without bleeding: Secondary | ICD-10-CM | POA: Diagnosis not present

## 2020-08-30 MED ORDER — AMOXICILLIN-POT CLAVULANATE 875-125 MG PO TABS: 1.0000 | ORAL_TABLET | Freq: Two times a day (BID) | ORAL | 0 refills | Status: AC

## 2020-08-30 MED ORDER — AMOXICILLIN-POT CLAVULANATE 875-125 MG PO TABS
1.0000 | ORAL_TABLET | Freq: Two times a day (BID) | ORAL | Status: DC
  Administered 2020-08-30: 1 via ORAL
  Filled 2020-08-30: qty 1

## 2020-08-30 NOTE — Plan of Care (Signed)
  Problem: Education: Goal: Knowledge of General Education information will improve Description: Including pain rating scale, medication(s)/side effects and non-pharmacologic comfort measures Outcome: Progressing   Problem: Health Behavior/Discharge Planning: Goal: Ability to manage health-related needs will improve Outcome: Progressing   Problem: Pain Managment: Goal: General experience of comfort will improve Outcome: Progressing   

## 2020-08-30 NOTE — Discharge Summary (Signed)
Physician Discharge Summary  [name] HAL:[id number] DOB: [birth year] DOA: 08/27/2020  PCP: Deland Pretty, MD  Admit date: 08/27/2020 Discharge date: 08/30/2020  Admitted From: Home Disposition:  Home  Discharge Condition:Stable CODE STATUS:FULL, Diet recommendation: Soft food for next 1-2 days   Brief/Interim Summary:  [name] is a 80 y.o. female with medical history significant of hypertension, hyperlipidemia, diverticulosis, retinitis who presented to the emergency department after she was referred from her gastroenterologist office .  Patient was having abdominal pain since last few days.  Pain was mainly in the left lower quadrant that was radiating to the mid abdomen.  She was unable to walk or sit and the pain was excruciating and she was unable to defecate or pass urine.  Patient also reported mild grade fever, nausea.CT abdomen/pelvis showed acute diverticulitis at the rectosigmoid junction with possible developing intramural abscess.  General surgery were following.  Patient's overall status gradually improved with conservative management.  Her abdomen pain, nausea have resolved.  She is tolerating solid food.  General surgery cleared her for discharge.  She will be discharged on Augmentin for a week.  Following problems were addressed during hospitalization:  Acute diverticulitis with possible developing intramural abscess: Presented with left lower quadrant pain, nausea,fever.  Afebrile on presentation, no leukocytosis. Started on Zosyn .Patient has history of diverticulosis and follows with [facility]. General surgery were following.  Patient's overall status gradually improved with conservative management.  Her abdomen pain, nausea have resolved.  She is tolerating solid food.  General surgery cleared her for discharge.  She will be discharged on Augmentin for a week.   Dysuria: Patient complained of dysuria this is most likely from the irritation of the bladder from  nearby abscess/diverticulitis.  UA not impressive for UTI.   Hypertension: Current blood pressure stable.  She takes amlodipine, Maxide at home.     Hyperlipidemia: Takes Lipitor at home, continued   History of retinitis: Follows with ophthalmology and takes methotrexate every week along with folic acid.   History of osteoarthritis/osteoporosis: Takes calcium and vitamin D supplements at home.     Discharge Diagnoses:  Principal Problem:   Colonic diverticular abscess Active Problems:   Essential hypertension   Hyperlipidemia    Discharge Instructions  Discharge Instructions     Diet general   Complete by: As directed    Soft for next 1-2 days   Discharge instructions   Complete by: As directed    1)Please take prescribed medications as instructed 2)Follow up with your PCP in a week.  Follow-up with your gastroenterologist as an outpatient 3)Take soft diet for next 1-2 days then start having regular food.  Take high-fiber diet in the future.   Increase activity slowly   Complete by: As directed       Allergies as of 08/30/2020       Reactions   Abreva [docosanol] Swelling, Other (See Comments)   Top lip and side of face swollen   Codeine Nausea Only   Fosamax [alendronate]    Other reaction(s): chest pain        Medication List     TAKE these medications    acetaminophen 500 MG tablet Commonly known as: TYLENOL Take 1,000 mg by mouth every 6 (six) hours as needed for headache.   amoxicillin-clavulanate 875-125 MG tablet Commonly known as: AUGMENTIN Take 1 tablet by mouth every 12 (twelve) hours for 7 days.   atorvastatin 20 MG tablet Commonly known as: LIPITOR Take 20 mg  by mouth at bedtime.   CALCIUM CARBONATE-VITAMIN D PO Take 400 mg by mouth 3 (three) times daily.   cholecalciferol 1000 units tablet Commonly known as: VITAMIN D Take 1,000 Units by mouth daily.   dorzolamide-timolol 22.3-6.8 MG/ML ophthalmic solution Commonly known as:  COSOPT Place 1 drop into both eyes 2 (two) times daily.   folic acid 1 MG tablet Commonly known as: FOLVITE Take 3 mg by mouth daily.   ICAPS MV PO Take 1 tablet by mouth 2 (two) times daily.   meclizine 25 MG tablet Commonly known as: ANTIVERT Take 1 tablet (25 mg total) by mouth 2 (two) times daily.   methotrexate 2.5 MG tablet Commonly known as: RHEUMATREX Take 12.5 mg by mouth See admin instructions. Caution:Chemotherapy. Protect from light. Takes 12.5 mg two times a day once a week on Saturday.   Nurtec 75 MG Tbdp Generic drug: Rimegepant Sulfate Take 75 mg by mouth daily as needed (migraine).   Polyethyl Glycol-Propyl Glycol 0.4-0.3 % Soln Place 1 drop into the right eye as needed (For dry eyes.).   rizatriptan 10 MG disintegrating tablet Commonly known as: MAXALT-MLT Take 1 tablet by mouth daily as needed for migraine.   SYSTANE BALANCE OP Place 1 drop into both eyes daily as needed (dry eyes).   triamterene-hydrochlorothiazide 37.5-25 MG tablet Commonly known as: MAXZIDE-25 Take 0.5 tablets by mouth every other day.   valACYclovir 1000 MG tablet Commonly known as: VALTREX Take 1,000 mg by mouth daily.       ASK your doctor about these medications    amLODipine 5 MG tablet Commonly known as: NORVASC TAKE 1 TABLET EACH DAY.        Follow-up Information     Deland Pretty, MD. Schedule an appointment as soon as possible for a visit in 1 week(s).   Specialty: Internal Medicine Contact information: [place] [address] [phone]                Allergies  Allergen Reactions   Abreva [Docosanol] Swelling and Other (See Comments)    Top lip and side of face swollen   Codeine Nausea Only   Fosamax [Alendronate]     Other reaction(s): chest pain    Consultations: Surgery   Procedures/Studies: CT ABDOMEN PELVIS W CONTRAST  Result Date: 08/27/2020 CLINICAL DATA:  Generalized abdominal pain. EXAM: CT  ABDOMEN AND PELVIS WITH CONTRAST TECHNIQUE: Multidetector CT imaging of the abdomen and pelvis was performed using the standard protocol following bolus administration of intravenous contrast. CONTRAST:  148mL ISOVUE-300 IOPAMIDOL (ISOVUE-300) INJECTION 61% COMPARISON:  None. FINDINGS: Lower chest: Mild bibasilar atelectasis. Lung bases are otherwise clear. Heart size within normal limits. Hepatobiliary: No focal liver abnormality is seen. No gallstones, gallbladder wall thickening, or biliary dilatation. Pancreas: Unremarkable. No pancreatic ductal dilatation or surrounding inflammatory changes. Spleen: Normal in size without focal abnormality. Adrenals/Urinary Tract: Unremarkable adrenal glands. Probable bilateral extrarenal pelvises or possibly mild bilateral hydronephrosis. No renal or ureteral stone identified. Kidneys enhance symmetrically. No focal lesion. Urinary bladder is within normal limits. Stomach/Bowel: Oral contrast is present throughout the bowel. Stomach is within normal limits. No dilated loops of bowel. Short segment of markedly thickened, inflamed bowel at the rectosigmoid junction containing numerous diverticula. Prominent pericolonic fat stranding and trace fluid. Possible developing intramural abscess along the colonic wall at this segment measuring approximately 2.4 x 1.1 cm (series 2, image 64). Otherwise, no organized fluid collection or abscess. No extraluminal air. Numerous diverticula throughout the  remaining sigmoid colon. Vascular/Lymphatic: Scattered aortoiliac atherosclerotic calcifications without aneurysm. Circumaortic left renal vein, an anatomic variant. No abdominopelvic lymphadenopathy. Reproductive: Uterus and bilateral adnexa are unremarkable. Other: No pneumoperitoneum.  No abdominal wall hernia. Musculoskeletal: No acute or significant osseous findings. Degenerative disc disease within the lumbar spine. IMPRESSION: 1. Acute diverticulitis at the rectosigmoid junction with  possible developing intramural abscess. 2. Probable bilateral extrarenal pelvises or possibly mild bilateral hydronephrosis. 3. Aortic atherosclerosis (ICD10-I70.0). These results will be called to the ordering clinician or representative by the Radiologist Assistant, and communication documented in the PACS or [employer]. Electronically Signed   By: Davina Poke D.O.   On: 08/27/2020 13:15      Subjective:  Patient seen and examined the bedside this morning.  Hemodynamically stable for discharge today.  I called the daughter and discussed about the discharge planning.   Discharge Exam: Vitals:   08/29/20 2122 08/30/20 0709  BP: 127/72 129/71  Pulse: 61 (!) 58  Resp: 16 16  Temp: 97.8 F (36.6 C) 97.9 F (36.6 C)  SpO2: 96% 96%   Vitals:   08/29/20 0602 08/29/20 1401 08/29/20 2122 08/30/20 0709  BP: (!) 154/79 128/70 127/72 129/71  Pulse: (!) 58 71 61 (!) 58  Resp: 16 18 16 16   Temp: 97.9 F (36.6 C) 98 F (36.7 C) 97.8 F (36.6 C) 97.9 F (36.6 C)  TempSrc: Oral Oral Oral Oral  SpO2: 97% 98% 96% 96%  Weight:      Height:        General: Pt is alert, awake, not in acute distress Cardiovascular: RRR, S1/S2 +, no rubs, no gallops Respiratory: CTA bilaterally, no wheezing, no rhonchi Abdominal: Soft, NT, ND, bowel sounds + Extremities: no edema, no cyanosis    The results of significant diagnostics from this hospitalization (including imaging, microbiology, ancillary and laboratory) are listed below for reference.     Microbiology: Recent Results (from the past 240 hour(s))  SARS CORONAVIRUS 2 (TAT 6-24 HRS) Nasopharyngeal Nasopharyngeal Swab     Status: None   Collection Time: 08/27/20  4:29 PM   Specimen: Nasopharyngeal Swab  Result Value Ref Range Status   SARS Coronavirus 2 NEGATIVE NEGATIVE Final    Comment: (NOTE) SARS-CoV-2 target nucleic acids are NOT DETECTED.  The SARS-CoV-2 RNA is generally detectable in upper and lower respiratory specimens  during the acute phase of infection. Negative results do not preclude SARS-CoV-2 infection, do not rule out co-infections with other pathogens, and should not be used as the sole basis for treatment or other patient management decisions. Negative results must be combined with clinical observations, patient history, and epidemiological information. The expected result is Negative.  Fact Sheet for Patients: SugarRoll.be  Fact Sheet for Healthcare Providers: https://www.woods-mathews.com/  This test is not yet approved or cleared by the [place] FDA and  has been authorized for detection and/or diagnosis of SARS-CoV-2 by FDA under an Emergency Use Authorization (EUA). This EUA will remain  in effect (meaning this test can be used) for the duration of the COVID-19 declaration under Se ction 564(b)(1) of the Act, 21 U.S.C. section 360bbb-3(b)(1), unless the authorization is terminated or revoked sooner.  Performed at [facility], [place] [address], [place], [place] [address]      Labs: BNP (last 3 results) No results for input(s): BNP in the last 8760 hours. Basic Metabolic Panel: Recent Labs  Lab 08/27/20 1507 08/28/20 0319  NA 136 140  K 3.7 3.8  CL 102 105  CO2 26 26  GLUCOSE 102* 82  BUN 8 7*  CREATININE 0.99 0.84  CALCIUM 9.1 8.6*   Liver Function Tests: Recent Labs  Lab 08/27/20 1507  AST 20  ALT 27  ALKPHOS 60  BILITOT 1.0  PROT 6.9  ALBUMIN 3.8   No results for input(s): LIPASE, AMYLASE in the last 168 hours. No results for input(s): AMMONIA in the last 168 hours. CBC: Recent Labs  Lab 08/27/20 1507 08/28/20 0319 08/29/20 0338  WBC 9.8 8.1 7.6  NEUTROABS 7.5  --   --   HGB 13.9 12.8 12.4  HCT 40.8 38.7 37.1  MCV 102.5* 103.8* 103.3*  PLT 183 174 169   Cardiac Enzymes: No results for input(s): CKTOTAL, CKMB, CKMBINDEX, TROPONINI in the last 168 hours. BNP: Invalid input(s): POCBNP CBG: No  results for input(s): GLUCAP in the last 168 hours. D-Dimer No results for input(s): DDIMER in the last 72 hours. Hgb A1c No results for input(s): HGBA1C in the last 72 hours. Lipid Profile No results for input(s): CHOL, HDL, LDLCALC, TRIG, CHOLHDL, LDLDIRECT in the last 72 hours. Thyroid function studies No results for input(s): TSH, T4TOTAL, T3FREE, THYROIDAB in the last 72 hours.  Invalid input(s): FREET3 Anemia work up No results for input(s): VITAMINB12, FOLATE, FERRITIN, TIBC, IRON, RETICCTPCT in the last 72 hours. Urinalysis    Component Value Date/Time   COLORURINE STRAW (A) 08/28/2020 1218   APPEARANCEUR CLEAR 08/28/2020 1218   LABSPEC 1.005 08/28/2020 1218   PHURINE 6.0 08/28/2020 [place] 08/28/2020 1218   HGBUR NEGATIVE 08/28/2020 [place] 08/28/2020 1218   KETONESUR 80 (A) 08/28/2020 1218   PROTEINUR NEGATIVE 08/28/2020 1218   UROBILINOGEN 0.2 10/28/2008 1055   NITRITE NEGATIVE 08/28/2020 [place] 08/28/2020 1218   Sepsis Labs Invalid input(s): PROCALCITONIN,  WBC,  LACTICIDVEN Microbiology Recent Results (from the past 240 hour(s))  SARS CORONAVIRUS 2 (TAT 6-24 HRS) Nasopharyngeal Nasopharyngeal Swab     Status: None   Collection Time: 08/27/20  4:29 PM   Specimen: Nasopharyngeal Swab  Result Value Ref Range Status   SARS Coronavirus 2 NEGATIVE NEGATIVE Final    Comment: (NOTE) SARS-CoV-2 target nucleic acids are NOT DETECTED.  The SARS-CoV-2 RNA is generally detectable in upper and lower respiratory specimens during the acute phase of infection. Negative results do not preclude SARS-CoV-2 infection, do not rule out co-infections with other pathogens, and should not be used as the sole basis for treatment or other patient management decisions. Negative results must be combined with clinical observations, patient history, and epidemiological information. The expected result is Negative.  Fact Sheet  for Patients: SugarRoll.be  Fact Sheet for Healthcare Providers: https://www.woods-mathews.com/  This test is not yet approved or cleared by the [place] FDA and  has been authorized for detection and/or diagnosis of SARS-CoV-2 by FDA under an Emergency Use Authorization (EUA). This EUA will remain  in effect (meaning this test can be used) for the duration of the COVID-19 declaration under Se ction 564(b)(1) of the Act, 21 U.S.C. section 360bbb-3(b)(1), unless the authorization is terminated or revoked sooner.  Performed at [facility], [place] [address] [place], [place] [address]     Please note: You were cared for by a hospitalist during your hospital stay. Once you are discharged, your primary care physician will handle any further medical issues. Please note that NO REFILLS for any discharge medications will be authorized once you are discharged, as it is  imperative that you return to your primary care physician (or establish a relationship with a primary care physician if you do not have one) for your post hospital discharge needs so that they can reassess your need for medications and monitor your lab values.    Time coordinating discharge: 40 minutes  SIGNED:   Shelly Coss, MD  [facility] 08/30/2020, 9:54 AM Pager [phone]  If 7PM-7AM, please contact night-coverage [email or web address] Password [email or web address]

## 2020-08-30 NOTE — Progress Notes (Signed)
Subjective/Chief Complaint: Pt feels better pain almost resolved  Bowels moving     Objective: Vital signs in last 24 hours: Temp:  [97.8 F (36.6 C)-98 F (36.7 C)] 97.9 F (36.6 C) (06/19 0709) Pulse Rate:  [58-71] 58 (06/19 0709) Resp:  [16-18] 16 (06/19 0709) BP: (127-129)/(70-72) 129/71 (06/19 0709) SpO2:  [96 %-98 %] 96 % (06/19 0709) Last BM Date: 08/30/20  Intake/Output from previous day: 06/18 0701 - 06/19 0700 In: 847.4 [P.O.:600; IV Piggyback:247.4] Out: -  Intake/Output this shift: No intake/output data recorded.  GI: soft minimal LLQ pain to palpation no mass no rebound or guarding   Lab Results:  Recent Labs    08/28/20 0319 08/29/20 0338  WBC 8.1 7.6  HGB 12.8 12.4  HCT 38.7 37.1  PLT 174 169   BMET Recent Labs    08/27/20 1507 08/28/20 0319  NA 136 140  K 3.7 3.8  CL 102 105  CO2 26 26  GLUCOSE 102* 82  BUN 8 7*  CREATININE 0.99 0.84  CALCIUM 9.1 8.6*   PT/INR No results for input(s): LABPROT, INR in the last 72 hours. ABG No results for input(s): PHART, HCO3 in the last 72 hours.  Invalid input(s): PCO2, PO2  Studies/Results: No results found.  Anti-infectives: Anti-infectives (From admission, onward)    Start     Dose/Rate Route Frequency Ordered Stop   08/27/20 2300  piperacillin-tazobactam (ZOSYN) IVPB 3.375 g        3.375 g 12.5 mL/hr over 240 Minutes Intravenous Every 8 hours 08/27/20 1706     08/27/20 1715  piperacillin-tazobactam (ZOSYN) IVPB 3.375 g        3.375 g 100 mL/hr over 30 Minutes Intravenous  Once 08/27/20 1706 08/27/20 1917   08/27/20 1700  valACYclovir (VALTREX) tablet 1,000 mg        1,000 mg Oral Daily 08/27/20 1659     08/27/20 1530  cefTRIAXone (ROCEPHIN) 2 g in sodium chloride 0.9 % 100 mL IVPB       See Hyperspace for full Linked Orders Report.   2 g 200 mL/hr over 30 Minutes Intravenous  Once 08/27/20 1518 08/27/20 1654   08/27/20 1530  metroNIDAZOLE (FLAGYL) IVPB 500 mg       See  Hyperspace for full Linked Orders Report.   500 mg 100 mL/hr over 60 Minutes Intravenous  Once 08/27/20 1518 08/27/20 1800       Assessment/Plan:     Acute Diverticulitis with intramural abscess - Acute diverticulitis at the rectosigmoid junction with possible developing intramural abscess measuring 2.4 x 1.1 cm - She believes this is her first episode of diverticulitis - WBC wnl, afebrile, minimal NT on exam. No indication for emergency surgery. Allow soft today. - PO ABX   MUCH IMPROVED POSSIBLE DISCHARGE Sunday ON PO ABX OR Monday   Augmentin 875 mg po bid for 7 days  Colonoscopy 2020 UPD Initial episode so no surgery follow up necessary No need to repeat CT unless her symptoms return / worsen Can follow up with PCP   Surgery to sign off  FEN - FLD VTE - SCDs, okay for chemical prophylaxis from a general surgery standpoint ID - Zosyn 6/16 >>    Hx of UC? - she reports hx of UC dx by Dr. Cristina Gong on Colonoscopy in 2018. Previously on Meslamine but no longer takes this. Last Colonoscopy in 2020 apparently just showed diverticulosis. I cannot see these results in our system Hx of R hemicolectomy Bilateral hydronephrosis -  having some hesitancy. Check UA. Bladder scan PRN HTN HLD Hx of retinitis for methotrexate  LOS: 3 days    Joyice Faster Jamese Trauger 08/30/2020

## 2020-08-30 NOTE — Progress Notes (Signed)
The patient is alert and oriented and has been seen by her physician. The orders for discharge were written. IV has been removed. Went over discharge instructions with patient. She is being discharged via wheelchair with all of her belongings.   

## 2020-09-02 LAB — CULTURE, BLOOD (ROUTINE X 2)
Culture: NO GROWTH
Culture: NO GROWTH
Special Requests: ADEQUATE
Special Requests: ADEQUATE

## 2020-09-07 DIAGNOSIS — Z7982 Long term (current) use of aspirin: Secondary | ICD-10-CM | POA: Diagnosis not present

## 2020-09-07 DIAGNOSIS — K5792 Diverticulitis of intestine, part unspecified, without perforation or abscess without bleeding: Secondary | ICD-10-CM | POA: Diagnosis not present

## 2020-09-07 DIAGNOSIS — Z86010 Personal history of colonic polyps: Secondary | ICD-10-CM | POA: Diagnosis not present

## 2020-09-07 DIAGNOSIS — I700 Atherosclerosis of aorta: Secondary | ICD-10-CM | POA: Diagnosis not present

## 2020-09-15 DIAGNOSIS — Z961 Presence of intraocular lens: Secondary | ICD-10-CM | POA: Diagnosis not present

## 2020-09-15 DIAGNOSIS — H209 Unspecified iridocyclitis: Secondary | ICD-10-CM | POA: Diagnosis not present

## 2020-09-15 DIAGNOSIS — H35372 Puckering of macula, left eye: Secondary | ICD-10-CM | POA: Diagnosis not present

## 2020-09-15 DIAGNOSIS — H4052X2 Glaucoma secondary to other eye disorders, left eye, moderate stage: Secondary | ICD-10-CM | POA: Diagnosis not present

## 2020-09-15 DIAGNOSIS — Z85828 Personal history of other malignant neoplasm of skin: Secondary | ICD-10-CM | POA: Diagnosis not present

## 2020-09-15 DIAGNOSIS — Z79899 Other long term (current) drug therapy: Secondary | ICD-10-CM | POA: Diagnosis not present

## 2020-09-25 DIAGNOSIS — Z8719 Personal history of other diseases of the digestive system: Secondary | ICD-10-CM | POA: Diagnosis not present

## 2020-09-25 DIAGNOSIS — K5900 Constipation, unspecified: Secondary | ICD-10-CM | POA: Diagnosis not present

## 2020-10-08 DIAGNOSIS — H4052X2 Glaucoma secondary to other eye disorders, left eye, moderate stage: Secondary | ICD-10-CM | POA: Diagnosis not present

## 2020-10-08 DIAGNOSIS — H40021 Open angle with borderline findings, high risk, right eye: Secondary | ICD-10-CM | POA: Diagnosis not present

## 2020-11-02 DIAGNOSIS — R933 Abnormal findings on diagnostic imaging of other parts of digestive tract: Secondary | ICD-10-CM | POA: Diagnosis not present

## 2020-11-02 DIAGNOSIS — K5732 Diverticulitis of large intestine without perforation or abscess without bleeding: Secondary | ICD-10-CM | POA: Diagnosis not present

## 2020-11-06 DIAGNOSIS — E7801 Familial hypercholesterolemia: Secondary | ICD-10-CM | POA: Diagnosis not present

## 2020-11-06 DIAGNOSIS — I10 Essential (primary) hypertension: Secondary | ICD-10-CM | POA: Diagnosis not present

## 2020-11-10 DIAGNOSIS — I700 Atherosclerosis of aorta: Secondary | ICD-10-CM | POA: Diagnosis not present

## 2020-11-10 DIAGNOSIS — K219 Gastro-esophageal reflux disease without esophagitis: Secondary | ICD-10-CM | POA: Diagnosis not present

## 2020-11-10 DIAGNOSIS — I10 Essential (primary) hypertension: Secondary | ICD-10-CM | POA: Diagnosis not present

## 2020-11-10 DIAGNOSIS — K5130 Ulcerative (chronic) rectosigmoiditis without complications: Secondary | ICD-10-CM | POA: Diagnosis not present

## 2020-11-10 DIAGNOSIS — G43909 Migraine, unspecified, not intractable, without status migrainosus: Secondary | ICD-10-CM | POA: Diagnosis not present

## 2020-11-10 DIAGNOSIS — Z0000 Encounter for general adult medical examination without abnormal findings: Secondary | ICD-10-CM | POA: Diagnosis not present

## 2020-11-10 DIAGNOSIS — M810 Age-related osteoporosis without current pathological fracture: Secondary | ICD-10-CM | POA: Diagnosis not present

## 2020-11-25 ENCOUNTER — Other Ambulatory Visit: Payer: Self-pay

## 2020-11-25 ENCOUNTER — Emergency Department (HOSPITAL_COMMUNITY)
Admission: EM | Admit: 2020-11-25 | Discharge: 2020-11-26 | Disposition: A | Discharge status: Home / Self Care | Payer: Medicare Other | Attending: Emergency Medicine | Admitting: Emergency Medicine

## 2020-11-25 ENCOUNTER — Emergency Department (HOSPITAL_COMMUNITY): Payer: Medicare Other

## 2020-11-25 DIAGNOSIS — R002 Palpitations: Secondary | ICD-10-CM

## 2020-11-25 DIAGNOSIS — R0789 Other chest pain: Secondary | ICD-10-CM | POA: Diagnosis not present

## 2020-11-25 DIAGNOSIS — R0902 Hypoxemia: Secondary | ICD-10-CM | POA: Diagnosis not present

## 2020-11-25 DIAGNOSIS — R079 Chest pain, unspecified: Secondary | ICD-10-CM | POA: Diagnosis not present

## 2020-11-25 DIAGNOSIS — I10 Essential (primary) hypertension: Secondary | ICD-10-CM | POA: Diagnosis not present

## 2020-11-25 DIAGNOSIS — Z79899 Other long term (current) drug therapy: Secondary | ICD-10-CM | POA: Insufficient documentation

## 2020-11-25 DIAGNOSIS — Z8249 Family history of ischemic heart disease and other diseases of the circulatory system: Secondary | ICD-10-CM | POA: Diagnosis not present

## 2020-11-25 DIAGNOSIS — I700 Atherosclerosis of aorta: Secondary | ICD-10-CM | POA: Diagnosis not present

## 2020-11-25 LAB — COMPREHENSIVE METABOLIC PANEL
ALT: 32 U/L (ref 0–44)
AST: 25 U/L (ref 15–41)
Albumin: 4 g/dL (ref 3.5–5.0)
Alkaline Phosphatase: 79 U/L (ref 38–126)
Anion gap: 9 (ref 5–15)
BUN: 12 mg/dL (ref 8–23)
CO2: 25 mmol/L (ref 22–32)
Calcium: 9.6 mg/dL (ref 8.9–10.3)
Chloride: 103 mmol/L (ref 98–111)
Creatinine, Ser: 0.86 mg/dL (ref 0.44–1.00)
GFR, Estimated: >60 mL/min (ref >60)
Glucose, Bld: 114 mg/dL — ABNORMAL HIGH (ref 70–99)
Potassium: 4 mmol/L (ref 3.5–5.1)
Sodium: 137 mmol/L (ref 135–145)
Total Bilirubin: 1 mg/dL (ref 0.3–1.2)
Total Protein: 6.7 g/dL (ref 6.5–8.1)

## 2020-11-25 LAB — CBC WITH DIFFERENTIAL/PLATELET
Abs Immature Granulocytes: 0.03 10*3/uL (ref 0.00–0.07)
Basophils Absolute: 0.1 10*3/uL (ref 0.0–0.1)
Basophils Relative: 1 %
Eosinophils Absolute: 0.3 10*3/uL (ref 0.0–0.5)
Eosinophils Relative: 3 %
HCT: 45.2 % (ref 36.0–46.0)
Hemoglobin: 15.1 g/dL — ABNORMAL HIGH (ref 12.0–15.0)
Immature Granulocytes: 0 %
Lymphocytes Relative: 16 %
Lymphs Abs: 1.3 10*3/uL (ref 0.7–4.0)
MCH: 34.2 pg — ABNORMAL HIGH (ref 26.0–34.0)
MCHC: 33.4 g/dL (ref 30.0–36.0)
MCV: 102.5 fL — ABNORMAL HIGH (ref 80.0–100.0)
Monocytes Absolute: 0.5 10*3/uL (ref 0.1–1.0)
Monocytes Relative: 7 %
Neutro Abs: 5.7 10*3/uL (ref 1.7–7.7)
Neutrophils Relative %: 73 %
Platelets: 224 10*3/uL (ref 150–400)
RBC: 4.41 MIL/uL (ref 3.87–5.11)
RDW: 13.7 % (ref 11.5–15.5)
WBC: 7.9 10*3/uL (ref 4.0–10.5)
nRBC: 0 % (ref 0.0–0.2)

## 2020-11-25 LAB — D-DIMER, QUANTITATIVE: D-Dimer, Quant: <0.27 ug/mL-FEU (ref 0.00–0.50)

## 2020-11-25 LAB — TROPONIN I (HIGH SENSITIVITY)
Troponin I (High Sensitivity): 4 ng/L
Troponin I (High Sensitivity): 5 ng/L

## 2020-11-25 NOTE — ED Provider Notes (Signed)
Emergency Medicine Provider Triage Evaluation Note  [name] , a 80 y.o. female  was evaluated in triage.  Pt complains of chest pain.  Chest pain has been constant over the last 3 days.  Pain is midsternal described as a pressure.  Pain is worse with exertion.  Patient also endorses mild dizziness and shortness of breath with exertion.  Patient pain unchanged with 324 mg aspirin and 2 nitroglycerin.  Review of Systems  Positive: Chest pain, shortness of breath, dizziness Negative: Fever, chills, rhinorrhea, nasal congestion, cough, palpitations, leg swelling or tenderness  Physical Exam  BP (!) 159/79 (BP Location: Left Arm)   Pulse 66   Temp 98.9 F (37.2 C) (Oral)   Resp 16   SpO2 97%  Gen:   Awake, no distress   Resp:  Normal effort, lungs clear to auscultation bilaterally MSK:   Moves extremities without difficulty; no swelling or tenderness to bilateral lower extremities Other:  +2 radial pulse bilaterally  Medical Decision Making  Medically screening exam initiated at 1:32 PM.  Appropriate orders placed.  [name] was informed that the remainder of the evaluation will be completed by another provider, this initial triage assessment does not replace that evaluation, and the importance of remaining in the ED until their evaluation is complete.  The patient appears stable so that the remainder of the work up may be completed by another provider.      Loni Beckwith, PA-C 11/25/20 1333    Elnora Morrison, MD 11/26/20 [phone]

## 2020-11-25 NOTE — ED Triage Notes (Addendum)
Pt from PCP office for eval of CP x 3 days. Chest pain unchanged with 324 ASA and 2 nitro. Pain worse with exertion and also endorses some mild dizziness and shob, feeling like she cannot take a deep breath.

## 2020-11-26 NOTE — ED Provider Notes (Signed)
[facility] EMERGENCY DEPARTMENT Provider Note   CSN: [id number] Arrival date & time: 11/25/20  1315     History Chief Complaint  Patient presents with   Chest Pain   Hypertension    [name] is a 80 y.o. female.   Hypertension This is a chronic problem. The problem occurs daily. The problem has been gradually worsening. Associated symptoms include shortness of breath. The symptoms are aggravated by walking. The symptoms are relieved by rest.  Patient with history of hypertension presents with hypertension, palpitations and chest pressure with deep breathing.  Patient reports she is having difficult to control hypertension and went to see her PCP.  When they noted her high blood pressure and when she reported palpitations they advised to go to the ER EMS was called and she was given aspirin and nitro without any improvement She is now feeling improved after waiting in the ER waiting room     Past Medical History:  Diagnosis Date   Abnormal EKG    LVH with repolarization changes   Arthritis    Diverticulitis    Headache    migraines    Hyperlipidemia    Hyperlipidemia    Visual disorder    Torn retina    Patient Active Problem List   Diagnosis Date Noted   Colonic diverticular abscess 08/27/2020   Family history of coronary artery disease 06/13/2018   Dizziness 10/31/2017   Essential hypertension 08/08/2013   Hyperlipidemia 08/08/2013   Abnormal EKG 08/08/2013    Past Surgical History:  Procedure Laterality Date   APPENDECTOMY     COLON SURGERY     DILATATION & CURETTAGE/HYSTEROSCOPY WITH MYOSURE N/A 05/11/2015   Procedure: DILATATION & CURETTAGE/HYSTEROSCOPY WITH MYOSURE POLYPECTOMY;  Surgeon: Servando Salina, MD;  Location: [facility];  Service: Gynecology;  Laterality: N/A;   EYE SURGERY     SHOULDER ARTHROSCOPY     TONSILLECTOMY       OB History   No obstetric history on file.     Family History  Problem Relation Age of  Onset   Stroke Mother 70   Cancer Mother 66       Breast cancer   Hypertension Mother    Breast cancer Mother    Heart disease Father    Hypertension Father    Stroke Father 36   Heart disease Brother 57   Cancer Brother        Lung cancer   Cancer Maternal Grandmother        Breast cancer   Breast cancer Maternal Grandmother    Kidney disease Maternal Grandfather    Diabetes Paternal Grandmother    Hyperlipidemia Child    Breast cancer Maternal Aunt     Social History   Tobacco Use   Smoking status: Never   Smokeless tobacco: Never  Vaping Use   Vaping Use: Never used  Substance Use Topics   Alcohol use: No   Drug use: No    Home Medications Prior to Admission medications   Medication Sig Start Date End Date Taking? Authorizing Provider  acetaminophen (TYLENOL) 500 MG tablet Take 1,000 mg by mouth every 6 (six) hours as needed for headache.    [provider]  amLODipine (NORVASC) 5 MG tablet TAKE 1 TABLET EACH DAY. Patient taking differently: Take 5 mg by mouth daily. 04/18/19   Lorretta Harp, MD  atorvastatin (LIPITOR) 20 MG tablet Take 20 mg by mouth at bedtime.     [provider]  CALCIUM CARBONATE-VITAMIN D PO Take 400 mg by mouth 3 (three) times daily.    [provider]  cholecalciferol (VITAMIN D) 1000 UNITS tablet Take 1,000 Units by mouth daily.    [provider]  dorzolamide-timolol (COSOPT) 22.3-6.8 MG/ML ophthalmic solution Place 1 drop into both eyes 2 (two) times daily.    [provider]  folic acid (FOLVITE) 1 MG tablet Take 3 mg by mouth daily. 05/29/18   [provider]  meclizine (ANTIVERT) 25 MG tablet Take 1 tablet (25 mg total) by mouth 2 (two) times daily. 08/17/18   Montine Circle, PA-C  methotrexate (RHEUMATREX) 2.5 MG tablet Take 12.5 mg by mouth See admin instructions. Caution:Chemotherapy. Protect from light. Takes 12.5 mg two times a day once a week on Saturday.    [provider]  Multiple Vitamins-Minerals (ICAPS MV PO) Take 1 tablet by mouth 2 (two) times daily.    [provider]  Polyethyl Glycol-Propyl Glycol 0.4-0.3 % SOLN Place 1 drop into the right eye as needed (For dry eyes.).     [provider]  Propylene Glycol (SYSTANE BALANCE OP) Place 1 drop into both eyes daily as needed (dry eyes).    [provider]  Rimegepant Sulfate (NURTEC) 75 MG TBDP Take 75 mg by mouth daily as needed (migraine). 11/05/19   [provider]  rizatriptan (MAXALT-MLT) 10 MG disintegrating tablet Take 1 tablet by mouth daily as needed for migraine.  07/08/13   [provider]  triamterene-hydrochlorothiazide (MAXZIDE-25) 37.5-25 MG per tablet Take 0.5 tablets by mouth every other day. 07/01/13   [provider]  valACYclovir (VALTREX) 1000 MG tablet Take 1,000 mg by mouth daily. 05/29/18   [provider]    Allergies    Abreva [docosanol], Codeine, and Fosamax [alendronate]  Review of Systems   Review of Systems  Constitutional:  Negative for fever.  Respiratory:  Positive for shortness of breath.   Cardiovascular:  Negative for leg swelling.  Gastrointestinal:  Negative for vomiting.  Neurological:  Negative for syncope.  All other systems reviewed and are negative.  Physical Exam Updated Vital Signs BP (!) 159/90 (BP Location: Right Arm)   Pulse 72   Temp 98.2 F (36.8 C) (Oral)   Resp 19   Ht 1.6 m ('5\' 3"'$ )   Wt 67.6 kg   SpO2 100%   BMI 26.39 kg/m   Physical Exam CONSTITUTIONAL: Elderly, no acute distress HEAD: Normocephalic/atraumatic EYES: EOMI/PERRL ENMT: Mucous membranes moist NECK: supple no meningeal signs SPINE/BACK:entire spine nontender CV: S1/S2 noted, no murmurs/rubs/gallops noted LUNGS: Lungs are clear to auscultation bilaterally, no apparent distress ABDOMEN: soft, nontender, no rebound or guarding, bowel sounds noted throughout abdomen GU:no cva tenderness NEURO: Pt is  awake/alert/appropriate, moves all extremitiesx4.  No facial droop.  No arm or leg drift EXTREMITIES: pulses normal/equal, full ROM No lower extremity edema SKIN: warm, color normal PSYCH: no abnormalities of mood noted, alert and oriented to situation  ED Results / Procedures / Treatments   Labs (all labs ordered are listed, but only abnormal results are displayed) Labs Reviewed  COMPREHENSIVE METABOLIC PANEL - Abnormal; Notable for the following components:      Result Value   Glucose, Bld 114 (*)    All other components within normal limits  CBC WITH DIFFERENTIAL/PLATELET - Abnormal; Notable for the following components:   Hemoglobin 15.1 (*)    MCV 102.5 (*)    MCH 34.2 (*)    All other components  within normal limits  D-DIMER, QUANTITATIVE  TROPONIN I (HIGH SENSITIVITY)  TROPONIN I (HIGH SENSITIVITY)    EKG EKG Interpretation  Date/Time:  Thursday November 26 2020 03:15:12 EDT Ventricular Rate:  63 PR Interval:  164 QRS Duration: 90 QT Interval:  434 QTC Calculation: 444 R Axis:   51 Text Interpretation: Normal sinus rhythm Nonspecific ST abnormality Abnormal ECG Confirmed by Ripley Fraise [phone]) on 11/26/2020 3:25:58 AM  Radiology DG Chest 2 View  Result Date: 11/25/2020 CLINICAL DATA:  Chest pressure. EXAM: CHEST - 2 VIEW COMPARISON:  Chest x-ray dated August 16, 2018. FINDINGS: The heart size and mediastinal contours are within normal limits. Both lungs are clear. The visualized skeletal structures are unremarkable. IMPRESSION: No active cardiopulmonary disease. Electronically Signed   By: Titus Dubin M.D.   On: 11/25/2020 14:10    Procedures Procedures   Medications Ordered in ED Medications - No data to display  ED Course  I have reviewed the triage vital signs and the nursing notes.  Pertinent labs  results that were available during my care of the patient were reviewed by me and considered in my medical decision making (see chart for details).     MDM Rules/Calculators/A&P                           Patient reports difficult to control blood pressure and then began having fatigue, lightheadedness, chest tightness with breathing and then palpitations.  She is now feeling improved Work-up in the emergency department has  been unremarkable.  No dysrhythmia on EKG and no acute changes.  Patient has no history of A. Fib Patient feels improved.  Advised that she could increase her Norvasc to 10 mg She will call her primary care doctor today to monitor her BP at home  Final Clinical Impression(s) / ED Diagnoses Final diagnoses:  Primary hypertension  Palpitations    Rx / DC Orders ED Discharge Orders     None        Ripley Fraise, MD 11/26/20 [phone]

## 2020-11-26 NOTE — ED Notes (Signed)
Pt ambulated in hall with this RN. HR started out in 54-55, on the way back went up to low 80s. 100% O2 through out, felt slight pressure while walking. MD made aware

## 2020-11-27 DIAGNOSIS — I10 Essential (primary) hypertension: Secondary | ICD-10-CM | POA: Diagnosis not present

## 2020-11-27 DIAGNOSIS — Z23 Encounter for immunization: Secondary | ICD-10-CM | POA: Diagnosis not present

## 2020-11-30 DIAGNOSIS — I10 Essential (primary) hypertension: Secondary | ICD-10-CM | POA: Diagnosis not present

## 2020-12-10 DIAGNOSIS — I10 Essential (primary) hypertension: Secondary | ICD-10-CM | POA: Diagnosis not present

## 2020-12-15 DIAGNOSIS — Z961 Presence of intraocular lens: Secondary | ICD-10-CM | POA: Diagnosis not present

## 2020-12-15 DIAGNOSIS — Z79899 Other long term (current) drug therapy: Secondary | ICD-10-CM | POA: Diagnosis not present

## 2020-12-15 DIAGNOSIS — H209 Unspecified iridocyclitis: Secondary | ICD-10-CM | POA: Diagnosis not present

## 2020-12-15 DIAGNOSIS — H4052X2 Glaucoma secondary to other eye disorders, left eye, moderate stage: Secondary | ICD-10-CM | POA: Diagnosis not present

## 2020-12-15 DIAGNOSIS — H35372 Puckering of macula, left eye: Secondary | ICD-10-CM | POA: Diagnosis not present

## 2020-12-24 DIAGNOSIS — H9193 Unspecified hearing loss, bilateral: Secondary | ICD-10-CM | POA: Diagnosis not present

## 2020-12-24 DIAGNOSIS — H903 Sensorineural hearing loss, bilateral: Secondary | ICD-10-CM | POA: Diagnosis not present

## 2021-01-07 DIAGNOSIS — I10 Essential (primary) hypertension: Secondary | ICD-10-CM | POA: Diagnosis not present

## 2021-01-20 DIAGNOSIS — N644 Mastodynia: Secondary | ICD-10-CM | POA: Diagnosis not present

## 2021-01-20 DIAGNOSIS — Z124 Encounter for screening for malignant neoplasm of cervix: Secondary | ICD-10-CM | POA: Diagnosis not present

## 2021-01-20 DIAGNOSIS — Z01419 Encounter for gynecological examination (general) (routine) without abnormal findings: Secondary | ICD-10-CM | POA: Diagnosis not present

## 2021-01-21 ENCOUNTER — Other Ambulatory Visit: Payer: Self-pay | Admitting: Obstetrics and Gynecology

## 2021-01-21 ENCOUNTER — Other Ambulatory Visit: Payer: Self-pay | Admitting: Neurology

## 2021-01-21 DIAGNOSIS — N644 Mastodynia: Secondary | ICD-10-CM

## 2021-02-02 DIAGNOSIS — H4052X2 Glaucoma secondary to other eye disorders, left eye, moderate stage: Secondary | ICD-10-CM | POA: Diagnosis not present

## 2021-02-02 DIAGNOSIS — H40021 Open angle with borderline findings, high risk, right eye: Secondary | ICD-10-CM | POA: Diagnosis not present

## 2021-02-27 ENCOUNTER — Other Ambulatory Visit: Payer: Self-pay | Admitting: Cardiovascular Disease

## 2021-03-11 ENCOUNTER — Other Ambulatory Visit: Payer: Medicare Other

## 2021-03-23 DIAGNOSIS — Z79899 Other long term (current) drug therapy: Secondary | ICD-10-CM | POA: Diagnosis not present

## 2021-03-23 DIAGNOSIS — H4052X2 Glaucoma secondary to other eye disorders, left eye, moderate stage: Secondary | ICD-10-CM | POA: Diagnosis not present

## 2021-03-23 DIAGNOSIS — H209 Unspecified iridocyclitis: Secondary | ICD-10-CM | POA: Diagnosis not present

## 2021-03-23 DIAGNOSIS — H40021 Open angle with borderline findings, high risk, right eye: Secondary | ICD-10-CM | POA: Diagnosis not present

## 2021-03-23 DIAGNOSIS — H35372 Puckering of macula, left eye: Secondary | ICD-10-CM | POA: Diagnosis not present

## 2021-03-23 DIAGNOSIS — Z961 Presence of intraocular lens: Secondary | ICD-10-CM | POA: Diagnosis not present

## 2021-04-06 ENCOUNTER — Other Ambulatory Visit: Payer: Self-pay

## 2021-04-06 ENCOUNTER — Ambulatory Visit
Admission: RE | Admit: 2021-04-06 | Discharge: 2021-04-06 | Disposition: A | Discharge status: Home / Self Care | Payer: Medicare Other | Source: Ambulatory Visit | Attending: Obstetrics and Gynecology | Admitting: Obstetrics and Gynecology

## 2021-04-06 DIAGNOSIS — R922 Inconclusive mammogram: Secondary | ICD-10-CM | POA: Diagnosis not present

## 2021-04-06 DIAGNOSIS — N644 Mastodynia: Secondary | ICD-10-CM

## 2021-04-12 DIAGNOSIS — U071 COVID-19: Secondary | ICD-10-CM | POA: Diagnosis not present

## 2021-04-12 DIAGNOSIS — I10 Essential (primary) hypertension: Secondary | ICD-10-CM | POA: Diagnosis not present

## 2021-04-14 ENCOUNTER — Other Ambulatory Visit: Payer: Medicare Other

## 2021-05-06 DIAGNOSIS — H4052X2 Glaucoma secondary to other eye disorders, left eye, moderate stage: Secondary | ICD-10-CM | POA: Diagnosis not present

## 2021-05-11 ENCOUNTER — Other Ambulatory Visit: Payer: Self-pay

## 2021-05-11 ENCOUNTER — Telehealth: Payer: Self-pay | Admitting: Cardiovascular Disease

## 2021-05-11 ENCOUNTER — Ambulatory Visit (INDEPENDENT_AMBULATORY_CARE_PROVIDER_SITE_OTHER): Payer: Medicare Other | Admitting: Cardiovascular Disease

## 2021-05-11 ENCOUNTER — Encounter: Payer: Self-pay | Admitting: Cardiovascular Disease

## 2021-05-11 DIAGNOSIS — I10 Essential (primary) hypertension: Secondary | ICD-10-CM

## 2021-05-11 DIAGNOSIS — R42 Dizziness and giddiness: Secondary | ICD-10-CM

## 2021-05-11 DIAGNOSIS — E782 Mixed hyperlipidemia: Secondary | ICD-10-CM

## 2021-05-11 NOTE — Patient Instructions (Signed)

## 2021-05-11 NOTE — Assessment & Plan Note (Signed)
History of dizziness in the past with a negative event monitor.  This is no longer an issue.

## 2021-05-11 NOTE — Progress Notes (Signed)
Pt to call back with dosage of losartan that she is taking. Prescribed by Dr. Shelia Media.

## 2021-05-11 NOTE — Telephone Encounter (Signed)
Spoke with pt regarding her losartan dose. Pt is taking lorsartan/hctz 100/25mg  started by Dr. Shelia Media. Medication added to pt EMR.

## 2021-05-11 NOTE — Progress Notes (Signed)
05/11/2021 [name]   1940/04/06  [id number]  Primary Physician Kim Pretty, MD Primary Cardiologist: Kim Harp MD [name], [place]  HPI:  [name] is a 81 y.o.  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 She was referred by Dr. Dr. Deland Jimenez at [facility]  medical initially for evaluation of generalized fatigue and an abnormal EKG. I last saw her in the office 05/13/2020.  Her cardiovascular risk factor profile is remarkable for hypertension, hyperlipidemia and family history. Her father had heart-related issues in the 91s and a brother did have a stent before he died of lung cancer in his 34s. She has never had a heart attack or stroke. She does complain of dyspnea but denies chest pain. She was referred for an abnormal EKG with ST segment changes. She had a subsequent 2D echocardiogram after her office visit performed 08/14/2013 that was essentially normal as was an echo 11/07/2017.  Since I saw her 4 years ago she is been referred back for 6 episodes of presyncope which began several months ago.  She does feel palpitations during these and get some shortness of breath as well as weakness after they subside.  Holter monitor showed sinus rhythm consult sinus sinus bradycardia with occasional PVCs and occasional short atrial runs which do not appear to be contributory.     Since I saw her in the office a year ago she is remained stable.    She was hospitalized over the summer with diverticulitis and abscess.  After that her blood pressure was elevated which has since been addressed by her PCP now on therapeutic blood pressures.  She denies chest pain or shortness of breath.  Current Meds  Medication Sig   acetaminophen (TYLENOL) 500 MG tablet Take 1,000 mg by mouth every 6 (six) hours as needed for headache.   amLODipine  (NORVASC) 5 MG tablet TAKE 1 TABLET BY MOUTH EVERY DAY   atorvastatin (LIPITOR) 20 MG tablet Take 20 mg by mouth at bedtime.    CALCIUM CARBONATE-VITAMIN D PO Take 400 mg by mouth 3 (three) times daily.   cholecalciferol (VITAMIN D) 1000 UNITS tablet Take 1,000 Units by mouth daily.   dorzolamide-timolol (COSOPT) 22.3-6.8 MG/ML ophthalmic solution Place 1 drop into both eyes 2 (two) times daily.   folic acid (FOLVITE) 1 MG tablet Take 3 mg by mouth daily.   methotrexate (RHEUMATREX) 2.5 MG tablet Take 12.5 mg by mouth See admin instructions. Caution:Chemotherapy. Protect from light. Takes 12.5 mg two times a day once a week on Saturday.   Multiple Vitamins-Minerals (ICAPS MV PO) Take 1 tablet by mouth 2 (two) times daily.   polyethylene glycol powder (MIRALAX) 17 GM/SCOOP powder Take by mouth. 1 teaspoon daily   Propylene Glycol (SYSTANE BALANCE OP) Place 1 drop into both eyes daily as needed (dry eyes).   Rimegepant Sulfate (NURTEC) 75 MG TBDP Take 75 mg by mouth daily as needed (migraine).   valACYclovir (VALTREX) 1000 MG tablet Take 1,000 mg by mouth daily.     Allergies  Allergen Reactions   Abreva [Docosanol] Swelling and Other (See Comments)    Top lip and side of face swollen   Codeine Nausea Only   Fosamax [Alendronate]     Other reaction(s): chest pain    Social History   Socioeconomic History  Marital status: Widowed    Spouse name: Not on file   Number of children: Not on file   Years of education: Not on file   Highest education level: Not on file  Occupational History   Not on file  Tobacco Use   Smoking status: Never   Smokeless tobacco: Never  Vaping Use   Vaping Use: Never used  Substance and Sexual Activity   Alcohol use: No   Drug use: No   Sexual activity: Not on file  Other Topics Concern   Not on file  Social History Narrative   Not on file   Social Determinants of Health   Financial Resource Strain: Not on file  Food Insecurity: Not on file   Transportation Needs: Not on file  Physical Activity: Not on file  Stress: Not on file  Social Connections: Not on file  Intimate Partner Violence: Not on file     Review of Systems: General: negative for chills, fever, night sweats or weight changes.  Cardiovascular: negative for chest pain, dyspnea on exertion, edema, orthopnea, palpitations, paroxysmal nocturnal dyspnea or shortness of breath Dermatological: negative for rash Respiratory: negative for cough or wheezing Urologic: negative for hematuria Abdominal: negative for nausea, vomiting, diarrhea, bright red blood per rectum, melena, or hematemesis Neurologic: negative for visual changes, syncope, or dizziness All other systems reviewed and are otherwise negative except as noted above.    Blood pressure 136/82, pulse (!) 56, height 5\' 4"  (1.626 m), weight 150 lb (68 kg), SpO2 98 %.  General appearance: alert and no distress Neck: no adenopathy, no carotid bruit, no JVD, supple, symmetrical, trachea midline, and thyroid not enlarged, symmetric, no tenderness/mass/nodules Lungs: clear to auscultation bilaterally Heart: regular rate and rhythm, S1, S2 normal, no murmur, click, rub or gallop Extremities: extremities normal, atraumatic, no cyanosis or edema Pulses: 2+ and symmetric Skin: Skin color, texture, turgor normal. No rashes or lesions Neurologic: Grossly normal  EKG sinus bradycardia 56 with nonspecific ST and T wave changes.  I personally reviewed this EKG.  ASSESSMENT AND PLAN:   Essential hypertension History of essential hypertension a blood pressure measured today at 136/82.  She is on amlodipine and losartan.  Hyperlipidemia History of hyperlipidemia on statin therapy with lipid profile performed 11/07/2020 by her PCP revealing total cholesterol 154, LDL 67 and HDL 59.  Dizziness History of dizziness in the past with a negative event monitor.  This is no longer an issue.     Kim Harp MD  FACP,FACC,FAHA, [facility] 05/11/2021 9:43 AM

## 2021-05-11 NOTE — Assessment & Plan Note (Signed)
History of essential hypertension a blood pressure measured today at 136/82.  She is on amlodipine and losartan.

## 2021-05-11 NOTE — Telephone Encounter (Signed)
New Message:    Patient called and says she take Losartan 100/25 mg.

## 2021-05-11 NOTE — Assessment & Plan Note (Signed)
History of hyperlipidemia on statin therapy with lipid profile performed 11/07/2020 by her PCP revealing total cholesterol 154, LDL 67 and HDL 59.

## 2021-06-09 DIAGNOSIS — R0981 Nasal congestion: Secondary | ICD-10-CM | POA: Diagnosis not present

## 2021-06-09 DIAGNOSIS — J3089 Other allergic rhinitis: Secondary | ICD-10-CM | POA: Diagnosis not present

## 2021-07-28 ENCOUNTER — Other Ambulatory Visit: Payer: Self-pay

## 2021-07-28 ENCOUNTER — Encounter (HOSPITAL_BASED_OUTPATIENT_CLINIC_OR_DEPARTMENT_OTHER): Payer: Self-pay

## 2021-07-28 ENCOUNTER — Emergency Department (HOSPITAL_BASED_OUTPATIENT_CLINIC_OR_DEPARTMENT_OTHER): Payer: Medicare Other

## 2021-07-28 ENCOUNTER — Emergency Department (HOSPITAL_BASED_OUTPATIENT_CLINIC_OR_DEPARTMENT_OTHER)
Admission: EM | Admit: 2021-07-28 | Discharge: 2021-07-28 | Disposition: A | Discharge status: Home / Self Care | Payer: Medicare Other | Attending: Emergency Medicine | Admitting: Emergency Medicine

## 2021-07-28 DIAGNOSIS — W01198A Fall on same level from slipping, tripping and stumbling with subsequent striking against other object, initial encounter: Secondary | ICD-10-CM | POA: Diagnosis not present

## 2021-07-28 DIAGNOSIS — Y92481 Parking lot as the place of occurrence of the external cause: Secondary | ICD-10-CM | POA: Diagnosis not present

## 2021-07-28 DIAGNOSIS — S0231XA Fracture of orbital floor, right side, initial encounter for closed fracture: Secondary | ICD-10-CM | POA: Diagnosis not present

## 2021-07-28 DIAGNOSIS — S199XXA Unspecified injury of neck, initial encounter: Secondary | ICD-10-CM | POA: Diagnosis not present

## 2021-07-28 DIAGNOSIS — S0990XA Unspecified injury of head, initial encounter: Secondary | ICD-10-CM

## 2021-07-28 DIAGNOSIS — S0993XA Unspecified injury of face, initial encounter: Secondary | ICD-10-CM | POA: Diagnosis not present

## 2021-07-28 DIAGNOSIS — S0230XA Fracture of orbital floor, unspecified side, initial encounter for closed fracture: Secondary | ICD-10-CM

## 2021-07-28 MED ORDER — ACETAMINOPHEN 500 MG PO TABS
1000.0000 mg | ORAL_TABLET | Freq: Once | ORAL | Status: AC
  Administered 2021-07-28: 1000 mg via ORAL
  Filled 2021-07-28: qty 2

## 2021-07-28 NOTE — ED Notes (Signed)
Ice pack provided

## 2021-07-28 NOTE — Discharge Instructions (Signed)
Use Tylenol every 4 hours as needed for pain and ice regularly.  Minimize blowing your nose.  Follow-up closely with ENT. ? ?

## 2021-07-28 NOTE — ED Provider Notes (Signed)
?[facility] EMERGENCY DEPT ?Provider Note ? ? ?CSN: [id number] ?Arrival date & time: 07/28/21  1148 ? ?  ? ?History ? ?Chief Complaint  ?Patient presents with  ? Fall/right orbit injury  ? ? ?[name] is a 81 y.o. female. ? ?Patient presents after mechanical fall tripping at local [place] hitting the right side of her upper face and head.  No syncope or vomiting since.  Pain with pushing on that area.  Mild epistaxis is controlled.  Patient had double vision for 15 minutes and this resolved.  No neurologic signs or symptoms currently.  Patient on aspirin no other blood thinners. ? ? ?  ? ?Home Medications ?Prior to Admission medications   ?Medication Sig Start Date End Date Taking? Authorizing Provider  ?acetaminophen (TYLENOL) 500 MG tablet Take 1,000 mg by mouth every 6 (six) hours as needed for headache.    [provider]  ?amLODipine (NORVASC) 5 MG tablet TAKE 1 TABLET BY MOUTH EVERY DAY 03/01/21   Lorretta Harp, MD  ?atorvastatin (LIPITOR) 20 MG tablet Take 20 mg by mouth at bedtime.     [provider]  ?CALCIUM CARBONATE-VITAMIN D PO Take 400 mg by mouth 3 (three) times daily.    [provider]  ?cholecalciferol (VITAMIN D) 1000 UNITS tablet Take 1,000 Units by mouth daily.    [provider]  ?dorzolamide-timolol (COSOPT) 22.3-6.8 MG/ML ophthalmic solution Place 1 drop into both eyes 2 (two) times daily.    [provider]  ?folic acid (FOLVITE) 1 MG tablet Take 3 mg by mouth daily. 05/29/18   [provider]  ?losartan-hydrochlorothiazide (HYZAAR) 100-25 MG tablet Take 1 tablet by mouth daily.    [provider]  ?methotrexate (RHEUMATREX) 2.5 MG tablet Take 12.5 mg by mouth See admin instructions. Caution:Chemotherapy. Protect from light. Takes 12.5 mg two times a day once a week on Saturday.    [provider]  ?Multiple Vitamins-Minerals (ICAPS MV PO) Take 1 tablet by mouth 2 (two) times daily.     [provider]  ?polyethylene glycol powder (MIRALAX) 17 GM/SCOOP powder Take by mouth. 1 teaspoon daily    [provider]  ?Propylene Glycol (SYSTANE BALANCE OP) Place 1 drop into both eyes daily as needed (dry eyes).    [provider]  ?Rimegepant Sulfate (NURTEC) 75 MG TBDP Take 75 mg by mouth daily as needed (migraine). 11/05/19   [provider]  ?valACYclovir (VALTREX) 1000 MG tablet Take 1,000 mg by mouth daily. 05/29/18   [provider]  ?   ? ?Allergies    ?Abreva [docosanol], Codeine, and Fosamax [alendronate]   ? ?Review of Systems   ?Review of Systems  ?Constitutional:  Negative for chills and fever.  ?HENT:  Negative for congestion.   ?Eyes:  Positive for visual disturbance.  ?Respiratory:  Negative for shortness of breath.   ?Cardiovascular:  Negative for chest pain.  ?Gastrointestinal:  Negative for abdominal pain and vomiting.  ?Genitourinary:  Negative for dysuria and flank pain.  ?Musculoskeletal:  Negative for back pain, neck pain and neck stiffness.  ?Skin:  Negative for rash.  ?Neurological:  Positive for headaches. Negative for light-headedness.  ? ?Physical Exam ?Updated Vital Signs ?BP 138/87   Pulse (!) 59   Temp 97.9 ?F (36.6 ?C)   Resp 20   SpO2 99%  ?Physical Exam ?Vitals and nursing note reviewed.  ?Constitutional:   ?   General: She is not in acute distress. ?  Appearance: She is well-developed.  ?HENT:  ?   Head: Normocephalic.  ?   Comments: Patient has moderate tenderness ecchymosis erythema right upper lateral eyebrow and forehead area and tenderness and ecchymosis inferior right orbit.  Full extraocular muscle function without decreased range or difficulty.no pain with opening mouth. ?   Mouth/Throat:  ?   Mouth: Mucous membranes are moist.  ?Eyes:  ?   General:     ?   Right eye: No discharge.     ?   Left eye: No discharge.  ?   Conjunctiva/sclera: Conjunctivae normal.  ?Neck:  ?   Trachea: No tracheal deviation.   ?Cardiovascular:  ?   Rate and Rhythm: Normal rate.  ?Pulmonary:  ?   Effort: Pulmonary effort is normal.  ?Abdominal:  ?   General: There is no distension.  ?   Palpations: Abdomen is soft.  ?   Tenderness: There is no abdominal tenderness. There is no guarding.  ?Musculoskeletal:  ?   Cervical back: Normal range of motion and neck supple. No rigidity.  ?Skin: ?   General: Skin is warm.  ?   Capillary Refill: Capillary refill takes less than 2 seconds.  ?   Findings: No rash.  ?Neurological:  ?   General: No focal deficit present.  ?   Mental Status: She is alert.  ?   Cranial Nerves: No cranial nerve deficit.  ?Psychiatric:     ?   Mood and Affect: Mood normal.  ? ? ?ED Results / Procedures / Treatments   ?Labs ?(all labs ordered are listed, but only abnormal results are displayed) ?Labs Reviewed - No data to display ? ?EKG ?None ? ?Radiology ?CT Head Wo Contrast ? ?Result Date: 07/28/2021 ?CLINICAL DATA:  Fall, trauma EXAM: CT HEAD WITHOUT CONTRAST TECHNIQUE: Contiguous axial images were obtained from the base of the skull through the vertex without intravenous contrast. RADIATION DOSE REDUCTION: This exam was performed according to the departmental dose-optimization program which includes automated exposure control, adjustment of the mA and/or kV according to patient size and/or use of iterative reconstruction technique. COMPARISON:  None Available. FINDINGS: Brain: No acute intracranial hemorrhage, mass effect, or herniation. No extra-axial fluid collections. No evidence of acute territorial infarct. No hydrocephalus. Moderate cortical volume loss. Mild patchy hypodensities in the periventricular and subcortical white matter, likely secondary to chronic microvascular ischemic changes. Vascular: No hyperdense vessel or unexpected calcification. Skull: Intact with no acute fracture visualized. Sinuses/Orbits: There is an acute minimally displaced fracture of the inferior wall right orbit with associated blood  products opacifying the right maxillary sinus. Other: None. IMPRESSION: 1. No acute intracranial process identified. 2. Partially visualized acute fracture of the inferior wall right orbit, with associated blood products opacifying the right maxillary sinus. Electronically Signed   By: Ofilia Neas M.D.   On: 07/28/2021 13:11  ? ?CT Cervical Spine Wo Contrast ? ?Result Date: 07/28/2021 ?CLINICAL DATA:  Fall, trauma EXAM: CT CERVICAL SPINE WITHOUT CONTRAST TECHNIQUE: Multidetector CT imaging of the cervical spine was performed without intravenous contrast. Multiplanar CT image reconstructions were also generated. RADIATION DOSE REDUCTION: This exam was performed according to the departmental dose-optimization program which includes automated exposure control, adjustment of the mA and/or kV according to patient size and/or use of iterative reconstruction technique. COMPARISON:  None Available. FINDINGS: Alignment: Slight reversal of the normal lordotic curvature of the cervical spine, likely chronic. Minimal grade 1 anterolisthesis of C4 on C5. No acute subluxation visualized. Skull base and vertebrae:  No acute fracture. No primary bone lesion or focal pathologic process. Soft tissues and spinal canal: No prevertebral fluid or swelling. No visible canal hematoma. Disc levels: Severe intervertebral disc space narrowing at C3-C4, C5-C6 and C6-C7. Associated endplate sclerosis and osteophytes. Multilevel facet arthropathy, right worse than left. Upper chest: No acute process visualized. Other: None. IMPRESSION: 1. No acute fracture or malalignment identified in the cervical spine. 2. Multilevel degenerative changes. Electronically Signed   By: Ofilia Neas M.D.   On: 07/28/2021 13:26  ? ?CT Maxillofacial Wo Contrast ? ?Result Date: 07/28/2021 ?CLINICAL DATA:  Facial trauma, fall EXAM: CT MAXILLOFACIAL WITHOUT CONTRAST TECHNIQUE: Multidetector CT imaging of the maxillofacial structures was performed. Multiplanar  CT image reconstructions were also generated. RADIATION DOSE REDUCTION: This exam was performed according to the departmental dose-optimization program which includes automated exposure control, adjustment of the mA and/or

## 2021-07-28 NOTE — ED Triage Notes (Signed)
Lls me she tripped (didn't pass out[place] at [place] local [place]. She states she fell forward, thereby striking her right face/orbit area. Her right orbit is edematous and ecchymotic with minimal right-sided epistaxis. She is ambulatory, alert and oriented x 4 with clear speech. She also states that "I saw double for about 15 minutes after I fell", but that has now completely resolved. ?

## 2021-07-29 DIAGNOSIS — M25511 Pain in right shoulder: Secondary | ICD-10-CM | POA: Diagnosis not present

## 2021-07-29 DIAGNOSIS — M79641 Pain in right hand: Secondary | ICD-10-CM | POA: Diagnosis not present

## 2021-08-03 DIAGNOSIS — S0230XB Fracture of orbital floor, unspecified side, initial encounter for open fracture: Secondary | ICD-10-CM | POA: Diagnosis not present

## 2021-08-24 DIAGNOSIS — Z85828 Personal history of other malignant neoplasm of skin: Secondary | ICD-10-CM | POA: Diagnosis not present

## 2021-08-24 DIAGNOSIS — H209 Unspecified iridocyclitis: Secondary | ICD-10-CM | POA: Diagnosis not present

## 2021-08-24 DIAGNOSIS — H40021 Open angle with borderline findings, high risk, right eye: Secondary | ICD-10-CM | POA: Diagnosis not present

## 2021-08-24 DIAGNOSIS — H35372 Puckering of macula, left eye: Secondary | ICD-10-CM | POA: Diagnosis not present

## 2021-08-24 DIAGNOSIS — Z961 Presence of intraocular lens: Secondary | ICD-10-CM | POA: Diagnosis not present

## 2021-08-24 DIAGNOSIS — Z79899 Other long term (current) drug therapy: Secondary | ICD-10-CM | POA: Diagnosis not present

## 2021-08-24 DIAGNOSIS — H4052X2 Glaucoma secondary to other eye disorders, left eye, moderate stage: Secondary | ICD-10-CM | POA: Diagnosis not present

## 2021-11-11 DIAGNOSIS — I10 Essential (primary) hypertension: Secondary | ICD-10-CM | POA: Diagnosis not present

## 2021-11-11 DIAGNOSIS — M810 Age-related osteoporosis without current pathological fracture: Secondary | ICD-10-CM | POA: Diagnosis not present

## 2021-11-11 DIAGNOSIS — E7801 Familial hypercholesterolemia: Secondary | ICD-10-CM | POA: Diagnosis not present

## 2021-11-16 DIAGNOSIS — K219 Gastro-esophageal reflux disease without esophagitis: Secondary | ICD-10-CM | POA: Diagnosis not present

## 2021-11-16 DIAGNOSIS — E7801 Familial hypercholesterolemia: Secondary | ICD-10-CM | POA: Diagnosis not present

## 2021-11-16 DIAGNOSIS — I700 Atherosclerosis of aorta: Secondary | ICD-10-CM | POA: Diagnosis not present

## 2021-11-16 DIAGNOSIS — Z23 Encounter for immunization: Secondary | ICD-10-CM | POA: Diagnosis not present

## 2021-11-16 DIAGNOSIS — M159 Polyosteoarthritis, unspecified: Secondary | ICD-10-CM | POA: Diagnosis not present

## 2021-11-16 DIAGNOSIS — M810 Age-related osteoporosis without current pathological fracture: Secondary | ICD-10-CM | POA: Diagnosis not present

## 2021-11-16 DIAGNOSIS — H209 Unspecified iridocyclitis: Secondary | ICD-10-CM | POA: Diagnosis not present

## 2021-11-16 DIAGNOSIS — Z0000 Encounter for general adult medical examination without abnormal findings: Secondary | ICD-10-CM | POA: Diagnosis not present

## 2021-11-16 DIAGNOSIS — I10 Essential (primary) hypertension: Secondary | ICD-10-CM | POA: Diagnosis not present

## 2021-11-16 DIAGNOSIS — B351 Tinea unguium: Secondary | ICD-10-CM | POA: Diagnosis not present

## 2021-11-16 DIAGNOSIS — L308 Other specified dermatitis: Secondary | ICD-10-CM | POA: Diagnosis not present

## 2021-11-24 ENCOUNTER — Other Ambulatory Visit (HOSPITAL_COMMUNITY): Payer: Self-pay | Admitting: Internal Medicine

## 2021-11-24 ENCOUNTER — Other Ambulatory Visit: Payer: Self-pay | Admitting: Internal Medicine

## 2021-11-24 DIAGNOSIS — I700 Atherosclerosis of aorta: Secondary | ICD-10-CM

## 2021-11-25 ENCOUNTER — Other Ambulatory Visit: Payer: Self-pay | Admitting: Cardiovascular Disease

## 2021-12-10 DIAGNOSIS — Z23 Encounter for immunization: Secondary | ICD-10-CM | POA: Diagnosis not present

## 2021-12-11 DIAGNOSIS — M810 Age-related osteoporosis without current pathological fracture: Secondary | ICD-10-CM | POA: Diagnosis not present

## 2021-12-11 DIAGNOSIS — E7800 Pure hypercholesterolemia, unspecified: Secondary | ICD-10-CM | POA: Diagnosis not present

## 2021-12-11 DIAGNOSIS — I10 Essential (primary) hypertension: Secondary | ICD-10-CM | POA: Diagnosis not present

## 2021-12-27 ENCOUNTER — Ambulatory Visit (HOSPITAL_BASED_OUTPATIENT_CLINIC_OR_DEPARTMENT_OTHER)
Admission: RE | Admit: 2021-12-27 | Discharge: 2021-12-27 | Disposition: A | Discharge status: Home / Self Care | Payer: Medicare Other | Source: Ambulatory Visit | Attending: Internal Medicine | Admitting: Internal Medicine

## 2021-12-27 DIAGNOSIS — I700 Atherosclerosis of aorta: Secondary | ICD-10-CM | POA: Insufficient documentation

## 2022-01-10 DIAGNOSIS — H4052X2 Glaucoma secondary to other eye disorders, left eye, moderate stage: Secondary | ICD-10-CM | POA: Diagnosis not present

## 2022-01-11 DIAGNOSIS — Z79899 Other long term (current) drug therapy: Secondary | ICD-10-CM | POA: Diagnosis not present

## 2022-01-11 DIAGNOSIS — H209 Unspecified iridocyclitis: Secondary | ICD-10-CM | POA: Diagnosis not present

## 2022-01-11 DIAGNOSIS — H4052X2 Glaucoma secondary to other eye disorders, left eye, moderate stage: Secondary | ICD-10-CM | POA: Diagnosis not present

## 2022-01-11 DIAGNOSIS — Z961 Presence of intraocular lens: Secondary | ICD-10-CM | POA: Diagnosis not present

## 2022-01-11 DIAGNOSIS — H35372 Puckering of macula, left eye: Secondary | ICD-10-CM | POA: Diagnosis not present

## 2022-01-17 DIAGNOSIS — R208 Other disturbances of skin sensation: Secondary | ICD-10-CM | POA: Diagnosis not present

## 2022-01-17 DIAGNOSIS — I2510 Atherosclerotic heart disease of native coronary artery without angina pectoris: Secondary | ICD-10-CM | POA: Diagnosis not present

## 2022-01-24 DIAGNOSIS — H3090 Unspecified chorioretinal inflammation, unspecified eye: Secondary | ICD-10-CM | POA: Diagnosis not present

## 2022-01-24 DIAGNOSIS — I10 Essential (primary) hypertension: Secondary | ICD-10-CM | POA: Diagnosis not present

## 2022-01-24 DIAGNOSIS — Z01419 Encounter for gynecological examination (general) (routine) without abnormal findings: Secondary | ICD-10-CM | POA: Diagnosis not present

## 2022-01-24 DIAGNOSIS — Z8719 Personal history of other diseases of the digestive system: Secondary | ICD-10-CM | POA: Diagnosis not present

## 2022-01-24 DIAGNOSIS — M810 Age-related osteoporosis without current pathological fracture: Secondary | ICD-10-CM | POA: Diagnosis not present

## 2022-01-24 DIAGNOSIS — E785 Hyperlipidemia, unspecified: Secondary | ICD-10-CM | POA: Diagnosis not present

## 2022-01-25 ENCOUNTER — Encounter: Payer: Self-pay | Admitting: Diagnostic Neuroimaging

## 2022-01-25 ENCOUNTER — Encounter: Payer: Self-pay | Admitting: Neurology

## 2022-01-25 ENCOUNTER — Ambulatory Visit (INDEPENDENT_AMBULATORY_CARE_PROVIDER_SITE_OTHER): Payer: Medicare Other | Admitting: Diagnostic Neuroimaging

## 2022-01-25 VITALS — BP 127/76 | HR 61 | Ht 62.0 in | Wt 155.0 lb

## 2022-01-25 DIAGNOSIS — R519 Headache, unspecified: Secondary | ICD-10-CM

## 2022-01-25 DIAGNOSIS — G500 Trigeminal neuralgia: Secondary | ICD-10-CM

## 2022-01-25 DIAGNOSIS — M5481 Occipital neuralgia: Secondary | ICD-10-CM

## 2022-01-25 NOTE — Progress Notes (Signed)
[facility]  PATIENT: [name] DOB: 1940/08/09  REFERRING CLINICIAN: Deland Pretty, MD HISTORY FROM: patient REASON FOR VISIT: new consult   HISTORICAL  CHIEF COMPLAINT:  Chief Complaint  Patient presents with   Headache    Rm 7 alone Pt Is well, has been having sharp pain in scalp since October 28th. Thought it was shingles     HISTORY OF PRESENT ILLNESS:   81 year old female here for evaluation of right scalp pain.  January 08, 2022 patient woke up and felt pain on the right parietal and right temporal region.  When she touched on the right side it would trigger a severe burning sharp pain.  Symptoms lasted severely for about 1 week and have been subsiding.  Now just has a dull abnormal sensation but no significant pain.  07/28/2021 patient [place] walking [place] church parking [place], tripped on a rock and fell and hit her right eye and temporal region severely.  She had right orbital fracture and was evaluated in the emergency room, treated conservatively.  Symptoms lasted for several weeks to several months and gradually improved.    REVIEW OF SYSTEMS: Full 14 system review of systems performed and negative with exception of: As per HPI.  ALLERGIES: Allergies  Allergen Reactions   Abreva [Docosanol] Swelling and Other (See Comments)    Top lip and side of face swollen   Codeine Nausea Only   Fosamax [Alendronate]     Other reaction(s): chest pain    HOME MEDICATIONS: Outpatient Medications Prior to Visit  Medication Sig Dispense Refill   acetaminophen (TYLENOL) 500 MG tablet Take 1,000 mg by mouth every 6 (six) hours as needed for headache.     amLODipine (NORVASC) 5 MG tablet TAKE 1 TABLET BY MOUTH EVERY DAY 90 tablet 1   aspirin EC 81 MG tablet Take 81 mg by mouth daily. Swallow whole.     atorvastatin (LIPITOR) 40 MG tablet Take 20 mg by mouth at bedtime.     CALCIUM CARBONATE-VITAMIN D PO Take 400 mg by mouth 3 (three) times daily.      cholecalciferol (VITAMIN D) 1000 UNITS tablet Take 1,000 Units by mouth daily.     ciclopirox (PENLAC) 8 % solution 1 application Externally Once a day for 90 days     dorzolamide-timolol (COSOPT) 22.3-6.8 MG/ML ophthalmic solution Place 1 drop into both eyes 2 (two) times daily.     folic acid (FOLVITE) 1 MG tablet Take 3 mg by mouth daily.     losartan-hydrochlorothiazide (HYZAAR) 100-25 MG tablet Take 1 tablet by mouth daily.     methotrexate (RHEUMATREX) 2.5 MG tablet Take 12.5 mg by mouth See admin instructions. Caution:Chemotherapy. Protect from light. Takes 4 tables weekly     Multiple Vitamins-Minerals (ICAPS MV PO) Take 1 tablet by mouth 2 (two) times daily.     Multiple Vitamins-Minerals (ICAPS PO) Take 1 capsule by mouth 2 (two) times daily.     polyethylene glycol powder (MIRALAX) 17 GM/SCOOP powder Take by mouth. 1 teaspoon daily     Propylene Glycol (SYSTANE BALANCE OP) Place 1 drop into both eyes daily as needed (dry eyes).     Rimegepant Sulfate (NURTEC) 75 MG TBDP Take 75 mg by mouth daily as needed (migraine).     timolol (BETIMOL) 0.25 % ophthalmic solution Apply 1 drop to eye 2 (two) times daily.     triamcinolone ointment (KENALOG) 0.1 % 1 application to affected area Externally Twice a day as needed for 14  days     valACYclovir (VALTREX) 1000 MG tablet Take 1,000 mg by mouth daily.     No facility-administered medications prior to visit.    PAST MEDICAL HISTORY: Past Medical History:  Diagnosis Date   Abnormal EKG    LVH with repolarization changes   Arthritis    Diverticulitis    GERD (gastroesophageal reflux disease)    Headache    migraines    Hyperlipidemia    Hyperlipidemia    Hypertension    Osteoporosis    Visual disorder    Torn retina    PAST SURGICAL HISTORY: Past Surgical History:  Procedure Laterality Date   APPENDECTOMY     COLON SURGERY     DILATATION & CURETTAGE/HYSTEROSCOPY WITH MYOSURE N/A 05/11/2015   Procedure: [place] POLYPECTOMY;  Surgeon: Servando Salina, MD;  Location: [facility];  Service: Gynecology;  Laterality: N/A;   EYE SURGERY     SHOULDER ARTHROSCOPY     TONSILLECTOMY      FAMILY HISTORY: Family History  Problem Relation Age of Onset   Stroke Mother 81   Cancer Mother 48       Breast cancer   Hypertension Mother    Breast cancer Mother    Heart disease Father    Hypertension Father    Stroke Father 29   Heart disease Brother 8   Cancer Brother        Lung cancer   Cancer Maternal Grandmother        Breast cancer   Breast cancer Maternal Grandmother    Kidney disease Maternal Grandfather    Diabetes Paternal Grandmother    Hyperlipidemia Child    Breast cancer Maternal Aunt     SOCIAL HISTORY: Social History   Socioeconomic History   Marital status: Widowed    Spouse name: Not on file   Number of children: Not on file   Years of education: Not on file   Highest education level: Not on file  Occupational History   Not on file  Tobacco Use   Smoking status: Never   Smokeless tobacco: Never  Vaping Use   Vaping Use: Never used  Substance and Sexual Activity   Alcohol use: No   Drug use: No   Sexual activity: Not on file  Other Topics Concern   Not on file  Social History Narrative   Not on file   Social Determinants of Health   Financial Resource Strain: Not on file  Food Insecurity: Not on file  Transportation Needs: Not on file  Physical Activity: Not on file  Stress: Not on file  Social Connections: Not on file  Intimate Partner Violence: Not on file     PHYSICAL EXAM  GENERAL EXAM/CONSTITUTIONAL: Vitals:  Vitals:   01/25/22 1358  BP: 127/76  Pulse: 61  Weight: 155 lb (70.3 kg)  Height: _0  (1.575 m)   Body mass index is 28.35 kg/m. Wt Readings from Last 3 Encounters:  01/25/22 155 lb (70.3 kg)  05/11/21 150 lb (68 kg)  11/26/20 149 lb (67.6 kg)   Patient is in no distress; well developed, nourished  and groomed; neck is supple  CARDIOVASCULAR: Examination of carotid arteries is normal; no carotid bruits; NO TEMPORAL ARTERY TENDERNESS Regular rate and rhythm, no murmurs Examination of peripheral vascular system by observation and palpation is normal  EYES: Ophthalmoscopic exam of optic discs and posterior segments is normal; no papilledema or hemorrhages No results found.  MUSCULOSKELETAL: Gait, strength,  tone, movements noted in Neurologic exam below  NEUROLOGIC: MENTAL STATUS:      No data to display         awake, alert, oriented to person, place and time recent and remote memory intact normal attention and concentration language fluent, comprehension intact, naming intact fund of knowledge appropriate  CRANIAL NERVE:  2nd - no papilledema on fundoscopic exam 2nd, 3rd, 4th, 6th - pupils equal and reactive to light, visual fields full to confrontation, extraocular muscles intact, no nystagmus 5th - facial sensation symmetric 7th - facial strength symmetric 8th - hearing intact 9th - palate elevates symmetrically, uvula midline 11th - shoulder shrug symmetric 12th - tongue protrusion midline  MOTOR:  normal bulk and tone, full strength in the BUE, BLE  SENSORY:  normal and symmetric to light touch, temperature, vibration  COORDINATION:  finger-nose-finger, fine finger movements normal  REFLEXES:  deep tendon reflexes TRACE and symmetric  GAIT/STATION:  narrow based gait     DIAGNOSTIC DATA (LABS, IMAGING, TESTING) - I reviewed patient records, labs, notes, testing and imaging myself where available.  Lab Results  Component Value Date   WBC 7.9 11/25/2020   HGB 15.1 (H) 11/25/2020   HCT 45.2 11/25/2020   MCV 102.5 (H) 11/25/2020   PLT 224 11/25/2020      Component Value Date/Time   NA 137 11/25/2020 1335   K 4.0 11/25/2020 1335   CL 103 11/25/2020 1335   CO2 25 11/25/2020 1335   GLUCOSE 114 (H) 11/25/2020 1335   BUN 12 11/25/2020 1335    CREATININE 0.86 11/25/2020 1335   CALCIUM 9.6 11/25/2020 1335   PROT 6.7 11/25/2020 1335   ALBUMIN 4.0 11/25/2020 1335   AST 25 11/25/2020 1335   ALT 32 11/25/2020 1335   ALKPHOS 79 11/25/2020 1335   BILITOT 1.0 11/25/2020 1335   GFRNONAA >60 11/25/2020 1335   GFRAA >60 08/16/2018 2259   No results found for: "CHOL", "HDL", "LDLCALC", "LDLDIRECT", "TRIG", "CHOLHDL" No results found for: "HGBA1C" No results found for: "VITAMINB12" No results found for: "TSH"   07/28/21 CT head  1. No acute intracranial process identified. 2. Partially visualized acute fracture of the inferior wall right orbit, with associated blood products opacifying the right maxillary sinus.  07/28/21 CT maxillofacial 1. Acute comminuted and minimally displaced fracture of the inferior wall right orbit as described. 2. Near-complete opacification of the right maxillary sinus with blood products. 3. Right periorbital soft tissue swelling.    ASSESSMENT AND PLAN  81 y.o. year old female here with:   Dx:  1. Scalp pain   2. Occipital neuralgia of right side   3. Trigeminal neuralgia of right side of face     PLAN:  RIGHT PARIETAL / TEMPORAL SCALP PAIN (hypersensitivity; Oct 2023 x 3-4 weeks) - could have been post-traumatic from fall in May 2023; other possibilities include herpes zoster (without rash), occipital neuralgia, migraine variant - symptoms are improving spontaneously, so therefore can monitor - consider gabapentin or carbamazepine in future if symptoms worsen; may also consider MRI brain, ESR, CRP in future if sxs worsen  Return for pending if symptoms worsen or fail to improve.    Penni Bombard, MD 93/57/0177, 9:39 PM Certified in Neurology, Neurophysiology and Neuroimaging  [facility] [address] [place], [place] [address] [phone]

## 2022-01-25 NOTE — Patient Instructions (Signed)
  RIGHT PARIETAL / TEMPORAL SCALP PAIN (hypersensitivity; Oct 2023 x 3-4 weeks) - could have been post-traumatic from fall in May 2023; other possibilities include herpes zoster (without rash), occipital neuralgia, migraine variant - symptoms are improving spontaneously, so therefore can monitor - consider gabapentin or carbamazepine in future if symptoms worsen

## 2022-02-23 ENCOUNTER — Other Ambulatory Visit: Payer: Self-pay | Admitting: Obstetrics and Gynecology

## 2022-02-23 DIAGNOSIS — Z1231 Encounter for screening mammogram for malignant neoplasm of breast: Secondary | ICD-10-CM

## 2022-03-10 DIAGNOSIS — J45909 Unspecified asthma, uncomplicated: Secondary | ICD-10-CM | POA: Diagnosis not present

## 2022-03-13 DIAGNOSIS — E7800 Pure hypercholesterolemia, unspecified: Secondary | ICD-10-CM | POA: Diagnosis not present

## 2022-03-13 DIAGNOSIS — I10 Essential (primary) hypertension: Secondary | ICD-10-CM | POA: Diagnosis not present

## 2022-03-13 DIAGNOSIS — M810 Age-related osteoporosis without current pathological fracture: Secondary | ICD-10-CM | POA: Diagnosis not present

## 2022-04-19 DIAGNOSIS — Z961 Presence of intraocular lens: Secondary | ICD-10-CM | POA: Diagnosis not present

## 2022-04-19 DIAGNOSIS — H4052X2 Glaucoma secondary to other eye disorders, left eye, moderate stage: Secondary | ICD-10-CM | POA: Diagnosis not present

## 2022-04-19 DIAGNOSIS — H35372 Puckering of macula, left eye: Secondary | ICD-10-CM | POA: Diagnosis not present

## 2022-04-19 DIAGNOSIS — Z79899 Other long term (current) drug therapy: Secondary | ICD-10-CM | POA: Diagnosis not present

## 2022-04-19 DIAGNOSIS — H209 Unspecified iridocyclitis: Secondary | ICD-10-CM | POA: Diagnosis not present

## 2022-04-21 ENCOUNTER — Ambulatory Visit
Admission: RE | Admit: 2022-04-21 | Discharge: 2022-04-21 | Disposition: A | Discharge status: Home / Self Care | Payer: Medicare Other | Source: Ambulatory Visit | Attending: Obstetrics and Gynecology | Admitting: Obstetrics and Gynecology

## 2022-04-21 DIAGNOSIS — Z1231 Encounter for screening mammogram for malignant neoplasm of breast: Secondary | ICD-10-CM | POA: Diagnosis not present

## 2022-05-11 ENCOUNTER — Encounter: Payer: Self-pay | Admitting: Cardiovascular Disease

## 2022-05-11 ENCOUNTER — Ambulatory Visit: Payer: Medicare Other | Attending: Cardiovascular Disease | Admitting: Cardiovascular Disease

## 2022-05-11 VITALS — BP 130/78 | HR 54 | Ht 63.0 in | Wt 154.4 lb

## 2022-05-11 DIAGNOSIS — E782 Mixed hyperlipidemia: Secondary | ICD-10-CM | POA: Insufficient documentation

## 2022-05-11 DIAGNOSIS — R931 Abnormal findings on diagnostic imaging of heart and coronary circulation: Secondary | ICD-10-CM | POA: Insufficient documentation

## 2022-05-11 DIAGNOSIS — I10 Essential (primary) hypertension: Secondary | ICD-10-CM | POA: Insufficient documentation

## 2022-05-11 NOTE — Progress Notes (Signed)
05/11/2022 [name]   Mar 29, 1940  [id number]  Primary Physician Deland Pretty, MD Primary Cardiologist: Lorretta Harp MD [name], [place]  HPI:  [name] is a 82 y.o.  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 She was referred by Dr. Dr. Deland Pretty at [facility]  medical initially for evaluation of generalized fatigue and an abnormal EKG. I last saw her in the office 05/11/2021.  Her cardiovascular risk factor profile is remarkable for hypertension, hyperlipidemia and family history. Her father had heart-related issues in the 26s and a brother did have a stent before he died of lung cancer in his 74s. She has never had a heart attack or stroke. She does complain of dyspnea but denies chest pain. She was referred for an abnormal EKG with ST segment changes.  She had a subsequent 2D echocardiogram after her office visit performed 08/14/2013 that was essentially normal as was an echo 11/07/2017.  Since I saw her 4 years ago she is been referred back for 6 episodes of presyncope which began several months ago.  She does feel palpitations during these and get some shortness of breath as well as weakness after they subside.  Holter monitor showed sinus rhythm consult sinus sinus bradycardia with occasional PVCs and occasional short atrial runs which do not appear to be contributory.     Since I saw her in the office a year ago she is remained stable.    She denies loss of breath.  She did have a coronary calcium score performed 12/27/2021 which was 101 all of which was in the LAD territory.  She is completely asymptomatic.  Her most recent lipid profile performed by her PCP 11/11/2021 revealed an LDL of 65.   Current Meds  Medication Sig   acetaminophen (TYLENOL) 500 MG tablet Take 1,000 mg by mouth every 6 (six) hours as needed for  headache.   amLODipine (NORVASC) 5 MG tablet TAKE 1 TABLET BY MOUTH EVERY DAY   aspirin EC 81 MG tablet Take 81 mg by mouth daily. Swallow whole.   atorvastatin (LIPITOR) 40 MG tablet Take 20 mg by mouth at bedtime.   CALCIUM CARBONATE-VITAMIN D PO Take 400 mg by mouth 3 (three) times daily.   cholecalciferol (VITAMIN D) 1000 UNITS tablet Take 1,000 Units by mouth daily.   ciclopirox (PENLAC) 8 % solution 1 application Externally Once a day for 90 days   dorzolamide-timolol (COSOPT) 22.3-6.8 MG/ML ophthalmic solution Place 1 drop into both eyes 2 (two) times daily.   folic acid (FOLVITE) 1 MG tablet Take 3 mg by mouth daily.   losartan-hydrochlorothiazide (HYZAAR) 100-25 MG tablet Take 1 tablet by mouth daily.   methotrexate (RHEUMATREX) 2.5 MG tablet Take 12.5 mg by mouth See admin instructions. Caution:Chemotherapy. Protect from light. Takes 4 tables weekly   polyethylene glycol powder (MIRALAX) 17 GM/SCOOP powder Take by mouth. 1 teaspoon daily   Propylene Glycol (SYSTANE BALANCE OP) Place 1 drop into both eyes daily as needed (dry eyes).   Rimegepant Sulfate (NURTEC) 75 MG TBDP Take 75 mg by mouth daily as needed (migraine).   timolol (BETIMOL) 0.25 % ophthalmic solution Apply 1 drop to eye 2 (two) times daily.   triamcinolone ointment (KENALOG) 0.1 % 1 application to affected area Externally Twice a day as needed for 14 days  valACYclovir (VALTREX) 1000 MG tablet Take 1,000 mg by mouth daily.     Allergies  Allergen Reactions   Abreva [Docosanol] Swelling and Other (See Comments)    Top lip and side of face swollen   Codeine Nausea Only   Fosamax [Alendronate]     Other reaction(s): chest pain    Social History   Socioeconomic History   Marital status: Widowed    Spouse name: Not on file   Number of children: Not on file   Years of education: Not on file   Highest education level: Not on file  Occupational History   Not on file  Tobacco Use   Smoking status: Never    Smokeless tobacco: Never  Vaping Use   Vaping Use: Never used  Substance and Sexual Activity   Alcohol use: No   Drug use: No   Sexual activity: Not on file  Other Topics Concern   Not on file  Social History Narrative   Not on file   Social Determinants of Health   Financial Resource Strain: Not on file  Food Insecurity: Not on file  Transportation Needs: Not on file  Physical Activity: Not on file  Stress: Not on file  Social Connections: Not on file  Intimate Partner Violence: Not on file     Review of Systems: General: negative for chills, fever, night sweats or weight changes.  Cardiovascular: negative for chest pain, dyspnea on exertion, edema, orthopnea, palpitations, paroxysmal nocturnal dyspnea or shortness of breath Dermatological: negative for rash Respiratory: negative for cough or wheezing Urologic: negative for hematuria Abdominal: negative for nausea, vomiting, diarrhea, bright red blood per rectum, melena, or hematemesis Neurologic: negative for visual changes, syncope, or dizziness All other systems reviewed and are otherwise negative except as noted above.    Blood pressure 130/78, pulse (!) 54, height '5\' 3"'$  (1.6 m), weight 154 lb 6.4 oz (70 kg), SpO2 96 %.  General appearance: alert and no distress Neck: no adenopathy, no carotid bruit, no JVD, supple, symmetrical, trachea midline, and thyroid not enlarged, symmetric, no tenderness/mass/nodules Lungs: clear to auscultation bilaterally Heart: regular rate and rhythm, S1, S2 normal, no murmur, click, rub or gallop Extremities: extremities normal, atraumatic, no cyanosis or edema Pulses: 2+ and symmetric Skin: Skin color, texture, turgor normal. No rashes or lesions Neurologic: Grossly normal  EKG sinus bradycardia 54 with nonspecific ST and T wave changes.  I personally reviewed this EKG.  ASSESSMENT AND PLAN:   Essential hypertension History of essential hypertension blood pressure measured today  130/78.  She is on amlodipine, losartan, and hydrochlorothiazide.  Hyperlipidemia History of hyperlipidemia on statin therapy with lipid profile performed by her PCP 11/12/2021 revealing total cholesterol 152, LDL 65 and HDL 62.  Elevated coronary artery calcium score Coronary calcium score ordered by her PCP 12/27/2021 which was 101 total all of which was in the LAD.  She is completely asymptomatic.  Her LDL is at 1 which is acceptable for secondary prevention.     Lorretta Harp MD FACP,FACC,FAHA, [facility] 05/11/2022 11:17 AM

## 2022-05-11 NOTE — Patient Instructions (Addendum)

## 2022-05-11 NOTE — Assessment & Plan Note (Signed)
History of hyperlipidemia on statin therapy with lipid profile performed by her PCP 11/12/2021 revealing total cholesterol 152, LDL 65 and HDL 62.

## 2022-05-11 NOTE — Assessment & Plan Note (Signed)
Coronary calcium score ordered by her PCP 12/27/2021 which was 101 total all of which was in the LAD.  She is completely asymptomatic.  Her LDL is at 28 which is acceptable for secondary prevention.

## 2022-05-11 NOTE — Assessment & Plan Note (Signed)
History of essential hypertension blood pressure measured today 130/78.  She is on amlodipine, losartan, and hydrochlorothiazide.

## 2022-05-23 ENCOUNTER — Other Ambulatory Visit: Payer: Self-pay | Admitting: Cardiovascular Disease

## 2022-05-23 ENCOUNTER — Ambulatory Visit: Payer: Medicare Other | Admitting: Diagnostic Neuroimaging

## 2022-05-23 DIAGNOSIS — M25562 Pain in left knee: Secondary | ICD-10-CM | POA: Diagnosis not present

## 2022-06-12 DIAGNOSIS — M810 Age-related osteoporosis without current pathological fracture: Secondary | ICD-10-CM | POA: Diagnosis not present

## 2022-06-12 DIAGNOSIS — I10 Essential (primary) hypertension: Secondary | ICD-10-CM | POA: Diagnosis not present

## 2022-06-12 DIAGNOSIS — E7800 Pure hypercholesterolemia, unspecified: Secondary | ICD-10-CM | POA: Diagnosis not present

## 2022-06-28 DIAGNOSIS — H6123 Impacted cerumen, bilateral: Secondary | ICD-10-CM | POA: Diagnosis not present

## 2022-07-18 DIAGNOSIS — H4052X2 Glaucoma secondary to other eye disorders, left eye, moderate stage: Secondary | ICD-10-CM | POA: Diagnosis not present

## 2022-07-23 DIAGNOSIS — H4052X2 Glaucoma secondary to other eye disorders, left eye, moderate stage: Secondary | ICD-10-CM | POA: Diagnosis not present

## 2022-09-19 DIAGNOSIS — B353 Tinea pedis: Secondary | ICD-10-CM | POA: Diagnosis not present

## 2022-11-01 DIAGNOSIS — H4052X2 Glaucoma secondary to other eye disorders, left eye, moderate stage: Secondary | ICD-10-CM | POA: Diagnosis not present

## 2022-11-01 DIAGNOSIS — H209 Unspecified iridocyclitis: Secondary | ICD-10-CM | POA: Diagnosis not present

## 2022-11-01 DIAGNOSIS — Z79899 Other long term (current) drug therapy: Secondary | ICD-10-CM | POA: Diagnosis not present

## 2022-11-01 DIAGNOSIS — Z961 Presence of intraocular lens: Secondary | ICD-10-CM | POA: Diagnosis not present

## 2022-11-01 DIAGNOSIS — H35372 Puckering of macula, left eye: Secondary | ICD-10-CM | POA: Diagnosis not present

## 2022-11-18 DIAGNOSIS — I10 Essential (primary) hypertension: Secondary | ICD-10-CM | POA: Diagnosis not present

## 2022-11-18 DIAGNOSIS — M810 Age-related osteoporosis without current pathological fracture: Secondary | ICD-10-CM | POA: Diagnosis not present

## 2022-11-23 DIAGNOSIS — M810 Age-related osteoporosis without current pathological fracture: Secondary | ICD-10-CM | POA: Diagnosis not present

## 2022-11-23 DIAGNOSIS — K5130 Ulcerative (chronic) rectosigmoiditis without complications: Secondary | ICD-10-CM | POA: Diagnosis not present

## 2022-11-23 DIAGNOSIS — Z23 Encounter for immunization: Secondary | ICD-10-CM | POA: Diagnosis not present

## 2022-11-23 DIAGNOSIS — J309 Allergic rhinitis, unspecified: Secondary | ICD-10-CM | POA: Diagnosis not present

## 2022-11-23 DIAGNOSIS — I2510 Atherosclerotic heart disease of native coronary artery without angina pectoris: Secondary | ICD-10-CM | POA: Diagnosis not present

## 2022-11-23 DIAGNOSIS — R55 Syncope and collapse: Secondary | ICD-10-CM | POA: Diagnosis not present

## 2022-11-23 DIAGNOSIS — I10 Essential (primary) hypertension: Secondary | ICD-10-CM | POA: Diagnosis not present

## 2022-11-23 DIAGNOSIS — Z0000 Encounter for general adult medical examination without abnormal findings: Secondary | ICD-10-CM | POA: Diagnosis not present

## 2022-11-23 DIAGNOSIS — G43909 Migraine, unspecified, not intractable, without status migrainosus: Secondary | ICD-10-CM | POA: Diagnosis not present

## 2022-11-23 DIAGNOSIS — M25562 Pain in left knee: Secondary | ICD-10-CM | POA: Diagnosis not present

## 2022-11-23 DIAGNOSIS — H209 Unspecified iridocyclitis: Secondary | ICD-10-CM | POA: Diagnosis not present

## 2022-11-23 DIAGNOSIS — I700 Atherosclerosis of aorta: Secondary | ICD-10-CM | POA: Diagnosis not present

## 2022-11-28 DIAGNOSIS — M25562 Pain in left knee: Secondary | ICD-10-CM | POA: Diagnosis not present

## 2022-11-28 DIAGNOSIS — M1712 Unilateral primary osteoarthritis, left knee: Secondary | ICD-10-CM | POA: Diagnosis not present

## 2022-12-09 DIAGNOSIS — R55 Syncope and collapse: Secondary | ICD-10-CM | POA: Diagnosis not present

## 2022-12-12 DIAGNOSIS — R55 Syncope and collapse: Secondary | ICD-10-CM | POA: Diagnosis not present

## 2022-12-21 DIAGNOSIS — M1712 Unilateral primary osteoarthritis, left knee: Secondary | ICD-10-CM | POA: Diagnosis not present

## 2022-12-26 DIAGNOSIS — H4052X2 Glaucoma secondary to other eye disorders, left eye, moderate stage: Secondary | ICD-10-CM | POA: Diagnosis not present

## 2022-12-28 DIAGNOSIS — I471 Supraventricular tachycardia, unspecified: Secondary | ICD-10-CM | POA: Diagnosis not present

## 2022-12-28 DIAGNOSIS — M1712 Unilateral primary osteoarthritis, left knee: Secondary | ICD-10-CM | POA: Diagnosis not present

## 2023-01-10 ENCOUNTER — Encounter: Payer: Self-pay | Admitting: Nurse Practitioner

## 2023-01-10 ENCOUNTER — Ambulatory Visit: Payer: Medicare Other | Attending: Nurse Practitioner | Admitting: Nurse Practitioner

## 2023-01-10 VITALS — BP 130/72 | HR 60

## 2023-01-10 DIAGNOSIS — E785 Hyperlipidemia, unspecified: Secondary | ICD-10-CM | POA: Diagnosis not present

## 2023-01-10 DIAGNOSIS — R931 Abnormal findings on diagnostic imaging of heart and coronary circulation: Secondary | ICD-10-CM | POA: Insufficient documentation

## 2023-01-10 DIAGNOSIS — R002 Palpitations: Secondary | ICD-10-CM | POA: Diagnosis not present

## 2023-01-10 DIAGNOSIS — I10 Essential (primary) hypertension: Secondary | ICD-10-CM | POA: Diagnosis not present

## 2023-01-10 DIAGNOSIS — R0602 Shortness of breath: Secondary | ICD-10-CM | POA: Insufficient documentation

## 2023-01-10 DIAGNOSIS — I471 Supraventricular tachycardia, unspecified: Secondary | ICD-10-CM | POA: Diagnosis not present

## 2023-01-10 NOTE — Progress Notes (Addendum)
Office Visit    Patient Name: [name] Date of Encounter: 01/10/2023  Primary Care Provider:  Merri Brunette, MD Primary Cardiologist:  Nanetta Batty, MD  Chief Complaint    82 year old female with a history of abnormal EKG, elevated coronary calcium score, hypertension, hyperlipidemia, diverticulitis, and GERD who presents for follow-up related to CAD and palpitations.  Past Medical History    Past Medical History:  Diagnosis Date   Abnormal EKG    LVH with repolarization changes   Arthritis    Diverticulitis    GERD (gastroesophageal reflux disease)    Headache    migraines    Hyperlipidemia    Hyperlipidemia    Hypertension    Osteoporosis    Visual disorder    Torn retina   Past Surgical History:  Procedure Laterality Date   APPENDECTOMY     COLON SURGERY     DILATATION & CURETTAGE/HYSTEROSCOPY WITH MYOSURE N/A 05/11/2015   Procedure: DILATATION & CURETTAGE/HYSTEROSCOPY WITH MYOSURE POLYPECTOMY;  Surgeon: Maxie Better, MD;  Location: [facility];  Service: Gynecology;  Laterality: N/A;   EYE SURGERY     SHOULDER ARTHROSCOPY     TONSILLECTOMY      Allergies  Allergies  Allergen Reactions   Abreva [Docosanol] Swelling and Other (See Comments)    Top lip and side of face swollen   Codeine Nausea Only   Fosamax [Alendronate]     Other reaction(s): chest pain     Labs/Other Studies Reviewed    The following studies were reviewed today:  Cardiac Studies & Procedures       ECHOCARDIOGRAM  ECHOCARDIOGRAM COMPLETE 11/07/2017  Narrative *[facility]* [address] [place], [place] [address] [phone]  ------------------------------------------------------------------- Transthoracic Echocardiography  Patient:    Kim, Jimenez MR #:       [id number] Study Date: 11/07/2017 Gender:     F Age:        77 Height:     162.6 cm Weight:     67 kg BSA:        1.75 m^2 Pt. Status: Room:  SONOGRAPHER  Luvenia Redden,  RDCS ORDERING     Nanetta Batty, MD REFERRING    Nanetta Batty, MD ATTENDING    Thurmon Fair, MD PERFORMING   [facility]patient  cc:  ------------------------------------------------------------------- LV EF: 60% -   65%  ------------------------------------------------------------------- Indications:      Syncope (R55).  ------------------------------------------------------------------- History:   PMH:  Acquired from the patient and from the patient&'s chart.  Lightheadedness and syncope.  Risk factors:  Hypertension. Dyslipidemia.  ------------------------------------------------------------------- Study Conclusions  - Left ventricle: The cavity size was normal. Systolic function was normal. The estimated ejection fraction was in the range of 60% to 65%. Wall motion was normal; there were no regional wall motion abnormalities. Doppler parameters are consistent with abnormal left ventricular relaxation (grade 1 diastolic dysfunction). Doppler parameters are consistent with high ventricular filling pressure. - Aortic valve: There was mild regurgitation. - Left atrium: The atrium was mildly dilated.  ------------------------------------------------------------------- Study data:  Comparison was made to the study of June 2015.  Study status:  Routine.  Procedure:  The patient reported no pain pre or post test. Transthoracic echocardiography. Image quality was adequate.  Study completion:  There were no complications. Transthoracic echocardiography.  M-mode, complete 2D, spectral Doppler, and color Doppler.  Birthdate:  Patient birthdate: April 03, 1940.  Age:  Patient is 82 yr old.  Sex:  Gender: female. BMI: 25.4 kg/m^2.  Blood pressure:  132/70  Patient status: Outpatient.  Study date:  Study date: 11/07/2017. Study time: 10:33 AM.  Location:   [facility]  -------------------------------------------------------------------  ------------------------------------------------------------------- Left ventricle:  The cavity size was normal. Systolic function was normal. The estimated ejection fraction was in the range of 60% to 65%. Wall motion was normal; there were no regional wall motion abnormalities. Doppler parameters are consistent with abnormal left ventricular relaxation (grade 1 diastolic dysfunction). Doppler parameters are consistent with high ventricular filling pressure.  ------------------------------------------------------------------- Aortic valve:   Trileaflet; normal thickness leaflets. Mobility was not restricted.  Doppler:  Transvalvular velocity was within the normal range. There was no stenosis. There was mild regurgitation.  ------------------------------------------------------------------- Aorta:  Aortic root: The aortic root was normal in size.  ------------------------------------------------------------------- Mitral valve:   Structurally normal valve.   Mobility was not restricted.  Doppler:  Transvalvular velocity was within the normal range. There was no evidence for stenosis. There was no regurgitation.  ------------------------------------------------------------------- Left atrium:  The atrium was mildly dilated.  ------------------------------------------------------------------- Right ventricle:  The cavity size was normal. Wall thickness was normal. Systolic function was normal.  ------------------------------------------------------------------- Pulmonic valve:    Structurally normal valve.   Cusp separation was normal.  Doppler:  Transvalvular velocity was within the normal range. There was no evidence for stenosis. There was no regurgitation.  ------------------------------------------------------------------- Tricuspid valve:   Structurally normal valve.    Doppler: Transvalvular velocity  was within the normal range. There was mild regurgitation.  ------------------------------------------------------------------- Pulmonary artery:   The main pulmonary artery was normal-sized. Systolic pressure was within the normal range.  ------------------------------------------------------------------- Right atrium:  The atrium was normal in size.  ------------------------------------------------------------------- Pericardium:  There was no pericardial effusion.  ------------------------------------------------------------------- Systemic veins: Inferior vena cava: The vessel was normal in size.  ------------------------------------------------------------------- Measurements  Left ventricle                           Value        Reference LV ID, ED, PLAX chordal                  43.3  mm     43 - 52 LV ID, ES, PLAX chordal                  24.6  mm     23 - 38 LV fx shortening, PLAX chordal           43    %      >=29 LV PW thickness, ED                      8.91  mm     ---------- IVS/LV PW ratio, ED                      0.99         <=1.3 Stroke volume, 2D                        68    ml     ---------- Stroke volume/bsa, 2D                    39    ml/m^2 ---------- LV e&', lateral                           5.55  cm/s   ---------- LV E/e&', lateral                         9.53         ---------- LV e&', medial                            4.03  cm/s   ---------- LV E/e&', medial                          13.13        ---------- LV e&', average                           4.79  cm/s   ---------- LV E/e&', average                         11.04        ----------  Ventricular septum                       Value        Reference IVS thickness, ED                        8.81  mm     ----------  LVOT                                     Value        Reference LVOT ID, S                               20    mm     ---------- LVOT area                                3.14  cm^2    ---------- LVOT peak velocity, S                    82    cm/s   ---------- LVOT mean velocity, S                    50    cm/s   ---------- LVOT VTI, S                              21.5  cm     ----------  Aortic valve                             Value        Reference Aortic regurg pressure half-time         876   ms     ----------  Aorta                                    Value        Reference Aortic root ID, ED  31    mm     ----------  Left atrium                              Value        Reference LA ID, A-P, ES                           37    mm     ---------- LA ID/bsa, A-P                           2.11  cm/m^2 <=2.2 LA volume, S                             39.7  ml     ---------- LA volume/bsa, S                         22.7  ml/m^2 ---------- LA volume, ES, 1-p A4C                   51    ml     ---------- LA volume/bsa, ES, 1-p A4C               29.1  ml/m^2 ---------- LA volume, ES, 1-p A2C                   29.8  ml     ---------- LA volume/bsa, ES, 1-p A2C               17    ml/m^2 ----------  Mitral valve                             Value        Reference Mitral E-wave peak velocity              52.9  cm/s   ---------- Mitral A-wave peak velocity              64.5  cm/s   ---------- Mitral deceleration time                 215   ms     150 - 230 Mitral E/A ratio, peak                   0.8          ----------  Pulmonary arteries                       Value        Reference PA pressure, S, DP                       29    mm Hg  <=30  Tricuspid valve                          Value        Reference Tricuspid regurg peak velocity           255   cm/s   ---------- Tricuspid peak RV-RA gradient            26  mm Hg  ----------  Right atrium                             Value        Reference RA ID, S-I, ES, A4C                      44.9  mm     34 - 49 RA area, ES, A4C                         11.1  cm^2   8.3 - 19.5 RA volume, ES, A/L                        22.4  ml     ---------- RA volume/bsa, ES, A/L                   12.8  ml/m^2 ----------  Systemic veins                           Value        Reference Estimated CVP                            3     mm Hg  ----------  Right ventricle                          Value        Reference TAPSE                                    19.9  mm     ---------- RV pressure, S, DP                       29    mm Hg  <=30 RV s&', lateral, S                        12.9  cm/s   ----------  Legend: (L)  and  (H)  mark values outside specified reference range.  ------------------------------------------------------------------- Prepared and Electronically Authenticated by  Thurmon Fair, MD 2019-08-27T14:29:17    MONITORS  CARDIAC EVENT MONITOR 12/11/2017  Narrative Sinus rhythm/sinus bradycardia with one short 5 beat run of nonsustained ventricular tachycardia.   CT SCANS  CT CARDIAC SCORING (SELF PAY ONLY) 12/27/2021  Addendum 12/27/2021  1:50 PM ADDENDUM REPORT: 12/27/2021 13:48  EXAM: OVER-READ INTERPRETATION  CT CHEST  The following report is an over-read performed by radiologist Dr. Neita Garnet of [facility] on 12/27/2021. This over-read does not include interpretation of cardiac or coronary anatomy or pathology. The coronary calcium score interpretation by the cardiologist is attached.  COMPARISON:  Chest two views 11/25/2020  FINDINGS: Cardiovascular: There are no significant extracardiac vascular findings.  Mediastinum/Nodes: There are no enlarged lymph nodes within the visualized mediastinum.  Lungs/Pleura: There is no pleural effusion. There are two tiny benign calcified granulomas within the superior segment of the right lower lobe (axial series 2, image 13 and axial series 2, image 9). No follow-up imaging recommended.  Upper abdomen: No significant findings in the visualized upper abdomen.  Musculoskeletal/Chest wall: Moderate multilevel  disc space narrowing anterior endplate osteophytes within the visualized lower thoracic spine.  IMPRESSION: No significant extracardiac findings within the visualized chest.   Electronically Signed By: Neita Garnet M.D. On: 12/27/2021 13:48  Narrative CLINICAL DATA:  Cardiovascular Disease Risk stratification  EXAM: Coronary Calcium Score  MEDICATIONS: MEDICATIONS None  TECHNIQUE: A gated, non-contrast computed tomography scan of the heart was performed using 3mm slice thickness. Axial images were analyzed on a dedicated workstation. Calcium scoring of the coronary arteries was performed using the Agatston method.  FINDINGS: Coronary arteries: Normal origins.  Coronary Calcium Score:  Left main: 0  Left anterior descending artery: 101  Left circumflex artery: 0  Right coronary artery: 0  Total: 101  Percentile: 51st  Pericardium: Normal.  Ascending Aorta: Normal caliber.  Non-cardiac: See separate report from [facility].  IMPRESSION: Coronary calcium score of 101 Agatston units. This was 51st percentile for age-, race-, and sex-matched controls.  RECOMMENDATIONS: Coronary artery calcium (CAC) score is a strong predictor of incident coronary heart disease (CHD) and provides predictive information beyond traditional risk factors. CAC scoring is reasonable to use in the decision to withhold, postpone, or initiate statin therapy in intermediate-risk or selected borderline-risk asymptomatic adults (age 72-75 years and LDL-C >=70 to <190 mg/dL) who do not have diabetes or established atherosclerotic cardiovascular disease (ASCVD).* In intermediate-risk (10-year ASCVD risk >=7.5% to <20%) adults or selected borderline-risk (10-year ASCVD risk >=5% to <7.5%) adults in whom a CAC score is measured for the purpose of making a treatment decision the following recommendations have been made:  If CAC=0, it is reasonable to withhold statin therapy and  reassess in 5 to 10 years, as long as higher risk conditions are absent (diabetes mellitus, family history of premature CHD in first degree relatives (males <55 years; females <65 years), cigarette smoking, or LDL >=190 mg/dL).  If CAC is 1 to 99, it is reasonable to initiate statin therapy for patients >=65 years of age.  If CAC is >=100 or >=75th percentile, it is reasonable to initiate statin therapy at any age.  Cardiology referral should be considered for patients with CAC scores >=400 or >=75th percentile.  *2018 AHA/ACC/AACVPR/AAPA/ABC/ACPM/ADA/AGS/APhA/ASPC/NLA/PCNA Guideline on the Management of Blood Cholesterol: A Report of the American College of Cardiology/American Heart Association Task Force on Clinical Practice Guidelines. J Am Coll Cardiol. 2019;73(24):3168-3209.  Marca Ancona, MD  Electronically Signed: By: Marca Ancona M.D. On: 12/27/2021 13:12         Recent Labs: No results found for requested labs within last 365 days.  Recent Lipid Panel No results found for: "CHOL", "TRIG", "HDL", "CHOLHDL", "VLDL", "LDLCALC", "LDLDIRECT"  History of Present Illness    82 year old female with the above past medical history including abnormal EKG, elevated coronary calcium score, hypertension, hyperlipidemia, diverticulitis, and GERD.  She has a history of abnormal EKG (LVH with repolarization abnormality).  Echocardiogram was essentially normal in 2015 in 2019.  Cardiac monitor in the setting of presyncope showed sinus rhythm, sinus bradycardia, occasional PVCs, occasional short atrial runs.  Coronary calcium score per PCP in 12/2021 revealed coronary calcium score of 101 (51st percentile).  She was last seen in the office on 05/11/2022 and was doing well from a cardiac standpoint. She denied symptoms concerning for angina.  She presents today for follow-up.  Since her last visit she has been stable from a cardiac standpoint.  She notes that she saw her PCP in  September in the setting of frequent palpitations  with associated shortness of breath, presyncope.  She describes a "flipping" sensation.   She denies any chest pain, though she has noticed some generalized fatigue.  She wore a 2-week ZIO monitor per PCP and was told that she had SVT.  Unfortunately, monitor results are not available for review at today's visit.   She notes she was experiencing a significant amount of personal stress and anxiety during this time. Over the past several weeks her symptoms have improved though she still notes daily palpitations, no significant associated symptoms. Her palpitations will last for 1 to 2 minutes at a time and resolve spontaneously.  Other than her recent palpitations, she denies any additional concerns today.  Home Medications    Current Outpatient Medications  Medication Sig Dispense Refill   acetaminophen (TYLENOL) 500 MG tablet Take 1,000 mg by mouth every 6 (six) hours as needed for headache.     amLODipine (NORVASC) 5 MG tablet Take 1 tablet (5 mg total) by mouth daily. 90 tablet 3   aspirin EC 81 MG tablet Take 81 mg by mouth daily. Swallow whole.     atorvastatin (LIPITOR) 40 MG tablet Take 20 mg by mouth at bedtime.     CALCIUM CARBONATE-VITAMIN D PO Take 400 mg by mouth 3 (three) times daily.     cholecalciferol (VITAMIN D) 1000 UNITS tablet Take 1,000 Units by mouth daily.     ciclopirox (PENLAC) 8 % solution 1 application Externally Once a day for 90 days     dorzolamide-timolol (COSOPT) 22.3-6.8 MG/ML ophthalmic solution Place 1 drop into both eyes 2 (two) times daily.     folic acid (FOLVITE) 1 MG tablet Take 3 mg by mouth daily.     losartan-hydrochlorothiazide (HYZAAR) 100-25 MG tablet Take 1 tablet by mouth daily.     methotrexate (RHEUMATREX) 2.5 MG tablet Take 12.5 mg by mouth See admin instructions. Caution:Chemotherapy. Protect from light. Takes 4 tables weekly     Multiple Vitamins-Minerals (ICAPS MV PO) Take 1 tablet by mouth 2 (two)  times daily.     Multiple Vitamins-Minerals (ICAPS PO) Take 1 capsule by mouth 2 (two) times daily.     polyethylene glycol powder (MIRALAX) 17 GM/SCOOP powder Take by mouth. 1 teaspoon daily     Rimegepant Sulfate (NURTEC) 75 MG TBDP Take 75 mg by mouth daily as needed (migraine).     timolol (BETIMOL) 0.25 % ophthalmic solution Apply 1 drop to eye 2 (two) times daily.     triamcinolone ointment (KENALOG) 0.1 % 1 application to affected area Externally Twice a day as needed for 14 days     valACYclovir (VALTREX) 1000 MG tablet Take 1,000 mg by mouth daily.     Propylene Glycol (SYSTANE BALANCE OP) Place 1 drop into both eyes daily as needed (dry eyes). (Patient not taking: Reported on 01/10/2023)     No current facility-administered medications for this visit.     Review of Systems    She denies chest pain, dyspnea, pnd, orthopnea, n, v, dizziness, syncope, edema, weight gain, or early satiety. All other systems reviewed and are otherwise negative except as noted above.   Physical Exam    VS:  BP 130/72 (BP Location: Left Arm, Patient Position: Sitting, Cuff Size: Normal)   Pulse 60   SpO2 98%   GEN: Well nourished, well developed, in no acute distress. HEENT: normal. Neck: Supple, no JVD, carotid bruits, or masses. Cardiac: RRR, no murmurs, rubs, or gallops. No clubbing, cyanosis, edema.  Radials/DP/PT 2+  and equal bilaterally.  Respiratory:  Respirations regular and unlabored, clear to auscultation bilaterally. GI: Soft, nontender, nondistended, BS + x 4. MS: no deformity or atrophy. Skin: warm and dry, no rash. Neuro:  Strength and sensation are intact. Psych: Normal affect.  Accessory Clinical Findings    ECG personally reviewed by me today -    - no EKG in office today.   Lab Results  Component Value Date   WBC 7.9 11/25/2020   HGB 15.1 (H) 11/25/2020   HCT 45.2 11/25/2020   MCV 102.5 (H) 11/25/2020   PLT 224 11/25/2020   Lab Results  Component Value Date    CREATININE 0.86 11/25/2020   BUN 12 11/25/2020   NA 137 11/25/2020   K 4.0 11/25/2020   CL 103 11/25/2020   CO2 25 11/25/2020   Lab Results  Component Value Date   ALT 32 11/25/2020   AST 25 11/25/2020   ALKPHOS 79 11/25/2020   BILITOT 1.0 11/25/2020   No results found for: "CHOL", "HDL", "LDLCALC", "LDLDIRECT", "TRIG", "CHOLHDL"  No results found for: "HGBA1C"  Assessment & Plan   1. PSVT/palpitations/bradycardia: Per patient report, recent cardiac monitor per PCP revealed SVT.  Results are not available in office today.  In September she noticed frequent palpitations with associated shortness of breath, presyncope.  Her palpitations have since decreased in frequency.  She will note an occasional flipping sensation in her chest, denies any associated symptoms, palpitations were last for less than 2 minutes at a time.  She denies any further presyncope, denies syncope.  Will request monitor results from PCP.  Consider initiation of low-dose beta-blocker therapy.  She does have a history of bradycardia.  If she has significant SVT burden, or significant bradycardia, consider referral to EP.  Will update echocardiogram.  Reviewed ED precautions, vagal maneuvers.  Addendum 01/11/2023: Reviewed 14-day ZIO monitor results from Dr. Carolee Rota office (results are under media tab). Monitor revealed minimum heart rate 42 bpm, max heart rate 179 bpm, average heart rate 60 bpm.  Predominant underlying rhythm was sinus rhythm.  31 runs of SVT occurred, fastest interval lasting 18 beats, max rate 179 bpm, longest interval lasting 15 beats, average heart rate 99 bpm, rare PACs and PVCs.  Will start metoprolol tartrate 12.5 mg twice daily.  Continue to monitor BP/HR, symptoms.  2. Elevated coronary calcium score: Coronary calcium score per PCP in 12/2021 revealed coronary calcium score of 101 (51st percentile). Stable with no anginal symptoms. No indication for ischemic evaluation.  Continue aspirin,  losartan-HCTZ, amlodipine, and Lipitor.  3. Hypertension: BP well controlled. Continue current antihypertensive regimen.   4. Hyperlipidemia: LDL was 55 in 11/2022. Continue Lipitor.   5. Disposition: Follow-up in 6 weeks.      Joylene Grapes, NP 01/10/2023, 10:45 AM

## 2023-01-10 NOTE — Patient Instructions (Addendum)
Medication Instructions:  Your physician recommends that you continue on your current medications as directed. Please refer to the Current Medication list given to you today.  *If you need a refill on your cardiac medications before your next appointment, please call your pharmacy*   Lab Work: NONE ordered at this time of appointment     Testing/Procedures: Your physician has requested that you have an echocardiogram. Echocardiography is a painless test that uses sound waves to create images of your heart. It provides your doctor with information about the size and shape of your heart and how well your heart's chambers and valves are working. This procedure takes approximately one hour. There are no restrictions for this procedure. Please do NOT wear cologne, perfume, aftershave, or lotions (deodorant is allowed). Please arrive 15 minutes prior to your appointment time.    Follow-Up: At [facility], you and your health needs are our priority.  As part of our continuing mission to provide you with exceptional heart care, we have created designated Provider Care Teams.  These Care Teams include your primary Cardiologist (physician) and Advanced Practice Providers (APPs -  Physician Assistants and Nurse Practitioners) who all work together to provide you with the care you need, when you need it.  We recommend signing up for the patient portal called "MyChart".  Sign up information is provided on this After Visit Summary.  MyChart is used to connect with patients for Virtual Visits (Telemedicine).  Patients are able to view lab/test results, encounter notes, upcoming appointments, etc.  Non-urgent messages can be sent to your provider as well.   To learn more about what you can do with MyChart, go to [email or web address].    Your next appointment:   6 week(s)  Provider:   Bernadene Person, NP        Other Instructions Supraventricular Tachycardia, Adult Supraventricular  tachycardia (SVT) is a kind of abnormal heartbeat. It makes your heart beat very fast. This may last for just a short time. Or it may last longer and need treatment to get the heartbeat to go back to normal. A normal resting heartbeat is 60-100 times a minute. SVT can make your heart beat more than 150 times a minute.  The times when you have a fast heartbeat--or episodes of SVT--can be scary. But they're usually not dangerous. In some cases, SVT can lead to other heart problems. What are the causes?  SVT happens when electrical signals are sent out from areas of the upper part of the heart that don't normally send heartbeat signals. What increases the risk? You are more likely to get SVT if you are: Middle aged or older. Female. Other things that may increase your risk include: Having stress or feeling worried or nervous. Using illegal drugs such as cocaine or methamphetamine. Using over-the-counter cough or cold medicines. Stimulant drugs, such as caffeine. Smoking or alcohol use. Having any of these conditions: A thyroid condition. Diabetes. Obstructive sleep apnea. What are the signs or symptoms? A pounding heartbeat. Feeling fast or irregular heartbeats called palpitations. Shortness of breath. Weakness and tiredness. Tightness or pain in your chest. Feeling light-headed or dizzy. Feeling worried or nervous. Sometimes there are no symptoms. How is this diagnosed? This condition may be diagnosed based on: Your symptoms and a physical exam. Your health care provider will listen to your heart and feel your pulse. Tests. These may include: An electrocardiogram (ECG). This test checks for problems with electrical activity in the heart. A Holter monitor or  event monitor test. For this test, you'll wear a device that monitors your heart rate over time. An echocardiogram. This test uses sound waves to make a picture of your heart. A stress echocardiogram. An echocardiogram is done  when you are at rest and after exercise. Blood tests. An electrophysiology study (EPS). This tests the electrical activity in your heart. It helps find where the abnormal heart rhythm is coming from. How is this treated? Treatment may include: Vagal nerve stimulation. Doing things to stimulate a nerve called the vagus nerve can slow down the heartbeat. Work with your provider to find which technique works best for you. Ways to do this include: Holding your breath and pushing, as though you are pooping. Putting gentle pressure on the neck. Do not try this yourself. Only a provider should do this. If done the wrong way, it can lead to a stroke. Medicines that prevent attacks. Medicine to stop an attack. This is given through an IV at the hospital. Cardioversion. This uses a small electric shock to stop an attack. Catheter ablation. This is a procedure to get rid of cells in the area that's causing the fast heartbeats. If you don't have symptoms, you may not need treatment. Follow these instructions at home: Stress Avoid things that make you feel stressed. Find healthy ways to deal with stress, such as: Doing yoga or meditation. Going out in nature. Listening to relaxing music. Taking steps to be healthy, such as getting lots of sleep, exercising, and eating a balanced diet. Talking with a mental health provider. Lifestyle Try to get at least 7 hours of sleep each night. Do not smoke, vape, or use products with nicotine or tobacco in them. If you need help quitting, talk with your provider. Do not use illegal drugs. If you need help quitting, talk with your provider. Do not drink alcohol if it gives you a fast heartbeat. If alcohol does not seem to give you a fast heartbeat, limit your alcohol use. If you drink alcohol: Limit how much you have to: 0-1 drink a day if you are female. 0-2 drinks a day if you are female. Know how much alcohol is in your drink. In the [place] one drink is one 12  oz bottle of beer (355 mL), one 5 oz glass of wine (148 mL), or one 1 oz glass of hard liquor (44 mL). Be aware of how caffeine affects you. If caffeine gives you a fast heartbeat, do not eat, drink, or use anything with caffeine in it. If caffeine doesn't seem to give you a fast heartbeat, limit how much caffeine you have. General instructions Stay at a healthy weight. Exercise often. Ask your provider about good activities for you. Try one or a mixture of these: 150 minutes a week of gentle exercise, like walking or yoga. 75 minutes a week of exercise that is very active, like running or swimming. Do vagal nerve stimulation only as told by your provider. Take medicines only as told by your provider. Keep all follow-up visits. Your provider will want to make sure your treatments are working. Contact a health care provider if: You have a fast heartbeat more often. The feeling that your heart is beating fast lasts longer than before. Home treatments to slow down your heartbeat do not help. You have new symptoms. Get help right away if: You have chest pain. Your heart beats very fast for more than 20 minutes. You have trouble breathing. You faint. Your symptoms get worse. These  symptoms may be an emergency. Call 911 right away. Do not wait to see if the symptoms will go away. Do not drive yourself to the hospital. This information is not intended to replace advice given to you by your health care provider. Make sure you discuss any questions you have with your health care provider. Document Revised: 04/19/2022 Document Reviewed: 04/19/2022

## 2023-01-11 ENCOUNTER — Telehealth: Payer: Self-pay | Admitting: Nurse Practitioner

## 2023-01-11 NOTE — Telephone Encounter (Signed)
   Patient Name: [name]  DOB: 04/10/1940 MRN: [id number]  Primary Cardiologist: Nanetta Batty, MD  Please let patient know that I reviewed her monitor results.  It did show evidence of brief episodes of SVT. I would like to try to start her on metoprolol tartrate 12.5 mg twice daily. Continue to monitor BP/HR and follow-up as planned.  Thank you.-EM   Joylene Grapes, NP 01/11/2023, 4:55 PM

## 2023-01-13 ENCOUNTER — Telehealth: Payer: Self-pay

## 2023-01-13 MED ORDER — METOPROLOL TARTRATE 25 MG PO TABS: 12.5000 mg | ORAL_TABLET | Freq: Two times a day (BID) | ORAL | 3 refills | Status: DC

## 2023-01-13 NOTE — Telephone Encounter (Signed)
Spoke with pt. Pt was notified of monitor results and agrees to start metoprolol tartrate 12.5 mg twice daily. Medication sent to pts pharmacy.

## 2023-01-13 NOTE — Telephone Encounter (Signed)
Noted. Spoke with pt. Pt was notified of monitor results. RX metoprolol tartrate 12.5 mg twice daily sent to pts pharmacy.

## 2023-02-01 DIAGNOSIS — Z01419 Encounter for gynecological examination (general) (routine) without abnormal findings: Secondary | ICD-10-CM | POA: Diagnosis not present

## 2023-02-01 DIAGNOSIS — I10 Essential (primary) hypertension: Secondary | ICD-10-CM | POA: Diagnosis not present

## 2023-02-01 DIAGNOSIS — M810 Age-related osteoporosis without current pathological fracture: Secondary | ICD-10-CM | POA: Diagnosis not present

## 2023-02-01 DIAGNOSIS — I471 Supraventricular tachycardia, unspecified: Secondary | ICD-10-CM | POA: Diagnosis not present

## 2023-02-16 DIAGNOSIS — R600 Localized edema: Secondary | ICD-10-CM | POA: Diagnosis not present

## 2023-02-17 ENCOUNTER — Ambulatory Visit (HOSPITAL_COMMUNITY): Payer: Medicare Other | Attending: Cardiology

## 2023-02-17 DIAGNOSIS — I471 Supraventricular tachycardia, unspecified: Secondary | ICD-10-CM | POA: Insufficient documentation

## 2023-02-17 DIAGNOSIS — R0602 Shortness of breath: Secondary | ICD-10-CM | POA: Insufficient documentation

## 2023-02-17 DIAGNOSIS — R002 Palpitations: Secondary | ICD-10-CM | POA: Diagnosis not present

## 2023-02-17 LAB — ECHOCARDIOGRAM COMPLETE
Area-P 1/2: 4.44 cm2
P 1/2 time: 1226 ms
S' Lateral: 2.2 cm

## 2023-02-21 ENCOUNTER — Ambulatory Visit: Payer: Medicare Other | Admitting: Nurse Practitioner

## 2023-02-28 DIAGNOSIS — I10 Essential (primary) hypertension: Secondary | ICD-10-CM | POA: Diagnosis not present

## 2023-02-28 DIAGNOSIS — R600 Localized edema: Secondary | ICD-10-CM | POA: Diagnosis not present

## 2023-03-03 ENCOUNTER — Telehealth: Payer: Self-pay

## 2023-03-03 NOTE — Telephone Encounter (Signed)
Spoke with pt. Pt was notified echo results. Pt will continue current medication and f/u as planned.

## 2023-03-21 ENCOUNTER — Encounter: Payer: Self-pay | Admitting: Cardiovascular Disease

## 2023-03-21 ENCOUNTER — Other Ambulatory Visit: Payer: Self-pay | Admitting: Obstetrics and Gynecology

## 2023-03-21 ENCOUNTER — Ambulatory Visit: Payer: Medicare Other | Attending: Cardiovascular Disease | Admitting: Cardiovascular Disease

## 2023-03-21 VITALS — BP 118/70 | HR 52 | Ht 63.5 in | Wt 143.0 lb

## 2023-03-21 DIAGNOSIS — R002 Palpitations: Secondary | ICD-10-CM | POA: Insufficient documentation

## 2023-03-21 DIAGNOSIS — I10 Essential (primary) hypertension: Secondary | ICD-10-CM | POA: Insufficient documentation

## 2023-03-21 DIAGNOSIS — E782 Mixed hyperlipidemia: Secondary | ICD-10-CM | POA: Diagnosis not present

## 2023-03-21 DIAGNOSIS — R931 Abnormal findings on diagnostic imaging of heart and coronary circulation: Secondary | ICD-10-CM | POA: Diagnosis not present

## 2023-03-21 DIAGNOSIS — Z1231 Encounter for screening mammogram for malignant neoplasm of breast: Secondary | ICD-10-CM

## 2023-03-21 NOTE — Assessment & Plan Note (Signed)
 Elevated coronary calcium score 101 performed 12/27/2021.  Calcium was all in the LAD territory.  She is completely asymptomatic and is at goal with regards to her lipid profile for secondary prevention.

## 2023-03-21 NOTE — Progress Notes (Signed)
 03/21/2023 [name]   1940/10/10  [id number]  Primary Physician Clarice Nottingham, MD Primary Cardiologist: Dorn JINNY Lesches MD GENI CODY MADEIRA, FSCAI  HPI:  [name] is a 83 y.o.   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 She was referred by Dr. Dr. Nottingham Clarice at [facility]  medical initially for evaluation of generalized fatigue and an abnormal EKG. I last saw her in the office 05/11/2022.  Her cardiovascular risk factor profile is remarkable for hypertension, hyperlipidemia and family history. Her father had heart-related issues in the 25s and a brother did have a stent before he died of lung cancer in his 85s. She has never had a heart attack or stroke. She does complain of dyspnea but denies chest pain. She was referred for an abnormal EKG with ST segment changes.   She had a subsequent 2D echocardiogram after her office visit performed 08/14/2013 that was essentially normal as was an echo 11/07/2017.  Since I saw her 4 years ago she is been referred back for 6 episodes of presyncope which began several months ago.  She does feel palpitations during these and get some shortness of breath as well as weakness after they subside.  Holter monitor showed sinus rhythm consult sinus sinus bradycardia with occasional PVCs and occasional short atrial runs which do not appear to be contributory.     Since I saw her in the office a year ago she is remained stable.    She denies loss of breath.  She did have a coronary calcium  score performed 12/27/2021 which was 101 all of which was in the LAD territory.  She is completely asymptomatic.  Her most recent lipid profile performed by her PCP 11/18/2022 revealed total cholesterol of 140, LDL 55 and HDL 68.  She was begun on a low-dose of a beta-blocker for her palpitations and dizziness.  The symptoms have  resolved.   Current Meds  Medication Sig   acetaminophen  (TYLENOL ) 500 MG tablet Take 1,000 mg by mouth every 6 (six) hours as needed for headache.   amLODipine  (NORVASC ) 5 MG tablet Take 1 tablet (5 mg total) by mouth daily.   aspirin EC 81 MG tablet Take 81 mg by mouth daily. Swallow whole.   atorvastatin  (LIPITOR) 40 MG tablet Take 20 mg by mouth at bedtime.   CALCIUM  CARBONATE-VITAMIN D PO Take 400 mg by mouth 3 (three) times daily.   cholecalciferol (VITAMIN D) 1000 UNITS tablet Take 1,000 Units by mouth daily.   ciclopirox (PENLAC) 8 % solution 1 application Externally Once a day for 90 days   dorzolamide -timolol  (COSOPT ) 22.3-6.8 MG/ML ophthalmic solution Place 1 drop into both eyes 2 (two) times daily.   folic acid  (FOLVITE ) 1 MG tablet Take 3 mg by mouth daily.   losartan-hydrochlorothiazide (HYZAAR) 100-25 MG tablet Take 1 tablet by mouth daily.   methotrexate (RHEUMATREX) 2.5 MG tablet Take 12.5 mg by mouth See admin instructions. Caution:Chemotherapy. Protect from light. Takes 4 tables weekly   metoprolol  tartrate (LOPRESSOR ) 25 MG tablet Take 0.5 tablets (12.5 mg total) by mouth 2 (two) times daily.   Multiple Vitamins-Minerals (ICAPS MV PO) Take 1 tablet by mouth 2 (two) times daily.   Multiple Vitamins-Minerals (ICAPS PO) Take 1 capsule by mouth 2 (two) times daily.   polyethylene glycol powder (MIRALAX) 17 GM/SCOOP powder Take  by mouth. 1 teaspoon daily   Propylene Glycol (SYSTANE BALANCE OP) Place 1 drop into both eyes daily as needed (dry eyes).   Rimegepant Sulfate (NURTEC) 75 MG TBDP Take 75 mg by mouth daily as needed (migraine).   timolol  (BETIMOL ) 0.25 % ophthalmic solution Apply 1 drop to eye 2 (two) times daily.   triamcinolone  ointment (KENALOG) 0.1 % 1 application to affected area Externally Twice a day as needed for 14 days   valACYclovir  (VALTREX ) 1000 MG tablet Take 1,000 mg by mouth daily.     Allergies  Allergen Reactions   Abreva [Docosanol] Swelling and  Other (See Comments)    Top lip and side of face swollen   Codeine Nausea Only   Fosamax [Alendronate]     Other reaction(s): chest pain    Social History   Socioeconomic History   Marital status: Widowed    Spouse name: Not on file   Number of children: Not on file   Years of education: Not on file   Highest education level: Not on file  Occupational History   Not on file  Tobacco Use   Smoking status: Never   Smokeless tobacco: Never  Vaping Use   Vaping status: Never Used  Substance and Sexual Activity   Alcohol use: No   Drug use: No   Sexual activity: Not on file  Other Topics Concern   Not on file  Social History Narrative   Not on file   Social Drivers of Health   Financial Resource Strain: Not on file  Food Insecurity: Not on file  Transportation Needs: Not on file  Physical Activity: Not on file  Stress: Not on file  Social Connections: Not on file  Intimate Partner Violence: Not on file     Review of Systems: General: negative for chills, fever, night sweats or weight changes.  Cardiovascular: negative for chest pain, dyspnea on exertion, edema, orthopnea, palpitations, paroxysmal nocturnal dyspnea or shortness of breath Dermatological: negative for rash Respiratory: negative for cough or wheezing Urologic: negative for hematuria Abdominal: negative for nausea, vomiting, diarrhea, bright red blood per rectum, melena, or hematemesis Neurologic: negative for visual changes, syncope, or dizziness All other systems reviewed and are otherwise negative except as noted above.    Blood pressure 118/70, pulse (!) 52, height 5' 3.5 (1.613 m), weight 143 lb (64.9 kg), SpO2 98%.  General appearance: alert and no distress Neck: no adenopathy, no carotid bruit, no JVD, supple, symmetrical, trachea midline, and thyroid  not enlarged, symmetric, no tenderness/mass/nodules Lungs: clear to auscultation bilaterally Heart: regular rate and rhythm, S1, S2 normal, no  murmur, click, rub or gallop Extremities: extremities normal, atraumatic, no cyanosis or edema Pulses: 2+ and symmetric Skin: Skin color, texture, turgor normal. No rashes or lesions Neurologic: Grossly normal  EKG not performed today      ASSESSMENT AND PLAN:   Essential hypertension Essential hypertension her blood pressure measured today at 118/70.  She is on amlodipine , losartan, hydrochlorothiazide and low-dose metoprolol .  Hyperlipidemia History of hyperlipidemia on statin therapy with lipid profile performed 11/18/2022 revealing total cholesterol of 140, LDL of 55 and HDL of 68.  Elevated coronary artery calcium  score Elevated coronary calcium  score 101 performed 12/27/2021.  Calcium  was all in the LAD territory.  She is completely asymptomatic and is at goal with regards to her lipid profile for secondary prevention.  Palpitations History of palpitations and dizziness in the past.  Since beginning low-dose beta-blocker she no longer has the symptoms.  Dorn DOROTHA Lesches MD FACP,FACC,FAHA, [facility] 03/21/2023 3:49 PM

## 2023-03-21 NOTE — Assessment & Plan Note (Signed)
 History of hyperlipidemia on statin therapy with lipid profile performed 11/18/2022 revealing total cholesterol of 140, LDL of 55 and HDL of 68.

## 2023-03-21 NOTE — Assessment & Plan Note (Signed)
 History of palpitations and dizziness in the past.  Since beginning low-dose beta-blocker she no longer has the symptoms.

## 2023-03-21 NOTE — Patient Instructions (Signed)
Medication Instructions:  Your physician recommends that you continue on your current medications as directed. Please refer to the Current Medication list given to you today.  *If you need a refill on your cardiac medications before your next appointment, please call your pharmacy*   Follow-Up: At [facility], you and your health needs are our priority.  As part of our continuing mission to provide you with exceptional heart care, we have created designated Provider Care Teams.  These Care Teams include your primary Cardiologist (physician) and Advanced Practice Providers (APPs -  Physician Assistants and Nurse Practitioners) who all work together to provide you with the care you need, when you need it.  We recommend signing up for the patient portal called "MyChart".  Sign up information is provided on this After Visit Summary.  MyChart is used to connect with patients for Virtual Visits (Telemedicine).  Patients are able to view lab/test results, encounter notes, upcoming appointments, etc.  Non-urgent messages can be sent to your provider as well.   To learn more about what you can do with MyChart, go to [email or web address].    Your next appointment:   6 month(s)  Provider:   Diona Browner, NP      Then, Quay Burow, MD will plan to see you again in 12 month(s).

## 2023-03-21 NOTE — Assessment & Plan Note (Signed)
 Essential hypertension her blood pressure measured today at 118/70.  She is on amlodipine, losartan, hydrochlorothiazide and low-dose metoprolol.

## 2023-03-23 DIAGNOSIS — H6123 Impacted cerumen, bilateral: Secondary | ICD-10-CM | POA: Diagnosis not present

## 2023-03-27 DIAGNOSIS — M1712 Unilateral primary osteoarthritis, left knee: Secondary | ICD-10-CM | POA: Diagnosis not present

## 2023-03-27 DIAGNOSIS — M25562 Pain in left knee: Secondary | ICD-10-CM | POA: Diagnosis not present

## 2023-03-30 DIAGNOSIS — M1712 Unilateral primary osteoarthritis, left knee: Secondary | ICD-10-CM | POA: Diagnosis not present

## 2023-03-30 DIAGNOSIS — M25562 Pain in left knee: Secondary | ICD-10-CM | POA: Diagnosis not present

## 2023-03-31 DIAGNOSIS — H4052X2 Glaucoma secondary to other eye disorders, left eye, moderate stage: Secondary | ICD-10-CM | POA: Diagnosis not present

## 2023-03-31 DIAGNOSIS — H35372 Puckering of macula, left eye: Secondary | ICD-10-CM | POA: Diagnosis not present

## 2023-03-31 DIAGNOSIS — Z79899 Other long term (current) drug therapy: Secondary | ICD-10-CM | POA: Diagnosis not present

## 2023-03-31 DIAGNOSIS — H209 Unspecified iridocyclitis: Secondary | ICD-10-CM | POA: Diagnosis not present

## 2023-03-31 DIAGNOSIS — Z961 Presence of intraocular lens: Secondary | ICD-10-CM | POA: Diagnosis not present

## 2023-04-04 DIAGNOSIS — M1712 Unilateral primary osteoarthritis, left knee: Secondary | ICD-10-CM | POA: Diagnosis not present

## 2023-04-04 DIAGNOSIS — M25562 Pain in left knee: Secondary | ICD-10-CM | POA: Diagnosis not present

## 2023-04-06 DIAGNOSIS — M25562 Pain in left knee: Secondary | ICD-10-CM | POA: Diagnosis not present

## 2023-04-06 DIAGNOSIS — M1712 Unilateral primary osteoarthritis, left knee: Secondary | ICD-10-CM | POA: Diagnosis not present

## 2023-04-11 DIAGNOSIS — M1712 Unilateral primary osteoarthritis, left knee: Secondary | ICD-10-CM | POA: Diagnosis not present

## 2023-04-11 DIAGNOSIS — M25562 Pain in left knee: Secondary | ICD-10-CM | POA: Diagnosis not present

## 2023-04-18 DIAGNOSIS — M1712 Unilateral primary osteoarthritis, left knee: Secondary | ICD-10-CM | POA: Diagnosis not present

## 2023-04-18 DIAGNOSIS — M25562 Pain in left knee: Secondary | ICD-10-CM | POA: Diagnosis not present

## 2023-04-20 DIAGNOSIS — M1712 Unilateral primary osteoarthritis, left knee: Secondary | ICD-10-CM | POA: Diagnosis not present

## 2023-04-20 DIAGNOSIS — M25562 Pain in left knee: Secondary | ICD-10-CM | POA: Diagnosis not present

## 2023-04-24 ENCOUNTER — Ambulatory Visit
Admission: RE | Admit: 2023-04-24 | Discharge: 2023-04-24 | Disposition: A | Discharge status: Home / Self Care | Payer: Medicare Other | Source: Ambulatory Visit | Attending: Obstetrics and Gynecology | Admitting: Obstetrics and Gynecology

## 2023-04-24 DIAGNOSIS — Z1231 Encounter for screening mammogram for malignant neoplasm of breast: Secondary | ICD-10-CM | POA: Diagnosis not present

## 2023-04-25 DIAGNOSIS — M1712 Unilateral primary osteoarthritis, left knee: Secondary | ICD-10-CM | POA: Diagnosis not present

## 2023-04-25 DIAGNOSIS — M25562 Pain in left knee: Secondary | ICD-10-CM | POA: Diagnosis not present

## 2023-04-28 DIAGNOSIS — M25562 Pain in left knee: Secondary | ICD-10-CM | POA: Diagnosis not present

## 2023-04-28 DIAGNOSIS — M1712 Unilateral primary osteoarthritis, left knee: Secondary | ICD-10-CM | POA: Diagnosis not present

## 2023-05-01 DIAGNOSIS — H4052X2 Glaucoma secondary to other eye disorders, left eye, moderate stage: Secondary | ICD-10-CM | POA: Diagnosis not present

## 2023-05-02 DIAGNOSIS — H209 Unspecified iridocyclitis: Secondary | ICD-10-CM | POA: Diagnosis not present

## 2023-05-02 DIAGNOSIS — H4052X2 Glaucoma secondary to other eye disorders, left eye, moderate stage: Secondary | ICD-10-CM | POA: Diagnosis not present

## 2023-05-02 DIAGNOSIS — H35372 Puckering of macula, left eye: Secondary | ICD-10-CM | POA: Diagnosis not present

## 2023-05-02 DIAGNOSIS — Z961 Presence of intraocular lens: Secondary | ICD-10-CM | POA: Diagnosis not present

## 2023-05-02 DIAGNOSIS — Z79899 Other long term (current) drug therapy: Secondary | ICD-10-CM | POA: Diagnosis not present

## 2023-05-03 ENCOUNTER — Ambulatory Visit: Payer: Medicare Other | Admitting: Cardiovascular Disease

## 2023-05-05 DIAGNOSIS — M1712 Unilateral primary osteoarthritis, left knee: Secondary | ICD-10-CM | POA: Diagnosis not present

## 2023-08-18 ENCOUNTER — Other Ambulatory Visit: Payer: Self-pay | Admitting: Cardiovascular Disease

## 2023-08-22 DIAGNOSIS — H209 Unspecified iridocyclitis: Secondary | ICD-10-CM | POA: Diagnosis not present

## 2023-08-22 DIAGNOSIS — H35372 Puckering of macula, left eye: Secondary | ICD-10-CM | POA: Diagnosis not present

## 2023-08-22 DIAGNOSIS — Z961 Presence of intraocular lens: Secondary | ICD-10-CM | POA: Diagnosis not present

## 2023-08-22 DIAGNOSIS — Z79899 Other long term (current) drug therapy: Secondary | ICD-10-CM | POA: Diagnosis not present

## 2023-08-22 DIAGNOSIS — H4052X2 Glaucoma secondary to other eye disorders, left eye, moderate stage: Secondary | ICD-10-CM | POA: Diagnosis not present

## 2023-08-29 DIAGNOSIS — Z79899 Other long term (current) drug therapy: Secondary | ICD-10-CM | POA: Diagnosis not present

## 2023-08-29 DIAGNOSIS — H209 Unspecified iridocyclitis: Secondary | ICD-10-CM | POA: Diagnosis not present

## 2023-09-06 DIAGNOSIS — H4052X2 Glaucoma secondary to other eye disorders, left eye, moderate stage: Secondary | ICD-10-CM | POA: Diagnosis not present

## 2023-09-27 ENCOUNTER — Ambulatory Visit: Attending: Nurse Practitioner | Admitting: Nurse Practitioner

## 2023-09-27 ENCOUNTER — Encounter: Payer: Self-pay | Admitting: Nurse Practitioner

## 2023-09-27 VITALS — BP 122/72 | HR 41 | Ht 63.5 in | Wt 137.0 lb

## 2023-09-27 DIAGNOSIS — E785 Hyperlipidemia, unspecified: Secondary | ICD-10-CM | POA: Insufficient documentation

## 2023-09-27 DIAGNOSIS — I471 Supraventricular tachycardia, unspecified: Secondary | ICD-10-CM | POA: Insufficient documentation

## 2023-09-27 DIAGNOSIS — R002 Palpitations: Secondary | ICD-10-CM | POA: Insufficient documentation

## 2023-09-27 DIAGNOSIS — I10 Essential (primary) hypertension: Secondary | ICD-10-CM | POA: Diagnosis not present

## 2023-09-27 DIAGNOSIS — R931 Abnormal findings on diagnostic imaging of heart and coronary circulation: Secondary | ICD-10-CM | POA: Diagnosis not present

## 2023-09-27 MED ORDER — METOPROLOL TARTRATE 25 MG PO TABS: 12.5000 mg | ORAL_TABLET | Freq: Two times a day (BID) | ORAL | 3 refills | Status: DC | PRN

## 2023-09-27 NOTE — Patient Instructions (Signed)
 Medication Instructions:  Decrease Metoprolol  Tartrate 12.5 mg twice daily as needed for palpitations. Please hold if heart rate is less than 50 bpm.  *If you need a refill on your cardiac medications before your next appointment, please call your pharmacy*  Lab Work: NONE ordered at this time of appointment   Testing/Procedures: NONE ordered at this time of appointment   Follow-Up: At [facility], you and your health needs are our priority.  As part of our continuing mission to provide you with exceptional heart care, our providers are all part of one team.  This team includes your primary Cardiologist (physician) and Advanced Practice Providers or APPs (Physician Assistants and Nurse Practitioners) who all work together to provide you with the care you need, when you need it.  Your next appointment:   4-6 week(s)  Provider:   Damien Braver, NP          We recommend signing up for the patient portal called MyChart.  Sign up information is provided on this After Visit Summary.  MyChart is used to connect with patients for Virtual Visits (Telemedicine).  Patients are able to view lab/test results, encounter notes, upcoming appointments, etc.  Non-urgent messages can be sent to your provider as well.   To learn more about what you can do with MyChart, go to [email or web address].

## 2023-09-27 NOTE — Progress Notes (Signed)
 Office Visit    Patient Name: [name] Date of Encounter: 09/27/2023  Primary Care Provider:  Clarice Nottingham, MD Primary Cardiologist:  Dorn Lesches, MD  Chief Complaint    83 year old female with a history of aabnormal EKG, elevated coronary calcium  score, PSVT, PACs, PVCs, hypertension, hyperlipidemia, diverticulitis, and GERD who presents for follow-up related to CAD and palpitations.  Past Medical History    Past Medical History:  Diagnosis Date   Abnormal EKG    LVH with repolarization changes   Arthritis    Diverticulitis    GERD (gastroesophageal reflux disease)    Headache    migraines    Hyperlipidemia    Hyperlipidemia    Hypertension    Osteoporosis    Visual disorder    Torn retina   Past Surgical History:  Procedure Laterality Date   APPENDECTOMY     COLON SURGERY     DILATATION & CURETTAGE/HYSTEROSCOPY WITH MYOSURE N/A 05/11/2015   Procedure: DILATATION & CURETTAGE/HYSTEROSCOPY WITH MYOSURE POLYPECTOMY;  Surgeon: Dickie Carder, MD;  Location: [facility];  Service: Gynecology;  Laterality: N/A;   EYE SURGERY     SHOULDER ARTHROSCOPY     TONSILLECTOMY      Allergies  Allergies  Allergen Reactions   Abreva [Docosanol] Swelling and Other (See Comments)    Top lip and side of face swollen   Codeine Nausea Only   Fosamax [Alendronate]     Other reaction(s): chest pain     Labs/Other Studies Reviewed    The following studies were reviewed today:  Cardiac Studies & Procedures   ______________________________________________________________________________________________     ECHOCARDIOGRAM  ECHOCARDIOGRAM COMPLETE 02/17/2023  Narrative ECHOCARDIOGRAM REPORT    Patient Name:   [name] Date of Exam: 02/17/2023 Medical Rec #:  [id number]        Height:       63.0 in Accession #:    [id number]       Weight:       154.4 lb Date of Birth:  1940-11-28        BSA:          1.732 m Patient Age:    82 years         BP:            130/72 mmHg Patient Gender: F                HR:           50 bpm. Exam Location:  [address]  Procedure: 2D Echo, Cardiac Doppler, Color Doppler, 3D Echo and Strain Analysis  Indications:    R00.2 Palpitations; I47.1 SVT  History:        Patient has prior history of Echocardiogram examinations, most recent 11/07/2017. CAD, Abnormal ECG, Signs/Symptoms:Shortness of Breath; Risk Factors:Hypertension and Dyslipidemia. Palpitations.  Sonographer:    Jon Hacker RCS Referring Phys: [phone] Jordain Radin C Diane Hanel  IMPRESSIONS   1. Left ventricular ejection fraction, by estimation, is 65 to 70%. Left ventricular ejection fraction by 3D volume is 70 %. The left ventricle has normal function. The left ventricle has no regional wall motion abnormalities. There is mild asymmetric left ventricular hypertrophy of the basal-septal segment. Left ventricular diastolic parameters are indeterminate. The average left ventricular global longitudinal strain is -21.6 %. 2. Right ventricular systolic function is normal. The right ventricular size is normal. There is normal pulmonary artery systolic pressure. The estimated right ventricular systolic pressure is 30.9 mmHg. 3. The mitral valve is  normal in structure. Trivial mitral valve regurgitation. No evidence of mitral stenosis. 4. Tricuspid valve regurgitation is mild to moderate. 5. The aortic valve is tricuspid. Aortic valve regurgitation is mild. No aortic stenosis is present. 6. The inferior vena cava is normal in size with greater than 50% respiratory variability, suggesting right atrial pressure of 3 mmHg.  FINDINGS Left Ventricle: Left ventricular ejection fraction, by estimation, is 65 to 70%. Left ventricular ejection fraction by 3D volume is 70 %. The left ventricle has normal function. The left ventricle has no regional wall motion abnormalities. The average left ventricular global longitudinal strain is -21.6 %. The left ventricular internal cavity  size was normal in size. There is mild asymmetric left ventricular hypertrophy of the basal-septal segment. Left ventricular diastolic parameters are indeterminate.  Right Ventricle: The right ventricular size is normal. No increase in right ventricular wall thickness. Right ventricular systolic function is normal. There is normal pulmonary artery systolic pressure. The tricuspid regurgitant velocity is 2.64 m/s, and with an assumed right atrial pressure of 3 mmHg, the estimated right ventricular systolic pressure is 30.9 mmHg.  Left Atrium: Left atrial size was normal in size.  Right Atrium: Right atrial size was normal in size.  Pericardium: There is no evidence of pericardial effusion.  Mitral Valve: The mitral valve is normal in structure. Trivial mitral valve regurgitation. No evidence of mitral valve stenosis.  Tricuspid Valve: The tricuspid valve is normal in structure. Tricuspid valve regurgitation is mild to moderate.  Aortic Valve: The aortic valve is tricuspid. Aortic valve regurgitation is mild. Aortic regurgitation PHT measures 1226 msec. No aortic stenosis is present.  Pulmonic Valve: The pulmonic valve was not well visualized. Pulmonic valve regurgitation is trivial.  Aorta: The aortic root and ascending aorta are structurally normal, with no evidence of dilitation.  Venous: The inferior vena cava is normal in size with greater than 50% respiratory variability, suggesting right atrial pressure of 3 mmHg.  IAS/Shunts: The interatrial septum was not well visualized.   LEFT VENTRICLE PLAX 2D LVIDd:         4.30 cm         Diastology LVIDs:         2.20 cm         LV e' medial:    5.44 cm/s LV PW:         0.80 cm         LV E/e' medial:  14.8 LV IVS:        0.90 cm         LV e' lateral:   9.68 cm/s LVOT diam:     2.00 cm         LV E/e' lateral: 8.3 LV SV:         76 LV SV Index:   44              2D LVOT Area:     3.14 cm        Longitudinal Strain 2D Strain GLS   -21.6 % Avg:  3D Volume EF LV 3D EF:    Left ventricul ar ejection fraction by 3D volume is 70 %.  3D Volume EF: 3D EF:        70 % LV EDV:       109 ml LV ESV:       33 ml LV SV:        76 ml  RIGHT VENTRICLE RV Basal diam:  3.00 cm RV  S prime:     15.40 cm/s RVSP:           30.9 mmHg  LEFT ATRIUM             Index        RIGHT ATRIUM           Index LA diam:        3.70 cm 2.14 cm/m   RA Pressure: 3.00 mmHg LA Vol (A2C):   43.8 ml 25.28 ml/m  RA Area:     14.70 cm LA Vol (A4C):   40.9 ml 23.61 ml/m  RA Volume:   33.10 ml  19.11 ml/m LA Biplane Vol: 45.2 ml 26.09 ml/m AORTIC VALVE LVOT Vmax:   117.00 cm/s LVOT Vmean:  65.200 cm/s LVOT VTI:    0.241 m AI PHT:      1226 msec  AORTA Ao Root diam: 2.80 cm Ao Asc diam:  3.60 cm  MITRAL VALVE               TRICUSPID VALVE MV Area (PHT): 4.44 cm    TR Peak grad:   27.9 mmHg MV Decel Time: 171 msec    TR Vmax:        264.00 cm/s MV E velocity: 80.70 cm/s  Estimated RAP:  3.00 mmHg MV A velocity: 71.30 cm/s  RVSP:           30.9 mmHg MV E/A ratio:  1.13 SHUNTS Systemic VTI:  0.24 m Systemic Diam: 2.00 cm  Lonni Nanas MD Electronically signed by Lonni Nanas MD Signature Date/Time: 02/17/2023/1:41:50 PM    Final    MONITORS  CARDIAC EVENT MONITOR 12/11/2017  Narrative Sinus rhythm/sinus bradycardia with one short 5 beat run of nonsustained ventricular tachycardia.   CT SCANS  CT CARDIAC SCORING (SELF PAY ONLY) 12/27/2021  Addendum 12/27/2021  1:50 PM ADDENDUM REPORT: 12/27/2021 13:48  EXAM: OVER-READ INTERPRETATION  CT CHEST  The following report is an over-read performed by radiologist Dr. Tanda Lyons of [facility] on 12/27/2021. This over-read does not include interpretation of cardiac or coronary anatomy or pathology. The coronary calcium  score interpretation by the cardiologist is attached.  COMPARISON:  Chest two views  11/25/2020  FINDINGS: Cardiovascular: There are no significant extracardiac vascular findings.  Mediastinum/Nodes: There are no enlarged lymph nodes within the visualized mediastinum.  Lungs/Pleura: There is no pleural effusion. There are two tiny benign calcified granulomas within the superior segment of the right lower lobe (axial series 2, image 13 and axial series 2, image 9). No follow-up imaging recommended.  Upper abdomen: No significant findings in the visualized upper abdomen.  Musculoskeletal/Chest wall: Moderate multilevel disc space narrowing anterior endplate osteophytes within the visualized lower thoracic spine.  IMPRESSION: No significant extracardiac findings within the visualized chest.   Electronically Signed By: Tanda Lyons M.D. On: 12/27/2021 13:48  Narrative CLINICAL DATA:  Cardiovascular Disease Risk stratification  EXAM: Coronary Calcium  Score  MEDICATIONS: MEDICATIONS None  TECHNIQUE: A gated, non-contrast computed tomography scan of the heart was performed using 3mm slice thickness. Axial images were analyzed on a dedicated workstation. Calcium  scoring of the coronary arteries was performed using the Agatston method.  FINDINGS: Coronary arteries: Normal origins.  Coronary Calcium  Score:  Left main: 0  Left anterior descending artery: 101  Left circumflex artery: 0  Right coronary artery: 0  Total: 101  Percentile: 51st  Pericardium: Normal.  Ascending Aorta: Normal caliber.  Non-cardiac: See separate report from [facility].  IMPRESSION:  Coronary calcium  score of 101 Agatston units. This was 51st percentile for age-, race-, and sex-matched controls.  RECOMMENDATIONS: Coronary artery calcium  (CAC) score is a strong predictor of incident coronary heart disease (CHD) and provides predictive information beyond traditional risk factors. CAC scoring is reasonable to use in the decision to withhold, postpone, or  initiate statin therapy in intermediate-risk or selected borderline-risk asymptomatic adults (age 31-75 years and LDL-C >=70 to <190 mg/dL) who do not have diabetes or established atherosclerotic cardiovascular disease (ASCVD).* In intermediate-risk (10-year ASCVD risk >=7.5% to <20%) adults or selected borderline-risk (10-year ASCVD risk >=5% to <7.5%) adults in whom a CAC score is measured for the purpose of making a treatment decision the following recommendations have been made:  If CAC=0, it is reasonable to withhold statin therapy and reassess in 5 to 10 years, as long as higher risk conditions are absent (diabetes mellitus, family history of premature CHD in first degree relatives (males <55 years; females <65 years), cigarette smoking, or LDL >=190 mg/dL).  If CAC is 1 to 99, it is reasonable to initiate statin therapy for patients >=75 years of age.  If CAC is >=100 or >=75th percentile, it is reasonable to initiate statin therapy at any age.  Cardiology referral should be considered for patients with CAC scores >=400 or >=75th percentile.  *2018 AHA/ACC/AACVPR/AAPA/ABC/ACPM/ADA/AGS/APhA/ASPC/NLA/PCNA Guideline on the Management of Blood Cholesterol: A Report of the American College of Cardiology/American Heart Association Task Force on Clinical Practice Guidelines. J Am Coll Cardiol. 2019;73(24):3168-3209.  Ezra Shuck, MD  Electronically Signed: By: Ezra Shuck M.D. On: 12/27/2021 13:12     ______________________________________________________________________________________________     Recent Labs: No results found for requested labs within last 365 days.  Recent Lipid Panel No results found for: CHOL, TRIG, HDL, CHOLHDL, VLDL, LDLCALC, LDLDIRECT  History of Present Illness    83 year old female with the above past medical history including abnormal EKG, elevated coronary calcium  score, PSVT, PACs, PVCs, hypertension, hyperlipidemia,  diverticulitis, and GERD.   She has a history of abnormal EKG (LVH with repolarization abnormality).  Echocardiogram was essentially normal in 2015 in 2019.  Cardiac monitor in the setting of presyncope showed sinus rhythm, sinus bradycardia, occasional PVCs, occasional short atrial runs.  Coronary calcium  score per PCP in 12/2021 revealed coronary calcium  score of 101 (51st percentile). 14-day ZIO monitor in 12/2022 revealed minimum heart rate 42 bpm, max heart rate 179 bpm, average heart rate 60 bpm.  Predominant underlying rhythm was sinus rhythm.  31 runs of SVT occurred, fastest interval lasting 18 beats, max rate 179 bpm, longest interval lasting 15 beats, average heart rate 99 bpm, rare PACs and PVCs.  She was started on metoprolol .  She was last seen in the office on 03/21/2023 and was stable from a cardiac standpoint.  She denied any further palpitations.   She presents today for follow-up. Since her last visit she has been stable from a cardiac standpoint though over the past 2 months she reports significant fatigue. Denies any chest pain, palpitations, dizziness, presyncope, syncope, dyspnea, edema, PND, with apnea, weight gain. Other than her recent fatigue, she reports feeling well.    Home Medications    Current Outpatient Medications  Medication Sig Dispense Refill   acetaminophen  (TYLENOL ) 500 MG tablet Take 1,000 mg by mouth every 6 (six) hours as needed for headache.     amLODipine  (NORVASC ) 5 MG tablet TAKE 1 TABLET(5 MG) BY MOUTH DAILY 90 tablet 3   aspirin EC 81 MG tablet Take  81 mg by mouth daily. Swallow whole.     atorvastatin  (LIPITOR) 40 MG tablet Take 20 mg by mouth at bedtime.     CALCIUM  CARBONATE-VITAMIN D PO Take 400 mg by mouth 3 (three) times daily.     cholecalciferol (VITAMIN D) 1000 UNITS tablet Take 1,000 Units by mouth daily.     ciclopirox (PENLAC) 8 % solution 1 application Externally Once a day for 90 days     dorzolamide -timolol  (COSOPT ) 22.3-6.8 MG/ML  ophthalmic solution Place 1 drop into both eyes 2 (two) times daily.     folic acid  (FOLVITE ) 1 MG tablet Take 3 mg by mouth daily.     losartan-hydrochlorothiazide (HYZAAR) 100-25 MG tablet Take 1 tablet by mouth daily.     methotrexate (RHEUMATREX) 2.5 MG tablet Take 5 mg by mouth.     Multiple Vitamins-Minerals (ICAPS MV PO) Take 1 tablet by mouth 2 (two) times daily.     Multiple Vitamins-Minerals (ICAPS PO) Take 1 capsule by mouth 2 (two) times daily.     polyethylene glycol powder (MIRALAX) 17 GM/SCOOP powder Take by mouth. 1 teaspoon daily     Propylene Glycol (SYSTANE BALANCE OP) Place 1 drop into both eyes daily as needed (dry eyes).     Rimegepant Sulfate (NURTEC) 75 MG TBDP Take 75 mg by mouth daily as needed (migraine).     timolol  (BETIMOL ) 0.25 % ophthalmic solution Apply 1 drop to eye 2 (two) times daily.     triamcinolone  ointment (KENALOG) 0.1 % 1 application to affected area Externally Twice a day as needed for 14 days     valACYclovir  (VALTREX ) 1000 MG tablet Take 1,000 mg by mouth daily.     metoprolol  tartrate (LOPRESSOR ) 25 MG tablet Take 0.5 tablets (12.5 mg total) by mouth 2 (two) times daily as needed. 12.5 mg twice daily as needed for palpitations. Please hold if heart rate is less than 50 bpm. 90 tablet 3   No current facility-administered medications for this visit.     Review of Systems    She denies chest pain, palpitations, dyspnea, pnd, orthopnea, n, v, dizziness, syncope, edema, weight gain, or early satiety. All other systems reviewed and are otherwise negative except as noted above.   Physical Exam    VS:  BP 122/72   Pulse (!) 41   Ht 5' 3.5 (1.613 m)   Wt 137 lb (62.1 kg)   SpO2 99%   BMI 23.89 kg/m   GEN: Well nourished, well developed, in no acute distress. HEENT: normal. Neck: Supple, no JVD, carotid bruits, or masses. Cardiac: RRR, no murmurs, rubs, or gallops. No clubbing, cyanosis, edema.  Radials/DP/PT 2+ and equal bilaterally.   Respiratory:  Respirations regular and unlabored, clear to auscultation bilaterally. GI: Soft, nontender, nondistended, BS + x 4. MS: no deformity or atrophy. Skin: warm and dry, no rash. Neuro:  Strength and sensation are intact. Psych: Normal affect.  Accessory Clinical Findings    ECG personally reviewed by me today - EKG Interpretation Date/Time:  Wednesday September 27 2023 10:24:51 EDT Ventricular Rate:  41 PR Interval:  168 QRS Duration:  86 QT Interval:  442 QTC Calculation: 364 R Axis:   48  Text Interpretation: Marked sinus bradycardia When compared with ECG of 26-Nov-2020 03:15, Vent. rate has decreased BY  22 BPM Nonspecific T wave abnormality no longer evident in Lateral leads QT has shortened Confirmed by Daneen Perkins ([id number]) on 09/27/2023 10:42:14 AM  - no acute changes.   Lab Results  Component Value Date   WBC 7.9 11/25/2020   HGB 15.1 (H) 11/25/2020   HCT 45.2 11/25/2020   MCV 102.5 (H) 11/25/2020   PLT 224 11/25/2020   Lab Results  Component Value Date   CREATININE 0.86 11/25/2020   BUN 12 11/25/2020   NA 137 11/25/2020   K 4.0 11/25/2020   CL 103 11/25/2020   CO2 25 11/25/2020   Lab Results  Component Value Date   ALT 32 11/25/2020   AST 25 11/25/2020   ALKPHOS 79 11/25/2020   BILITOT 1.0 11/25/2020   No results found for: CHOL, HDL, LDLCALC, LDLDIRECT, TRIG, CHOLHDL  No results found for: HGBA1C  Assessment & Plan   1. PSVT/palpitations/bradycardia: 14-day ZIO monitor in 12/2022 revealed minimum heart rate 42 bpm, max heart rate 179 bpm, average heart rate 60 bpm.  Predominant underlying rhythm was sinus rhythm.  31 runs of SVT occurred, fastest interval lasting 18 beats, max rate 179 bpm, longest interval lasting 15 beats, average heart rate 99 bpm, rare PACs and PVCs.  She was started on metoprolol  with significant improvement in her symptoms.  She reports 2-3 month history of significant fatigue. She denies any palpitations,  dizziness, presyncope or syncope.  EKG today shows sinus bradycardia, HR 41 bpm. Suspect bradycardia is contributing to fatigue. Will stop daily metoprolol . She may take metoprolol  tartrate 12.5 mg twice daily as needed for palpitations. Should she have increasing palpitations, she will likely require EP referral due to inability to tolerate beta-blocker therapy in the setting of bradycardia. Continue to monitor symptoms. Consider repeat cardiac monitor at follow-up should she have ongoing bradycardia, increased palpitations. Reviewed ED precautions.    2. Elevated coronary calcium  score: Coronary calcium  score per PCP in 12/2021 revealed coronary calcium  score of 101 (51st percentile). Stable with no anginal symptoms. No indication for ischemic evaluation.  Continue aspirin, losartan-HCTZ, amlodipine , and Lipitor.   3. Hypertension: BP initially elevated in office today, improved with recheck.  Generally well controlled. Continue current antihypertensive regimen.    4. Hyperlipidemia: LDL was 55 in 11/2022.  Repeat fasting lipids at follow-up.  Continue Lipitor.    5. Disposition: Follow-up in 4 to 6 weeks, sooner if needed.      Damien JAYSON Braver, NP 09/27/2023, 11:59 AM

## 2023-10-02 DIAGNOSIS — H52223 Regular astigmatism, bilateral: Secondary | ICD-10-CM | POA: Diagnosis not present

## 2023-10-02 DIAGNOSIS — H524 Presbyopia: Secondary | ICD-10-CM | POA: Diagnosis not present

## 2023-10-25 ENCOUNTER — Telehealth: Payer: Self-pay | Admitting: Cardiovascular Disease

## 2023-10-25 NOTE — Telephone Encounter (Signed)
 Pt c/o medication issue:  1. Name of Medication: metoprolol  tartrate (LOPRESSOR ) 25 MG tablet   2. How are you currently taking this medication (dosage and times per day)? As written   3. Are you having a reaction (difficulty breathing--STAT)? No   4. What is your medication issue? Pt would like to know if she should still take this medication with her BP being 119/70?

## 2023-10-25 NOTE — Telephone Encounter (Signed)
 Spoke with pt and she stated that she had been having palpitations earlier but her BP was in the mid 90s systolic. She stated she waited a little bit and rechecked her BP and that is when it went to 119/70 so she took the PRN Metoprolol  dose. Advised pt to continue the hold parameters that Damien had previously given for HR and also added for the pt to not take Metoprolol  if SBP is less than 100. Advised pt that if she is having palpitations but HR and SBP are too low, to call our office for recommendations. Pt verbalized understanding of plan and had no further questions at this time.

## 2023-11-01 DIAGNOSIS — H6123 Impacted cerumen, bilateral: Secondary | ICD-10-CM | POA: Diagnosis not present

## 2023-11-04 IMAGING — CT CT CERVICAL SPINE W/O CM
3 series · 11 of 33 positions shown, 13 images · non-contrast
Comparison: None Available.

CLINICAL DATA: Fall, trauma



[Series 4: c spine soft (person_name) · axial · 0.39mm/px · z∈[-241,-145]mm · 3 of 79 slices shown, 4 images]
[im 19/79  soft-tissue]
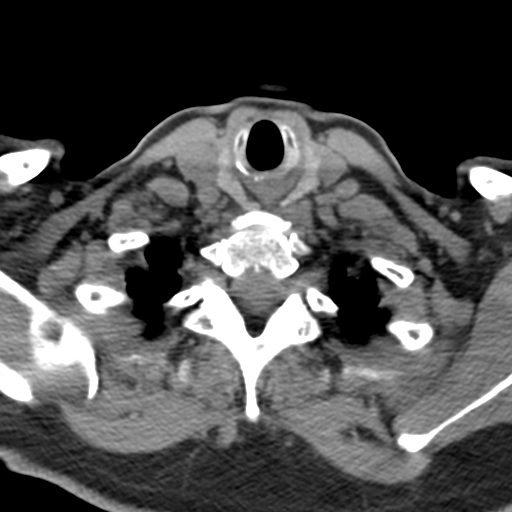
[im 19/79  bone]
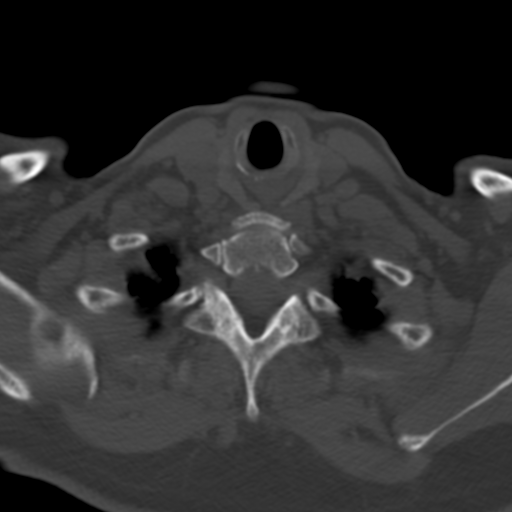
[im 43/79  bone]
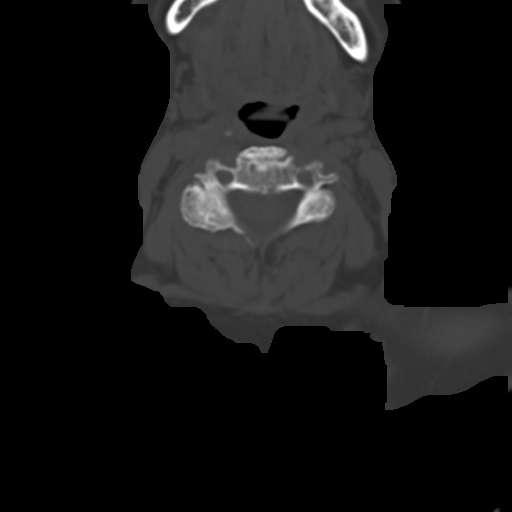
[im 67/79  bone]
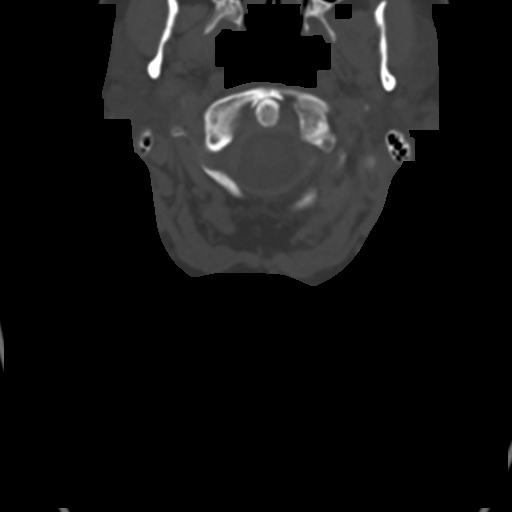

[Series 5: cor bone · coronal · 0.38mm/px · 3 of 55 slices shown]
[im 13/55  bone]
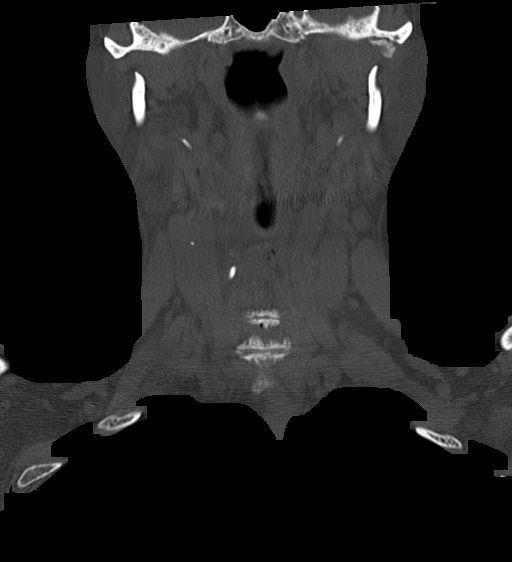
[im 23/55  bone]
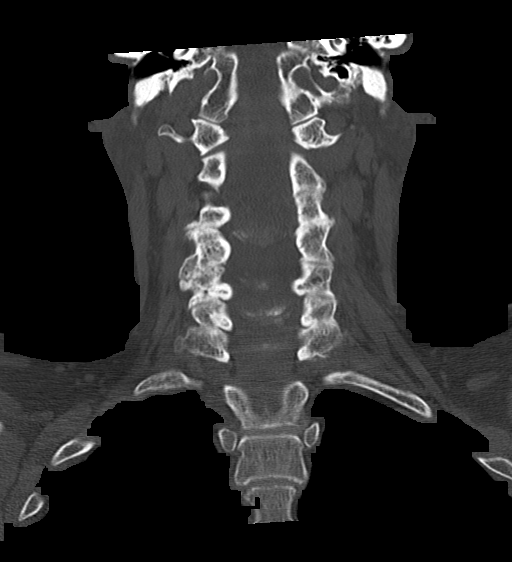
[im 33/55  bone]
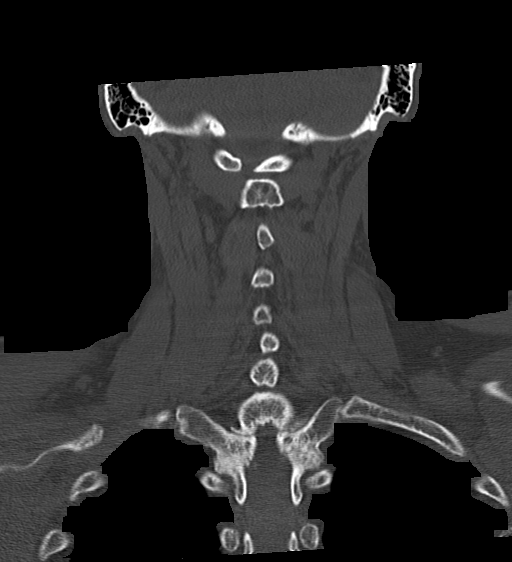

[Series 6: sag bone · sagittal · 0.21mm/px · 5 of 73 slices shown, 6 images]
[im 25/73  bone]
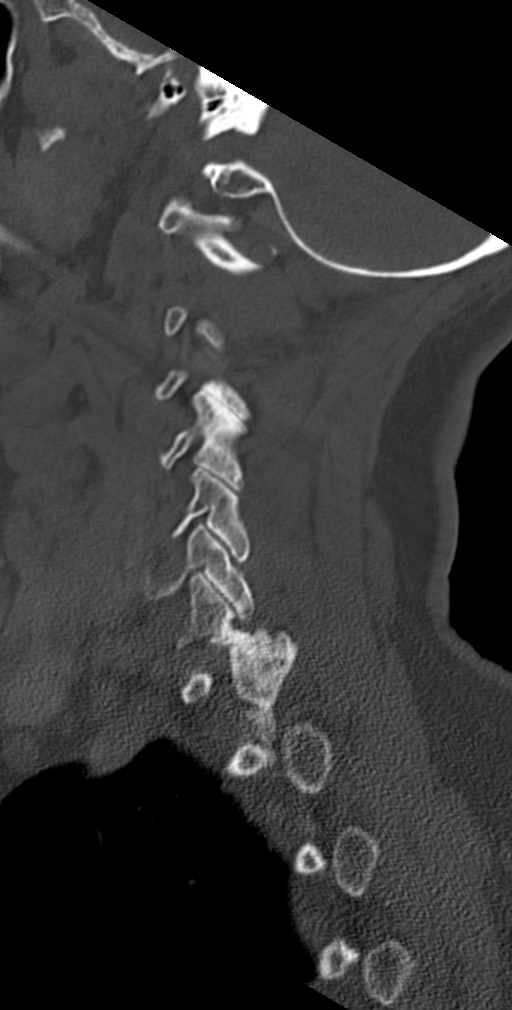
[im 31/73  bone]
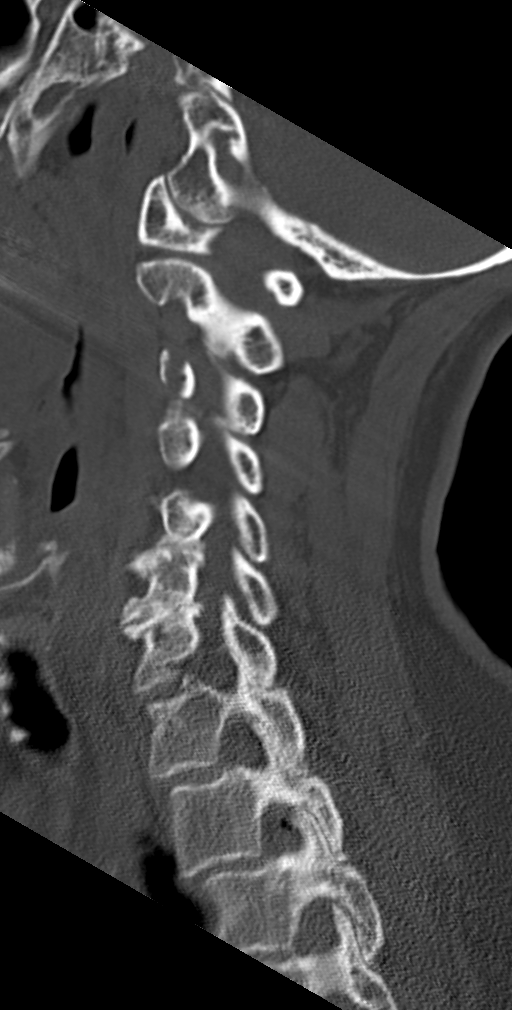
[im 37/73  soft-tissue]
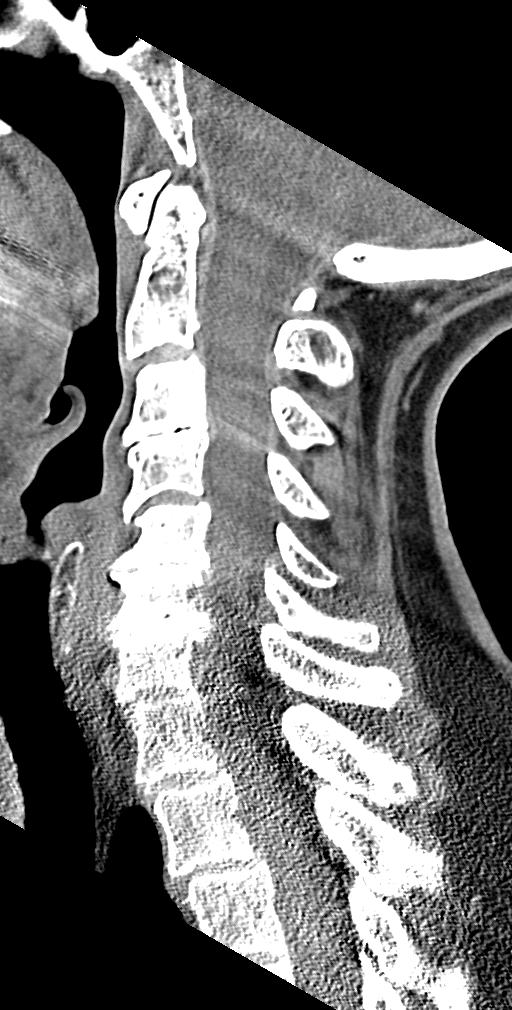
[im 37/73  bone]
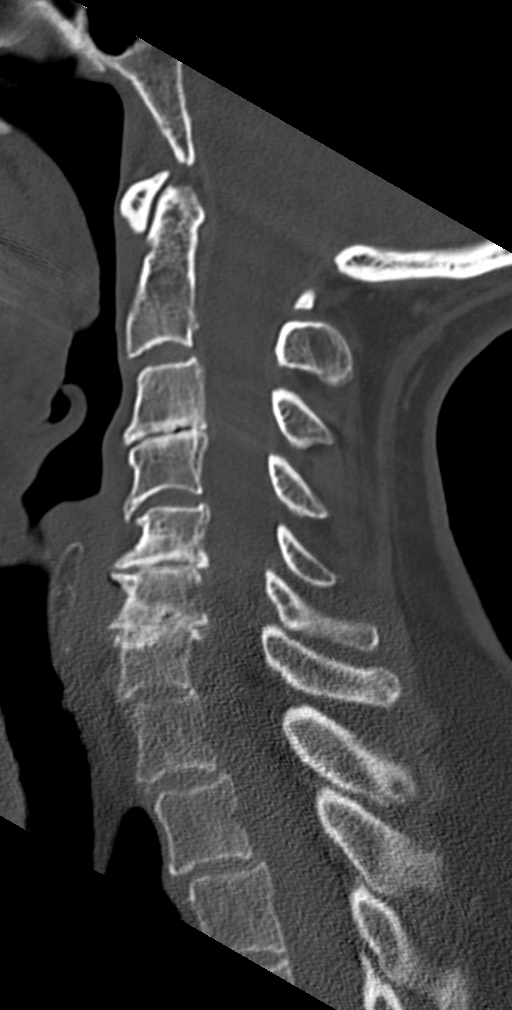
[im 43/73  bone]
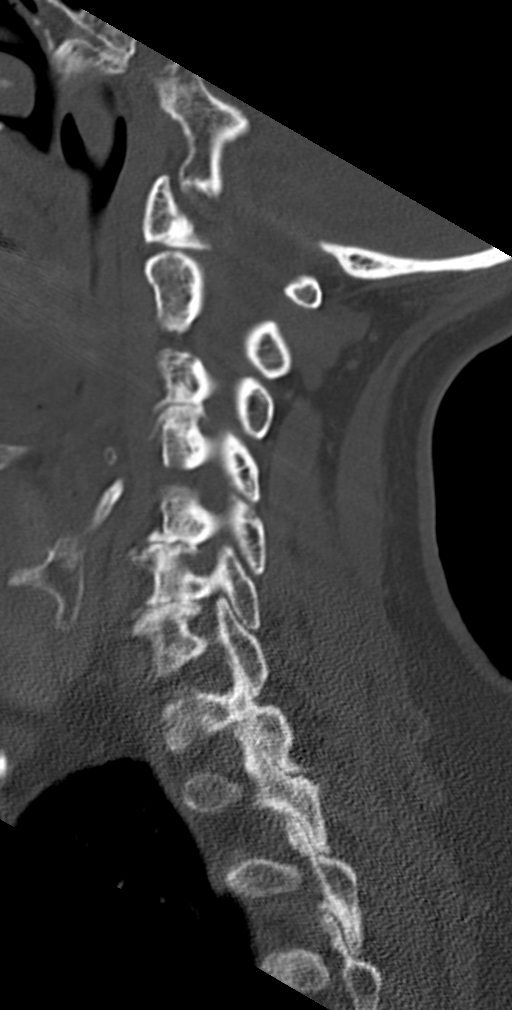
[im 49/73  bone]
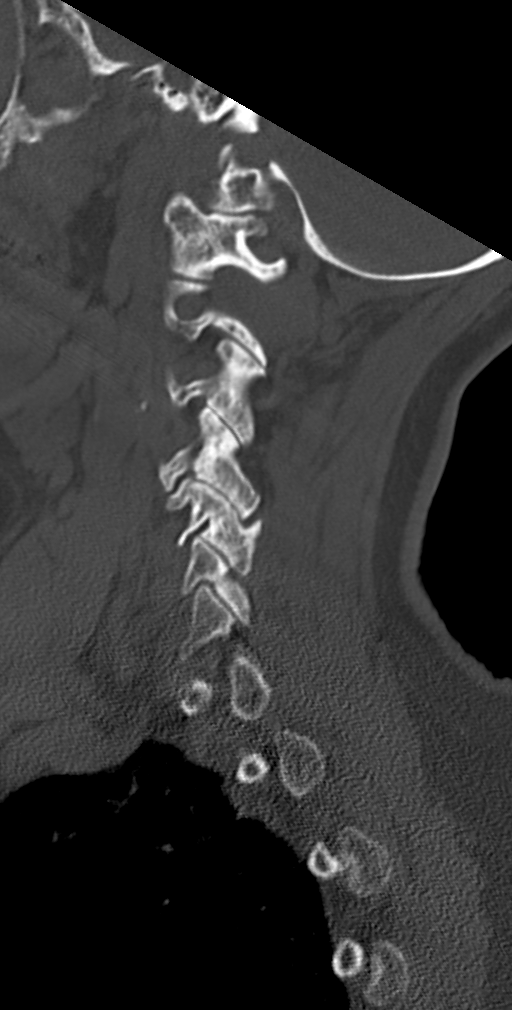

[11 of 33 positions shown; findings below may reference images not displayed]

FINDINGS: Alignment: Slight reversal of the normal lordotic curvature of the
cervical spine, likely chronic. Minimal grade 1 anterolisthesis of
C4 on C5. No acute subluxation visualized.

Skull base and vertebrae: No acute fracture. No primary bone lesion
or focal pathologic process.

Soft tissues and spinal canal: No prevertebral fluid or swelling. No
visible canal hematoma.

Disc levels: Severe intervertebral disc space narrowing at C3-C4,
C5-C6 and C6-C7. Associated endplate sclerosis and osteophytes.
Multilevel facet arthropathy, right worse than left.

Upper chest: No acute process visualized.

Other: None.
IMPRESSION: 1. No acute fracture or malalignment identified in the cervical
spine.
2. Multilevel degenerative changes.

## 2023-11-04 IMAGING — CT CT HEAD W/O CM
4 series · 16 of 47 positions shown, 18 images · non-contrast
Comparison: None Available.

CLINICAL DATA: Fall, trauma



[Series 2: head wo · axial · 0.41mm/px · z∈[-121,-1]mm · 7 of 33 slices shown, 9 images]
[im 5/33  brain]
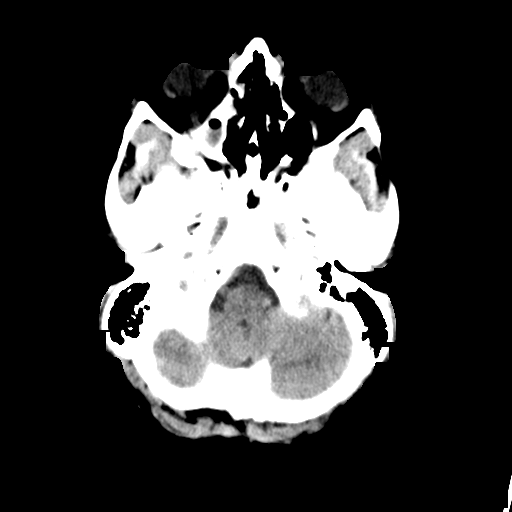
[im 5/33  bone]
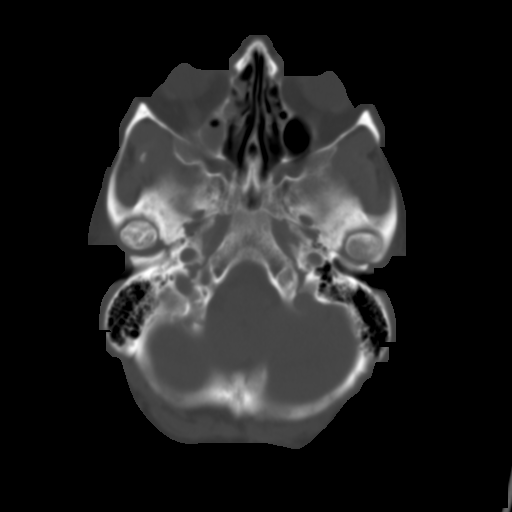
[im 9/33  brain]
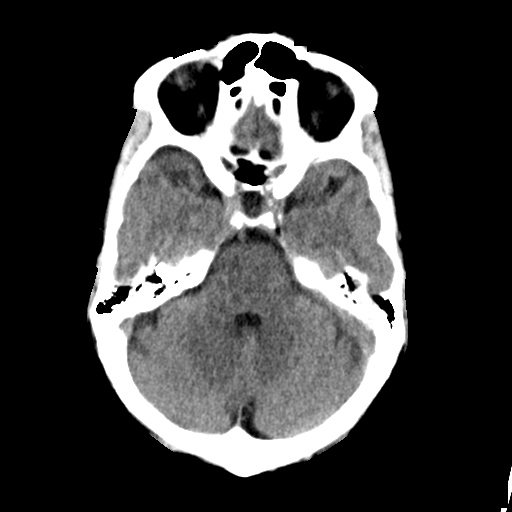
[im 13/33  brain]
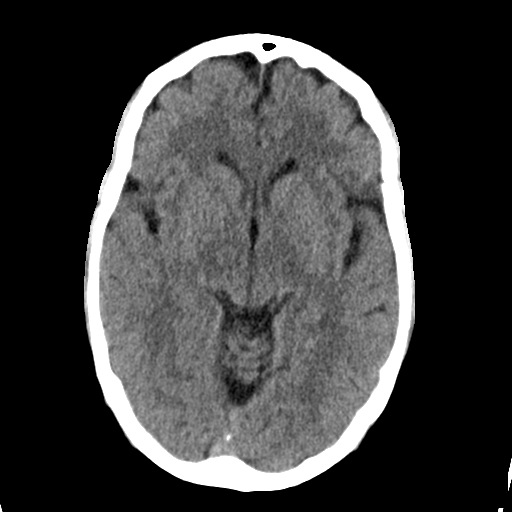
[im 17/33  brain]
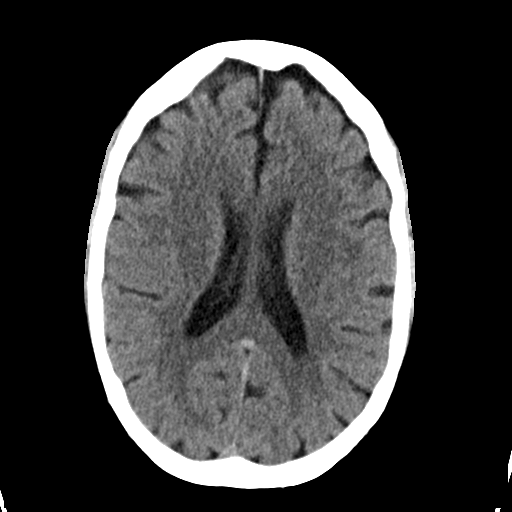
[im 21/33  brain]
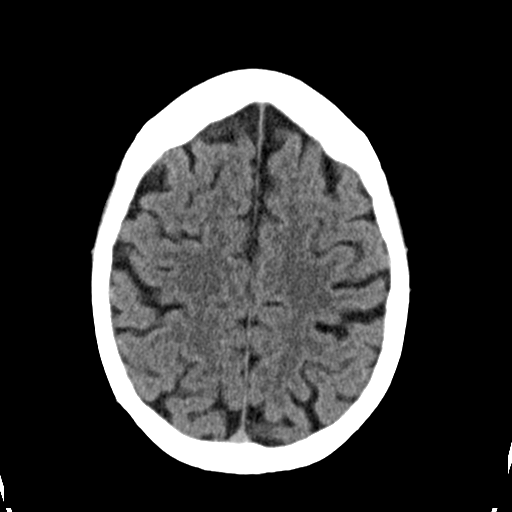
[im 21/33  bone]
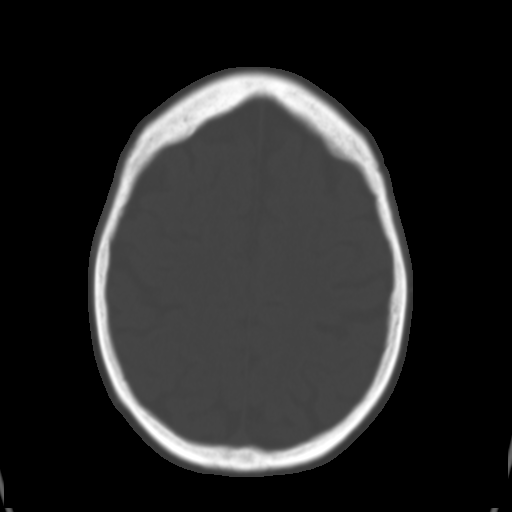
[im 25/33  brain]
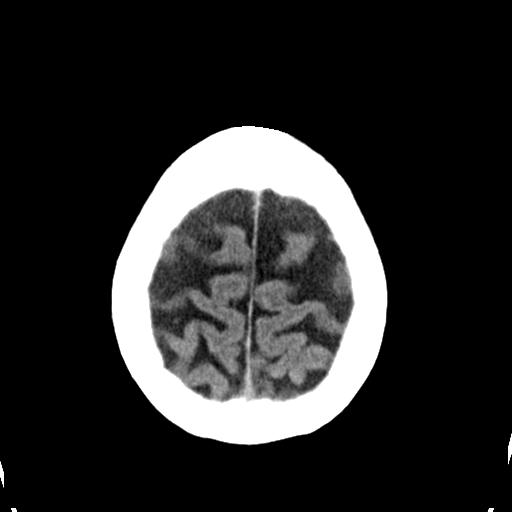
[im 29/33  brain]
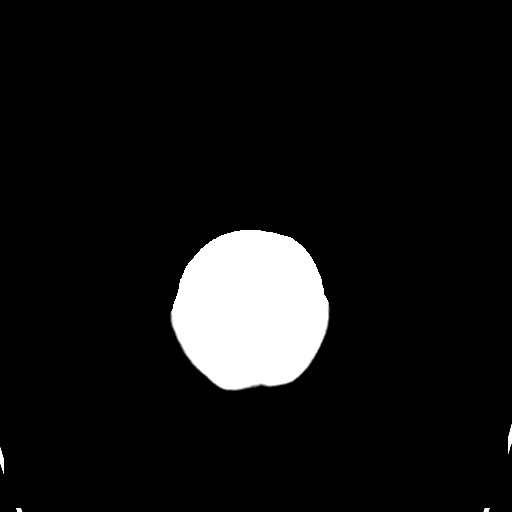

[Series 3: head bone · axial · 0.41mm/px · z∈[-125,-93]mm · 3 of 81 slices shown]
[im 9/81  bone]
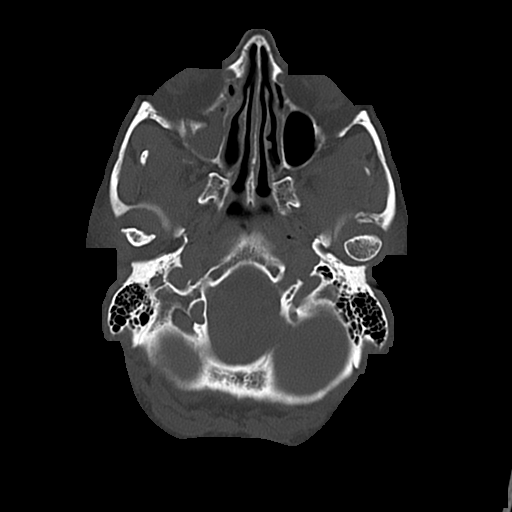
[im 17/81  bone]
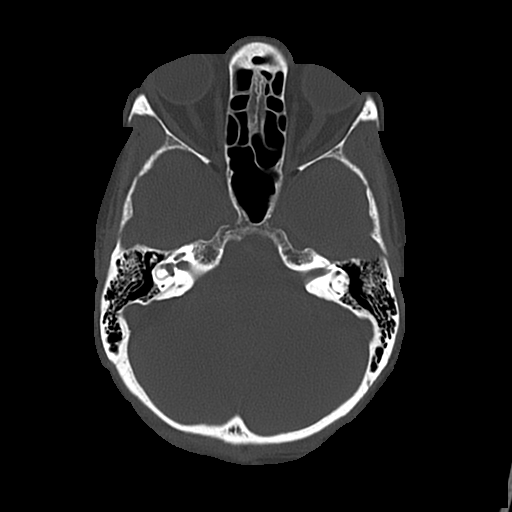
[im 25/81  bone]
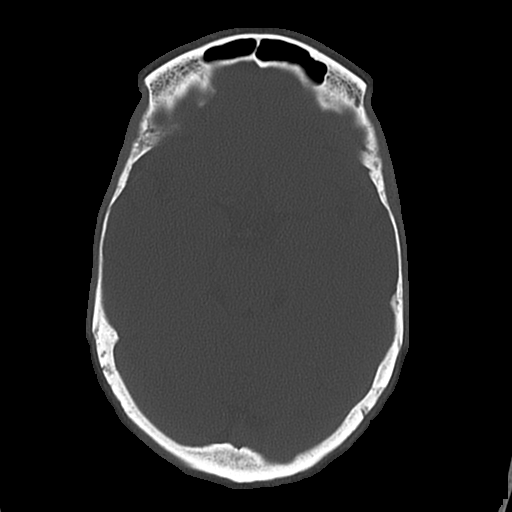

[Series 4: coronal soft · coronal · 0.30mm/px · 3 of 67 slices shown]
[im 23/67  brain]
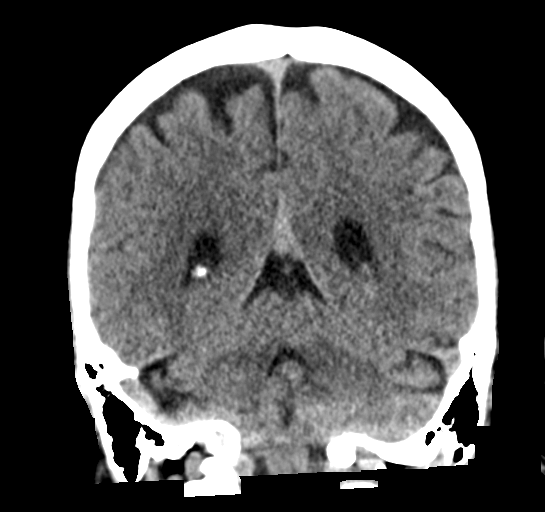
[im 30/67  brain]
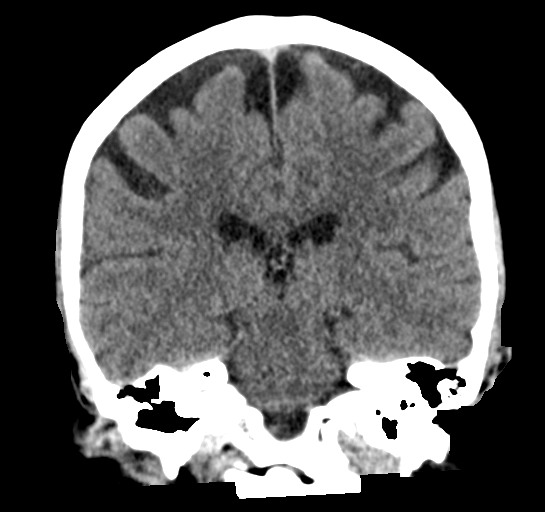
[im 37/67  brain]
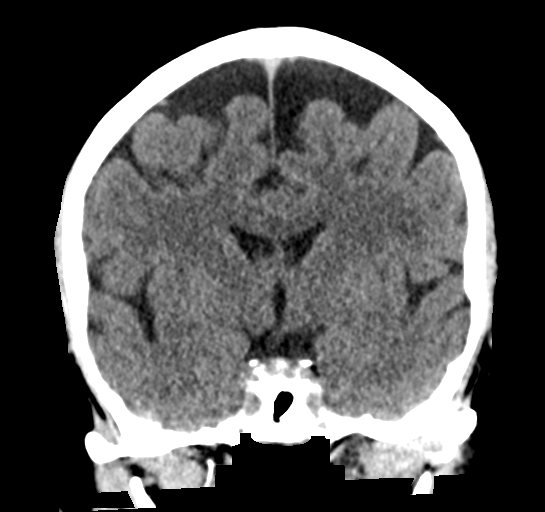

[Series 5: sagittal soft · sagittal · 0.30mm/px · 3 of 55 slices shown]
[im 19/55  brain]
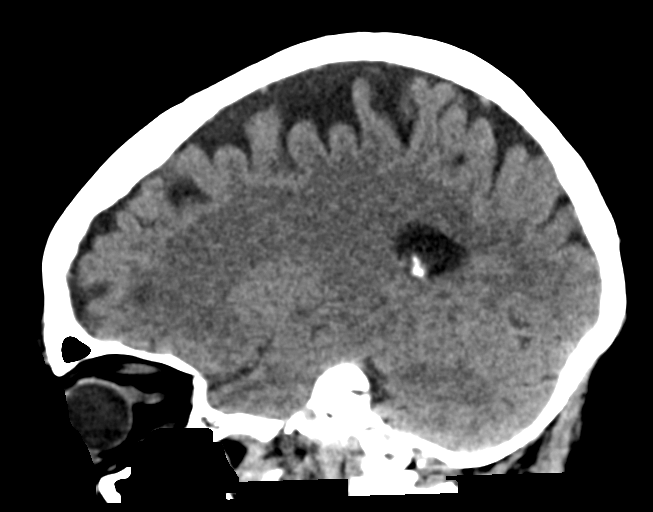
[im 28/55  brain]
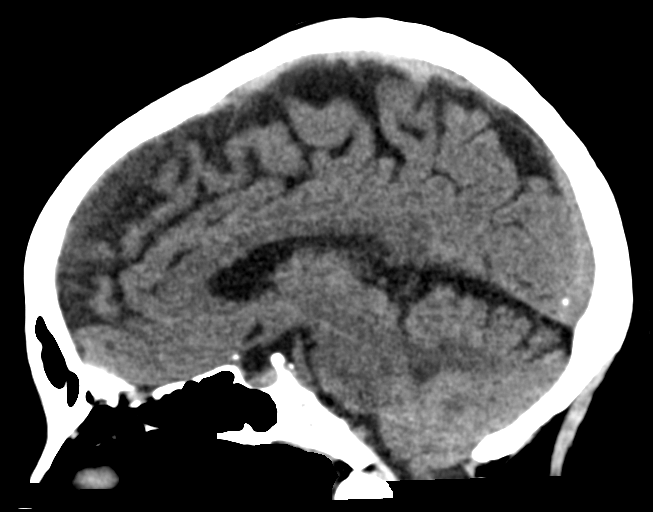
[im 37/55  brain]
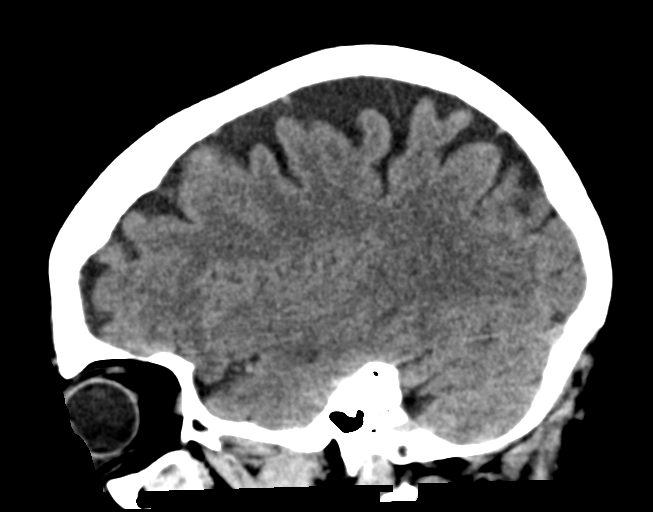

[16 of 47 positions shown; findings below may reference images not displayed]

FINDINGS: Brain: No acute intracranial hemorrhage, mass effect, or herniation.
No extra-axial fluid collections. No evidence of acute territorial
infarct. No hydrocephalus. Moderate cortical volume loss. Mild
patchy hypodensities in the periventricular and subcortical white
matter, likely secondary to chronic microvascular ischemic changes.

Vascular: No hyperdense vessel or unexpected calcification.

Skull: Intact with no acute fracture visualized.

Sinuses/Orbits: There is an acute minimally displaced fracture of
the inferior wall right orbit with associated blood products
opacifying the right maxillary sinus.

Other: None.
IMPRESSION: 1. No acute intracranial process identified.
2. Partially visualized acute fracture of the inferior wall right
orbit, with associated blood products opacifying the right maxillary
sinus.

## 2023-11-06 DIAGNOSIS — M1712 Unilateral primary osteoarthritis, left knee: Secondary | ICD-10-CM | POA: Diagnosis not present

## 2023-11-07 ENCOUNTER — Encounter: Payer: Self-pay | Admitting: Nurse Practitioner

## 2023-11-07 ENCOUNTER — Ambulatory Visit (INDEPENDENT_AMBULATORY_CARE_PROVIDER_SITE_OTHER)

## 2023-11-07 ENCOUNTER — Ambulatory Visit: Attending: Nurse Practitioner | Admitting: Nurse Practitioner

## 2023-11-07 VITALS — BP 130/82 | HR 49 | Ht 63.0 in | Wt 138.0 lb

## 2023-11-07 DIAGNOSIS — R931 Abnormal findings on diagnostic imaging of heart and coronary circulation: Secondary | ICD-10-CM | POA: Insufficient documentation

## 2023-11-07 DIAGNOSIS — I10 Essential (primary) hypertension: Secondary | ICD-10-CM | POA: Diagnosis not present

## 2023-11-07 DIAGNOSIS — E785 Hyperlipidemia, unspecified: Secondary | ICD-10-CM | POA: Diagnosis not present

## 2023-11-07 DIAGNOSIS — I471 Supraventricular tachycardia, unspecified: Secondary | ICD-10-CM | POA: Insufficient documentation

## 2023-11-07 DIAGNOSIS — R001 Bradycardia, unspecified: Secondary | ICD-10-CM

## 2023-11-07 DIAGNOSIS — R002 Palpitations: Secondary | ICD-10-CM | POA: Diagnosis not present

## 2023-11-07 MED ORDER — METOPROLOL SUCCINATE ER 25 MG PO TB24: 12.5000 mg | ORAL_TABLET | Freq: Every day | ORAL | 5 refills | Status: AC

## 2023-11-07 NOTE — Progress Notes (Unsigned)
 Zio serial # [id number] from office inventory applied to patient. Dr. Court to read.

## 2023-11-07 NOTE — Patient Instructions (Addendum)
 Medication Instructions:  Stop Metoprolol  Tartrate 25 mg Start Metoprolol  Succinate 12.5 mg daily. Please hold if heart rate is 50 bpm or less.  *If you need a refill on your cardiac medications before your next appointment, please call your pharmacy*  Lab Work: NONE ordered at this time of appointment   Testing/Procedures: ZIO XT- Long Term Monitor Instructions  Your physician has requested you wear a ZIO patch monitor for 7 days.  This is a single patch monitor. [employer] supplies one patch monitor per enrollment. Additional stickers are not available. Please do not apply patch if you will be having a Nuclear Stress Test,  Echocardiogram, Cardiac CT, MRI, or Chest Xray during the period you would be wearing the  monitor. The patch cannot be worn during these tests. You cannot remove and re-apply the  ZIO XT patch monitor.  Your ZIO patch monitor will be mailed 3 day USPS to your address on file. It may take 3-5 days  to receive your monitor after you have been enrolled.  Once you have received your monitor, please review the enclosed instructions. Your monitor  has already been registered assigning a specific monitor serial # to you.  Billing and Patient Assistance Program Information  We have supplied [employer] with any of your insurance information on file for billing purposes. [employer] offers a sliding scale Patient Assistance Program for patients that do not have  insurance, or whose insurance does not completely cover the cost of the ZIO monitor.  You must apply for the Patient Assistance Program to qualify for this discounted rate.  To apply, please call [employer] at [phone], select option 4, select option 2, ask to apply for  Patient Assistance Program. Meredeth will ask your household income, and how many people  are in your household. They will quote your out-of-pocket cost based on that information.  [employer] will also be able to set up a 35-month, interest-free payment plan  if needed.  Applying the monitor   Shave hair from upper left chest.  Hold abrader disc by orange tab. Rub abrader in 40 strokes over the upper left chest as  indicated in your monitor instructions.  Clean area with 4 enclosed alcohol pads. Let dry.  Apply patch as indicated in monitor instructions. Patch will be placed under collarbone on left  side of chest with arrow pointing upward.  Rub patch adhesive wings for 2 minutes. Remove white label marked 1. Remove the white  label marked 2. Rub patch adhesive wings for 2 additional minutes.  While looking in a mirror, press and release button in center of patch. A small green light will  flash 3-4 times. This will be your only indicator that the monitor has been turned on.  Do not shower for the first 24 hours. You may shower after the first 24 hours.  Press the button if you feel a symptom. You will hear a small click. Record Date, Time and  Symptom in the Patient Logbook.  When you are ready to remove the patch, follow instructions on the last 2 pages of Patient  Logbook. Stick patch monitor onto the last page of Patient Logbook.  Place Patient Logbook in the blue and white box. Use locking tab on box and tape box closed  securely. The blue and white box has prepaid postage on it. Please place it in the mailbox as  soon as possible. Your physician should have your test results approximately 7 days after the  monitor has been mailed back  to [employer].  Call [facility] Customer Care at [phone] if you have questions regarding  your ZIO XT patch monitor. Call them immediately if you see an orange light blinking on your  monitor.  If your monitor falls off in less than 4 days, contact our Monitor department at [phone].  If your monitor becomes loose or falls off after 4 days call [employer] at [phone] for  suggestions on securing your monitor   Follow-Up: At [facility], you and your health needs are  our priority.  As part of our continuing mission to provide you with exceptional heart care, our providers are all part of one team.  This team includes your primary Cardiologist (physician) and Advanced Practice Providers or APPs (Physician Assistants and Nurse Practitioners) who all work together to provide you with the care you need, when you need it.  Your next appointment:   3-4 month(s) Dr. Court or Damien Braver NP 1 month Dr. Antonetta or Dr. Cindie  Provider:   Dorn Court, MD or Damien Braver, NP          We recommend signing up for the patient portal called MyChart.  Sign up information is provided on this After Visit Summary.  MyChart is used to connect with patients for Virtual Visits (Telemedicine).  Patients are able to view lab/test results, encounter notes, upcoming appointments, etc.  Non-urgent messages can be sent to your provider as well.   To learn more about what you can do with MyChart, go to [email or web address].   Other Instructions

## 2023-11-07 NOTE — Progress Notes (Unsigned)
 Office Visit    Patient Name: [name] Date of Encounter: 11/07/2023  Primary Care Provider:  Clarice Nottingham, MD Primary Cardiologist:  Dorn Lesches, MD  Chief Complaint    83 year old female with a history of aabnormal EKG, elevated coronary calcium  score, PSVT, PACs, PVCs, hypertension, hyperlipidemia, diverticulitis, and GERD who presents for follow-up related to bradycardia and fatigue.  Past Medical History    Past Medical History:  Diagnosis Date   Abnormal EKG    LVH with repolarization changes   Arthritis    Diverticulitis    GERD (gastroesophageal reflux disease)    Headache    migraines    Hyperlipidemia    Hyperlipidemia    Hypertension    Osteoporosis    Visual disorder    Torn retina   Past Surgical History:  Procedure Laterality Date   APPENDECTOMY     COLON SURGERY     DILATATION & CURETTAGE/HYSTEROSCOPY WITH MYOSURE N/A 05/11/2015   Procedure: DILATATION & CURETTAGE/HYSTEROSCOPY WITH MYOSURE POLYPECTOMY;  Surgeon: Dickie Carder, MD;  Location: [facility];  Service: Gynecology;  Laterality: N/A;   EYE SURGERY     SHOULDER ARTHROSCOPY     TONSILLECTOMY      Allergies  Allergies  Allergen Reactions   Abreva [Docosanol] Swelling and Other (See Comments)    Top lip and side of face swollen   Codeine Nausea Only   Fosamax [Alendronate]     Other reaction(s): chest pain     Labs/Other Studies Reviewed    The following studies were reviewed today:  Cardiac Studies & Procedures   ______________________________________________________________________________________________     ECHOCARDIOGRAM  ECHOCARDIOGRAM COMPLETE 02/17/2023  Narrative ECHOCARDIOGRAM REPORT    Patient Name:   [name] Date of Exam: 02/17/2023 Medical Rec #:  [id number]        Height:       63.0 in Accession #:    [id number]       Weight:       154.4 lb Date of Birth:  [birth year]        BSA:          1.732 m Patient Age:    82 years         BP:            130/72 mmHg Patient Gender: F                HR:           50 bpm. Exam Location:  [address]  Procedure: 2D Echo, Cardiac Doppler, Color Doppler, 3D Echo and Strain Analysis  Indications:    R00.2 Palpitations; I47.1 SVT  History:        Patient has prior history of Echocardiogram examinations, most recent 11/07/2017. CAD, Abnormal ECG, Signs/Symptoms:Shortness of Breath; Risk Factors:Hypertension and Dyslipidemia. Palpitations.  Sonographer:    Jon Hacker RCS Referring Phys: [phone] Maron Stanzione C Zissy Hamlett  IMPRESSIONS   1. Left ventricular ejection fraction, by estimation, is 65 to 70%. Left ventricular ejection fraction by 3D volume is 70 %. The left ventricle has normal function. The left ventricle has no regional wall motion abnormalities. There is mild asymmetric left ventricular hypertrophy of the basal-septal segment. Left ventricular diastolic parameters are indeterminate. The average left ventricular global longitudinal strain is -21.6 %. 2. Right ventricular systolic function is normal. The right ventricular size is normal. There is normal pulmonary artery systolic pressure. The estimated right ventricular systolic pressure is 30.9 mmHg. 3. The mitral valve is  normal in structure. Trivial mitral valve regurgitation. No evidence of mitral stenosis. 4. Tricuspid valve regurgitation is mild to moderate. 5. The aortic valve is tricuspid. Aortic valve regurgitation is mild. No aortic stenosis is present. 6. The inferior vena cava is normal in size with greater than 50% respiratory variability, suggesting right atrial pressure of 3 mmHg.  FINDINGS Left Ventricle: Left ventricular ejection fraction, by estimation, is 65 to 70%. Left ventricular ejection fraction by 3D volume is 70 %. The left ventricle has normal function. The left ventricle has no regional wall motion abnormalities. The average left ventricular global longitudinal strain is -21.6 %. The left ventricular internal  cavity size was normal in size. There is mild asymmetric left ventricular hypertrophy of the basal-septal segment. Left ventricular diastolic parameters are indeterminate.  Right Ventricle: The right ventricular size is normal. No increase in right ventricular wall thickness. Right ventricular systolic function is normal. There is normal pulmonary artery systolic pressure. The tricuspid regurgitant velocity is 2.64 m/s, and with an assumed right atrial pressure of 3 mmHg, the estimated right ventricular systolic pressure is 30.9 mmHg.  Left Atrium: Left atrial size was normal in size.  Right Atrium: Right atrial size was normal in size.  Pericardium: There is no evidence of pericardial effusion.  Mitral Valve: The mitral valve is normal in structure. Trivial mitral valve regurgitation. No evidence of mitral valve stenosis.  Tricuspid Valve: The tricuspid valve is normal in structure. Tricuspid valve regurgitation is mild to moderate.  Aortic Valve: The aortic valve is tricuspid. Aortic valve regurgitation is mild. Aortic regurgitation PHT measures 1226 msec. No aortic stenosis is present.  Pulmonic Valve: The pulmonic valve was not well visualized. Pulmonic valve regurgitation is trivial.  Aorta: The aortic root and ascending aorta are structurally normal, with no evidence of dilitation.  Venous: The inferior vena cava is normal in size with greater than 50% respiratory variability, suggesting right atrial pressure of 3 mmHg.  IAS/Shunts: The interatrial septum was not well visualized.   LEFT VENTRICLE PLAX 2D LVIDd:         4.30 cm         Diastology LVIDs:         2.20 cm         LV e' medial:    5.44 cm/s LV PW:         0.80 cm         LV E/e' medial:  14.8 LV IVS:        0.90 cm         LV e' lateral:   9.68 cm/s LVOT diam:     2.00 cm         LV E/e' lateral: 8.3 LV SV:         76 LV SV Index:   44              2D LVOT Area:     3.14 cm        Longitudinal Strain 2D Strain  GLS  -21.6 % Avg:  3D Volume EF LV 3D EF:    Left ventricul ar ejection fraction by 3D volume is 70 %.  3D Volume EF: 3D EF:        70 % LV EDV:       109 ml LV ESV:       33 ml LV SV:        76 ml  RIGHT VENTRICLE RV Basal diam:  3.00 cm RV  S prime:     15.40 cm/s RVSP:           30.9 mmHg  LEFT ATRIUM             Index        RIGHT ATRIUM           Index LA diam:        3.70 cm 2.14 cm/m   RA Pressure: 3.00 mmHg LA Vol (A2C):   43.8 ml 25.28 ml/m  RA Area:     14.70 cm LA Vol (A4C):   40.9 ml 23.61 ml/m  RA Volume:   33.10 ml  19.11 ml/m LA Biplane Vol: 45.2 ml 26.09 ml/m AORTIC VALVE LVOT Vmax:   117.00 cm/s LVOT Vmean:  65.200 cm/s LVOT VTI:    0.241 m AI PHT:      1226 msec  AORTA Ao Root diam: 2.80 cm Ao Asc diam:  3.60 cm  MITRAL VALVE               TRICUSPID VALVE MV Area (PHT): 4.44 cm    TR Peak grad:   27.9 mmHg MV Decel Time: 171 msec    TR Vmax:        264.00 cm/s MV E velocity: 80.70 cm/s  Estimated RAP:  3.00 mmHg MV A velocity: 71.30 cm/s  RVSP:           30.9 mmHg MV E/A ratio:  1.13 SHUNTS Systemic VTI:  0.24 m Systemic Diam: 2.00 cm  Lonni Nanas MD Electronically signed by Lonni Nanas MD Signature Date/Time: 02/17/2023/1:41:50 PM    Final    MONITORS  CARDIAC EVENT MONITOR 12/11/2017  Narrative Sinus rhythm/sinus bradycardia with one short 5 beat run of nonsustained ventricular tachycardia.   CT SCANS  CT CARDIAC SCORING (SELF PAY ONLY) 12/27/2021  Addendum 12/27/2021  1:50 PM ADDENDUM REPORT: 12/27/2021 13:48  EXAM: OVER-READ INTERPRETATION  CT CHEST  The following report is an over-read performed by radiologist Dr. Tanda Lyons of [facility] on 12/27/2021. This over-read does not include interpretation of cardiac or coronary anatomy or pathology. The coronary calcium  score interpretation by the cardiologist is attached.  COMPARISON:  Chest two views  11/25/2020  FINDINGS: Cardiovascular: There are no significant extracardiac vascular findings.  Mediastinum/Nodes: There are no enlarged lymph nodes within the visualized mediastinum.  Lungs/Pleura: There is no pleural effusion. There are two tiny benign calcified granulomas within the superior segment of the right lower lobe (axial series 2, image 13 and axial series 2, image 9). No follow-up imaging recommended.  Upper abdomen: No significant findings in the visualized upper abdomen.  Musculoskeletal/Chest wall: Moderate multilevel disc space narrowing anterior endplate osteophytes within the visualized lower thoracic spine.  IMPRESSION: No significant extracardiac findings within the visualized chest.   Electronically Signed By: Tanda Lyons M.D. On: 12/27/2021 13:48  Narrative CLINICAL DATA:  Cardiovascular Disease Risk stratification  EXAM: Coronary Calcium  Score  MEDICATIONS: MEDICATIONS None  TECHNIQUE: A gated, non-contrast computed tomography scan of the heart was performed using 3mm slice thickness. Axial images were analyzed on a dedicated workstation. Calcium  scoring of the coronary arteries was performed using the Agatston method.  FINDINGS: Coronary arteries: Normal origins.  Coronary Calcium  Score:  Left main: 0  Left anterior descending artery: 101  Left circumflex artery: 0  Right coronary artery: 0  Total: 101  Percentile: 51st  Pericardium: Normal.  Ascending Aorta: Normal caliber.  Non-cardiac: See separate report from [facility].  IMPRESSION:  Coronary calcium  score of 101 Agatston units. This was 51st percentile for age-, race-, and sex-matched controls.  RECOMMENDATIONS: Coronary artery calcium  (CAC) score is a strong predictor of incident coronary heart disease (CHD) and provides predictive information beyond traditional risk factors. CAC scoring is reasonable to use in the decision to withhold, postpone, or  initiate statin therapy in intermediate-risk or selected borderline-risk asymptomatic adults (age 36-75 years and LDL-C >=70 to <190 mg/dL) who do not have diabetes or established atherosclerotic cardiovascular disease (ASCVD).* In intermediate-risk (10-year ASCVD risk >=7.5% to <20%) adults or selected borderline-risk (10-year ASCVD risk >=5% to <7.5%) adults in whom a CAC score is measured for the purpose of making a treatment decision the following recommendations have been made:  If CAC=0, it is reasonable to withhold statin therapy and reassess in 5 to 10 years, as long as higher risk conditions are absent (diabetes mellitus, family history of premature CHD in first degree relatives (males <55 years; females <65 years), cigarette smoking, or LDL >=190 mg/dL).  If CAC is 1 to 99, it is reasonable to initiate statin therapy for patients >=65 years of age.  If CAC is >=100 or >=75th percentile, it is reasonable to initiate statin therapy at any age.  Cardiology referral should be considered for patients with CAC scores >=400 or >=75th percentile.  *2018 AHA/ACC/AACVPR/AAPA/ABC/ACPM/ADA/AGS/APhA/ASPC/NLA/PCNA Guideline on the Management of Blood Cholesterol: A Report of the American College of Cardiology/American Heart Association Task Force on Clinical Practice Guidelines. J Am Coll Cardiol. 2019;73(24):3168-3209.  Ezra Shuck, MD  Electronically Signed: By: Ezra Shuck M.D. On: 12/27/2021 13:12     ______________________________________________________________________________________________     Recent Labs: No results found for requested labs within last 365 days.  Recent Lipid Panel No results found for: CHOL, TRIG, HDL, CHOLHDL, VLDL, LDLCALC, LDLDIRECT  History of Present Illness    83 year old female with the above past medical history including abnormal EKG, elevated coronary calcium  score, PSVT, PACs, PVCs, hypertension, hyperlipidemia,  diverticulitis, and GERD.   She has a history of abnormal EKG (LVH with repolarization abnormality).  Echocardiogram was essentially normal in 2015 in 2019.  Cardiac monitor in the setting of presyncope showed sinus rhythm, sinus bradycardia, occasional PVCs, occasional short atrial runs.  Coronary calcium  score per PCP in 12/2021 revealed coronary calcium  score of 101 (51st percentile). 14-day ZIO monitor in 12/2022 revealed minimum heart rate 42 bpm, max heart rate 179 bpm, average heart rate 60 bpm.  Predominant underlying rhythm was sinus rhythm.  31 runs of SVT occurred, fastest interval lasting 18 beats, max rate 179 bpm, longest interval lasting 15 beats, average heart rate 99 bpm, rare PACs and PVCs.  She was started on metoprolol .  She was last seen in the office on 09/27/2023 and reported significant fatigue.  EKG showed sinus bradycardia.  Daily metoprolol  was discontinued.  She was advised to take metoprolol  tartrate 12.5 mg twice daily as needed for palpitations.  She presents today for follow-up. Since her last visit she has been stable overall from a cardiac standpoint.  She felt better overall for the first 2 weeks, however, a few weeks ago she began noticing increased palpitations.  She has taken metoprolol  tartrate as needed, however, with metoprolol , she has noted significant bradycardia, associated fatigue, HR consistently in the 40s bpm.   Home Medications    Current Outpatient Medications  Medication Sig Dispense Refill   acetaminophen  (TYLENOL ) 500 MG tablet Take 1,000 mg by mouth every 6 (six) hours as needed for headache.  amLODipine  (NORVASC ) 5 MG tablet TAKE 1 TABLET(5 MG) BY MOUTH DAILY 90 tablet 3   aspirin EC 81 MG tablet Take 81 mg by mouth daily. Swallow whole.     atorvastatin  (LIPITOR) 40 MG tablet Take 20 mg by mouth at bedtime.     CALCIUM  CARBONATE-VITAMIN D PO Take 400 mg by mouth 3 (three) times daily.     cholecalciferol (VITAMIN D) 1000 UNITS tablet Take  1,000 Units by mouth daily.     ciclopirox (PENLAC) 8 % solution 1 application Externally Once a day for 90 days     dorzolamide -timolol  (COSOPT ) 22.3-6.8 MG/ML ophthalmic solution Place 1 drop into both eyes 2 (two) times daily.     folic acid  (FOLVITE ) 1 MG tablet Take 3 mg by mouth daily.     losartan-hydrochlorothiazide (HYZAAR) 100-25 MG tablet Take 1 tablet by mouth daily.     methotrexate (RHEUMATREX) 2.5 MG tablet Take 5 mg by mouth.     metoprolol  tartrate (LOPRESSOR ) 25 MG tablet Take 0.5 tablets (12.5 mg total) by mouth 2 (two) times daily as needed. 12.5 mg twice daily as needed for palpitations. Please hold if heart rate is less than 50 bpm. 90 tablet 3   Multiple Vitamins-Minerals (PRESERVISION AREDS PO) Take 1 tablet by mouth 2 (two) times daily.     polyethylene glycol powder (MIRALAX) 17 GM/SCOOP powder Take by mouth. 1 teaspoon daily     Propylene Glycol (SYSTANE BALANCE OP) Place 1 drop into both eyes daily as needed (dry eyes).     Rimegepant Sulfate (NURTEC) 75 MG TBDP Take 75 mg by mouth daily as needed (migraine).     triamcinolone  ointment (KENALOG) 0.1 % 1 application to affected area Externally Twice a day as needed for 14 days     valACYclovir  (VALTREX ) 1000 MG tablet Take 1,000 mg by mouth daily.     Multiple Vitamins-Minerals (ICAPS MV PO) Take 1 tablet by mouth 2 (two) times daily. (Patient not taking: Reported on 11/07/2023)     Multiple Vitamins-Minerals (ICAPS PO) Take 1 capsule by mouth 2 (two) times daily. (Patient not taking: Reported on 11/07/2023)     timolol  (BETIMOL ) 0.25 % ophthalmic solution Apply 1 drop to eye 2 (two) times daily. (Patient not taking: Reported on 11/07/2023)     No current facility-administered medications for this visit.     Review of Systems    She denies chest pain, dyspnea, pnd, orthopnea, n, v, dizziness, syncope, edema, weight gain, or early satiety. All other systems reviewed and are otherwise negative except as noted above.    Physical Exam    VS:  BP 130/82 (BP Location: Left Arm, Patient Position: Sitting, Cuff Size: Normal)   Pulse (!) 49   Ht 5' 3 (1.6 m)   Wt 138 lb (62.6 kg)   SpO2 96%   BMI 24.45 kg/m   GEN: Well nourished, well developed, in no acute distress. HEENT: normal. Neck: Supple, no JVD, carotid bruits, or masses. Cardiac: RRR, no murmurs, rubs, or gallops. No clubbing, cyanosis, edema.  Radials/DP/PT 2+ and equal bilaterally.  Respiratory:  Respirations regular and unlabored, clear to auscultation bilaterally. GI: Soft, nontender, nondistended, BS + x 4. MS: no deformity or atrophy. Skin: warm and dry, no rash. Neuro:  Strength and sensation are intact. Psych: Normal affect.  Accessory Clinical Findings    ECG personally reviewed by me today - EKG Interpretation Date/Time:  Tuesday November 07 2023 08:53:41 EDT Ventricular Rate:  49 PR Interval:  156  QRS Duration:  90 QT Interval:  408 QTC Calculation: 368 R Axis:   34  Text Interpretation: Sinus bradycardia Nonspecific ST abnormality When compared with ECG of 27-Sep-2023 10:24, No significant change was found Confirmed by Daneen Perkins ([id number]) on 11/07/2023 9:27:28 AM  - no acute changes.   Lab Results  Component Value Date   WBC 7.9 11/25/2020   HGB 15.1 (H) 11/25/2020   HCT 45.2 11/25/2020   MCV 102.5 (H) 11/25/2020   PLT 224 11/25/2020   Lab Results  Component Value Date   CREATININE 0.86 11/25/2020   BUN 12 11/25/2020   NA 137 11/25/2020   K 4.0 11/25/2020   CL 103 11/25/2020   CO2 25 11/25/2020   Lab Results  Component Value Date   ALT 32 11/25/2020   AST 25 11/25/2020   ALKPHOS 79 11/25/2020   BILITOT 1.0 11/25/2020   No results found for: CHOL, HDL, LDLCALC, LDLDIRECT, TRIG, CHOLHDL  No results found for: HGBA1C  Assessment & Plan    1. PSVT/palpitations/bradycardia: 14-day ZIO monitor in 12/2022 revealed minimum heart rate 42 bpm, max heart rate 179 bpm, average heart rate 60 bpm.  Predominant underlying rhythm was sinus rhythm.  31 runs of SVT occurred, fastest interval lasting 18 beats, max rate 179 bpm, longest interval lasting 15 beats, average heart rate 99 bpm, rare PACs and PVCs. She was started on metoprolol  with significant improvement in her symptoms.  However, she ultimately reported increased fatigue, persistent bradycardia.  Metoprolol  was changed to as needed use. However, her palpitations quickly returned.  She has taken metoprolol  as needed resulting in significant bradycardia, HR in the 40s bpm.  EKG today shows sinus bradycardia, HR 49 bpm.  She denies presyncope or syncope. Suspect bradycardia is contributing to fatigue. Will check 7-day ZIO. Will stop metoprolol  tartrate and trial metoprolol  succinate 12.5 mg daily, advised her to hold for heart rate less than 50 bpm.  Will refer to EP for ongoing management, evidence of tachycardia-bradycardia syndrome. Reviewed ED precautions.  2. Elevated coronary calcium  score: Coronary calcium  score per PCP in 12/2021 revealed coronary calcium  score of 101 (51st percentile). Stable with no anginal symptoms. No indication for ischemic evaluation.  Continue aspirin,  Lipitor.   3. Hypertension: BP well controlled. Continue current antihypertensive regimen.    4. Hyperlipidemia: LDL was 55 in 11/2022. Consider repeat fasting lipids at follow-up.  Continue Lipitor.    5. Disposition: Refer to EP. Follow-up in 3 to 4 months with Dr. Court.      Perkins JAYSON Daneen, NP 11/07/2023, 9:33 AM

## 2023-11-09 ENCOUNTER — Encounter: Payer: Self-pay | Admitting: Nurse Practitioner

## 2023-11-14 DIAGNOSIS — M1712 Unilateral primary osteoarthritis, left knee: Secondary | ICD-10-CM | POA: Diagnosis not present

## 2023-11-20 DIAGNOSIS — M810 Age-related osteoporosis without current pathological fracture: Secondary | ICD-10-CM | POA: Diagnosis not present

## 2023-11-20 DIAGNOSIS — E7800 Pure hypercholesterolemia, unspecified: Secondary | ICD-10-CM | POA: Diagnosis not present

## 2023-11-20 DIAGNOSIS — I10 Essential (primary) hypertension: Secondary | ICD-10-CM | POA: Diagnosis not present

## 2023-11-21 DIAGNOSIS — R001 Bradycardia, unspecified: Secondary | ICD-10-CM | POA: Diagnosis not present

## 2023-11-21 DIAGNOSIS — I471 Supraventricular tachycardia, unspecified: Secondary | ICD-10-CM | POA: Diagnosis not present

## 2023-11-26 DIAGNOSIS — R001 Bradycardia, unspecified: Secondary | ICD-10-CM

## 2023-11-26 DIAGNOSIS — I471 Supraventricular tachycardia, unspecified: Secondary | ICD-10-CM

## 2023-11-27 DIAGNOSIS — D84821 Immunodeficiency due to drugs: Secondary | ICD-10-CM | POA: Diagnosis not present

## 2023-11-27 DIAGNOSIS — H209 Unspecified iridocyclitis: Secondary | ICD-10-CM | POA: Diagnosis not present

## 2023-11-27 DIAGNOSIS — R051 Acute cough: Secondary | ICD-10-CM | POA: Diagnosis not present

## 2023-11-27 DIAGNOSIS — I2510 Atherosclerotic heart disease of native coronary artery without angina pectoris: Secondary | ICD-10-CM | POA: Diagnosis not present

## 2023-11-27 DIAGNOSIS — K219 Gastro-esophageal reflux disease without esophagitis: Secondary | ICD-10-CM | POA: Diagnosis not present

## 2023-11-27 DIAGNOSIS — J309 Allergic rhinitis, unspecified: Secondary | ICD-10-CM | POA: Diagnosis not present

## 2023-11-27 DIAGNOSIS — Z0000 Encounter for general adult medical examination without abnormal findings: Secondary | ICD-10-CM | POA: Diagnosis not present

## 2023-11-27 DIAGNOSIS — I10 Essential (primary) hypertension: Secondary | ICD-10-CM | POA: Diagnosis not present

## 2023-11-27 DIAGNOSIS — K5130 Ulcerative (chronic) rectosigmoiditis without complications: Secondary | ICD-10-CM | POA: Diagnosis not present

## 2023-11-27 DIAGNOSIS — I495 Sick sinus syndrome: Secondary | ICD-10-CM | POA: Diagnosis not present

## 2023-11-27 DIAGNOSIS — M810 Age-related osteoporosis without current pathological fracture: Secondary | ICD-10-CM | POA: Diagnosis not present

## 2023-11-27 NOTE — Progress Notes (Deleted)
  Electrophysiology Office Note:   Date:  11/27/2023  ID:  MATTYE VERDONE, DOB [birth year], MRN [id number]  Primary Cardiologist: Dorn Lesches, MD Primary Heart Failure: None Electrophysiologist: None  {Click to update primary MD,subspecialty MD or APP then REFRESH:1}    History of Present Illness:   [name] is a 83 y.o. female with h/o elevated coronary calcium , SVT, PACs, PVCs, hypertension, hyperlipidemia, diverticulitis seen today for  for Electrophysiology evaluation of bradycardia at the request of Damien Braver.    She wore a cardiac monitor 12/2022 which showed an average heart rate of 60 bpm.  She had some SVT longest lasting 18 beats.  She was started on metoprolol .  She was seen in the office 09/27/2023 and reported fatigue.  EKG showed sinus bradycardia and metoprolol  was reduced.  ***  Review of systems complete and found to be negative unless listed in HPI.   EP Information / Studies Reviewed:    {EKGtoday:28818}      Risk Assessment/Calculations:            Physical Exam:   VS:  There were no vitals taken for this visit.   Wt Readings from Last 3 Encounters:  11/07/23 138 lb (62.6 kg)  09/27/23 137 lb (62.1 kg)  03/21/23 143 lb (64.9 kg)     GEN: Well nourished, well developed in no acute distress NECK: No JVD; No carotid bruits CARDIAC: {EPRHYTHM:28826}, no murmurs, rubs, gallops RESPIRATORY:  Clear to auscultation without rales, wheezing or rhonchi  ABDOMEN: Soft, non-tender, non-distended EXTREMITIES:  No edema; No deformity   ASSESSMENT AND PLAN:    1.  SVT: Recent 70 monitor, personally reviewed, with an average heart rate of 54 bpm.  She did have some episodes of SVT, but all were less than 20 beats..  She was started on metoprolol  and has had significant bradycardia.  Metoprolol  switched to as needed use.***  2.  Elevated coronary calcium  score: Continue aspirin and Lipitor per primary cardiology  3.  Hypertension:***  4.   Hyperlipidemia: Continue Lipitor  Follow up with {EPMDS:28135::EP Team} {EPFOLLOW LE:71826}  Signed, Juliany Daughety [name], MD

## 2023-11-28 ENCOUNTER — Ambulatory Visit: Payer: Self-pay | Admitting: Nurse Practitioner

## 2023-11-28 ENCOUNTER — Ambulatory Visit: Admitting: Cardiology

## 2023-12-20 DIAGNOSIS — M810 Age-related osteoporosis without current pathological fracture: Secondary | ICD-10-CM | POA: Diagnosis not present

## 2023-12-21 ENCOUNTER — Encounter: Payer: Self-pay | Admitting: Cardiology

## 2023-12-21 ENCOUNTER — Ambulatory Visit: Attending: Cardiology | Admitting: Cardiology

## 2023-12-21 VITALS — BP 108/60 | HR 54 | Ht 63.0 in | Wt 137.7 lb

## 2023-12-21 DIAGNOSIS — Z01812 Encounter for preprocedural laboratory examination: Secondary | ICD-10-CM | POA: Insufficient documentation

## 2023-12-21 DIAGNOSIS — I495 Sick sinus syndrome: Secondary | ICD-10-CM | POA: Insufficient documentation

## 2023-12-21 DIAGNOSIS — I10 Essential (primary) hypertension: Secondary | ICD-10-CM | POA: Diagnosis not present

## 2023-12-21 NOTE — Patient Instructions (Signed)
 Medication Instructions:  Your physician recommends that you continue on your current medications as directed. Please refer to the Current Medication list given to you today.  *If you need a refill on your cardiac medications before your next appointment, please call your pharmacy*  Lab Work: None ordered  If you have any lab test that is abnormal or we need to change your treatment, we will call you to review the results.  Testing/Procedures: Your physician has recommended that you have a pacemaker inserted. A pacemaker is a small device that is placed under the skin of your chest or abdomen to help control abnormal heart rhythms. This device uses electrical pulses to prompt the heart to beat at a normal rate. Pacemakers are used to treat heart rhythms that are too slow. Wire (leads) are attached to the pacemaker that goes into the chambers of you heart. This is done in the hospital and usually requires and overnight stay.  You will be scheduled for 03/19/2024, we will be in touch and will send you instructions via mychart.   Follow-Up: At [facility], you and your health needs are our priority.  As part of our continuing mission to provide you with exceptional heart care, our providers are all part of one team.  This team includes your primary Cardiologist (physician) and Advanced Practice Providers or APPs (Physician Assistants and Nurse Practitioners) who all work together to provide you with the care you need, when you need it.  Your next appointment:   2 week(s) after your pacemaker implant  Provider:   Device clinic for a wound check     Thank you for choosing [facility]!!   Maeola Domino, RN [phone]   Other Instructions  Pacemaker Implantation, Adult Pacemaker implantation is a procedure to put a pacemaker in the chest. A pacemaker is a small computer that sends electrical signals to the heart. This helps the heart beat normally. Some pacemakers keep  information about heart rhythms. You may need this procedure if you have: A slow heartbeat (bradycardia). Repeated fainting (syncope), or you often have dizziness or light-headedness because of an irregular heart rate. A weak heart that is not pumping enough blood to your body. The pacemaker can help your heart chambers beat in sync. The pacemaker attaches to your heart with a wire called a lead. One or two leads may be needed. There are different types of pacemakers: Transvenous pacemaker. This type is placed under the skin or muscle of your upper chest. The lead goes through a vein in the chest to the inside of the heart. Epicardial pacemaker. This type is placed under the skin or muscle of your chest or belly. The lead goes through your chest to the outside of the heart. Tell a health care provider about: Any allergies you have. All medicines you are taking, including vitamins, herbs, eye drops, creams, and over-the-counter medicines. Any problems you or family members have had with anesthesia. Any bleeding problems or bone disorders you have. Any surgeries you have had. Any medical conditions you have. Whether you are pregnant or may be pregnant. What are the risks? Your health care provider will talk with you about risks. These may include: Infection. Bleeding. Failure of the pacemaker or lead. A collapsed lung or bleeding into a lung. A blood clot inside a blood vessel with a lead. Heart damage. Infection inside the heart (endocarditis). Allergic reactions to medicines. What happens before the procedure? When to stop eating and drinking Follow instructions from  your provider about what you may eat and drink. These may include: 8 hours before your procedure Stop eating most foods. Do not eat meat, fried foods, or fatty foods. Eat only light foods, such as toast or crackers. All liquids are okay except energy drinks and alcohol. 6 hours before your procedure Stop eating. Drink  only clear liquids, such as water, clear fruit juice, black coffee, plain tea, and sports drinks. Do not drink energy drinks or alcohol. 2 hours before your procedure Stop drinking all liquids. You may be allowed to take medicines with small sips of water. If you do not follow your provider's instructions, your procedure may be delayed or canceled. Medicines Ask your provider about: Changing or stopping your regular medicines. These include any diabetes medicines or blood thinners you take. Taking medicines such as aspirin and ibuprofen . These medicines can thin your blood. Do not take them unless your provider tells you to. Taking over-the-counter medicines, vitamins, herbs, and supplements. Tests You may have: A heart test. This may include: An electrocardiogram (ECG). Patches will be put on your skin to check your heart rhythm. A chest X-ray. An echocardiogram. Sound waves (ultrasound) are used to get an image of the heart. A cardiac rhythm monitor. This records your heart rhythm and any events for a longer period of time. Blood tests. Genetic testing. General instructions Do not use any products that contain nicotine or tobacco. These products include cigarettes, chewing tobacco, and vaping devices, such as e-cigarettes. If you need help quitting, ask your provider. Ask your provider: How your surgery site will be marked. What steps will be taken to help prevent infection. These steps may include: Removing hair at the surgery site. Washing skin with a soap that kills germs. Receiving antibiotics. If you will be going home right after the procedure, plan to have a responsible adult: Take you home from the hospital or clinic. You will not be allowed to drive. Care for you for the time you are told. What happens during the procedure? An IV will be inserted into one of your veins. You may be given: A sedative. This helps you relax. Anesthesia. This keeps you from feeling pain. It  will make you fall asleep for surgery. The next steps vary depending on which pacemaker you will be getting. If you are getting a transvenous pacemaker: An incision will be made in your upper chest. A pocket will be made for the pacemaker. It may be placed under the skin or between layers of muscle. The lead will be put into a blood vessel that goes to the heart. While X-rays are taken by an imaging machine (fluoroscopy), the lead will be moved through the vein to the inside of your heart. The other end of the lead will be put under the skin and attached to the pacemaker. If you are getting an epicardial pacemaker: An incision will be made near your ribs or breastbone (sternum) for the lead. The lead will be attached to the outside of your heart. Another incision will be made in your chest or upper belly to make a pocket for the pacemaker. The free end of the lead will be moved under the skin and attached to the pacemaker. The pacemaker will be tested. Pictures may be taken to check the lead position. The incisions will be closed with stitches (sutures), tape strips, or skin glue. Bandages (dressings) will be placed over the incisions. The procedure may vary among providers and hospitals. What happens after the procedure?  Your blood pressure, heart rate, breathing rate, and blood oxygen level will be monitored until you leave the hospital or clinic. You may be given antibiotics. You will be given pain medicine. An ECG and chest X-rays will be done. Your provider will program the pacemaker. If you were given a sedative during the procedure, it can affect you for several hours. Do not drive or operate machinery until your provider says that it is safe. You will be given a pacemaker identification card. This card lists the implant date, device model, and manufacturer of your pacemaker. This information is not intended to replace advice given to you by your health care provider. Make sure you  discuss any questions you have with your health care provider. Document Revised: 01/07/2022 Document Reviewed: 01/07/2022 Elsevier Patient Education  2024 [employer].   After Your Pacemaker   You have a ____________ Pacemaker  If you have a Medtronic or Biotronik device, plug in your home monitor once you get home, and no manual interaction is required.   If you have an [employer] or [employer] device, plug your home monitor once you get home, sit near the device, and press the large activation button. Sit nearby until the process is complete, usually notated by lights on the monitor.   If you were set up for monitoring using an app on your phone, make sure the app remains open in the background and the Bluetooth remains on.  ACTIVITY Do not lift your arm above shoulder height for 1 week after your procedure. After 7 days, you may progress as below.  You should remove your sling 24 hours after your procedure, unless otherwise instructed by your provider.     @RELDATE (T+7,60)@  @RELDATE (T+8,60)@ @RELDATE (T+9,60)@ @RELDATE (T+10,60)@   Do not lift, push, pull, or carry anything over 10 pounds with the affected arm until 6 weeks (@RELDATE (w+6,60)@ ) after your procedure.   You may drive AFTER your wound check, unless you have been told otherwise by your provider.   Ask your healthcare provider when you can go back to work   INCISION/Dressing If you are on a blood thinner such as Coumadin, Xarelto, Eliquis, Plavix, or Pradaxa please confirm with your provider when this should be resumed. ______  If large square, outer bandage is left in place, this can be removed after 24 hours from your procedure. Do not remove steri-strips or glue as below.   If a PRESSURE DRESSING (a bulky dressing that usually goes up over your shoulder) was applied or left in place, please follow instructions given by your provider on when to return to have this removed.   Monitor your Pacemaker site for  redness, swelling, and drainage. Call the device clinic at [phone] if you experience these symptoms or fever/chills.  If your incision is sealed with Steri-strips or staples, you may shower 7 days after your procedure or when told by your provider. Do not remove the steri-strips or let the shower hit directly on your site. You may wash around your site with soap and water.    If you were discharged in a sling, please do not wear this during the day more than 48 hours after your surgery unless otherwise instructed. This may increase the risk of stiffness and soreness in your shoulder.   Avoid lotions, ointments, or perfumes over your incision until it is well-healed.  You may use a hot tub or a pool AFTER your wound check appointment if the incision is completely closed.  Pacemaker Alerts:  Some alerts are vibratory and others beep. These are NOT emergencies. Please call our office to let us  know. If this occurs at night or on weekends, it can wait until the next business day. Send a remote transmission.  If your device is capable of reading fluid status (for heart failure), you will be offered monthly monitoring to review this with you.   DEVICE MANAGEMENT Remote monitoring is used to monitor your pacemaker from home. This monitoring is scheduled every 91 days by our office. It allows us  to keep an eye on the functioning of your device to ensure it is working properly. You will routinely see your Electrophysiologist annually (more often if necessary).  This will appear as a REMOTE check on your MyChart schedule. These are automatic and there is nothing for you to manually do unless otherwise instructed.  You should receive your ID card for your new device in 4-8 weeks. Keep this card with you at all times once received. Consider wearing a medical alert bracelet or necklace.  Your Pacemaker may be MRI compatible. This will be discussed at your next office visit/wound check.  You should avoid  contact with strong electric or magnetic fields.   Do not use amateur (ham) radio equipment or electric (arc) welding torches. MP3 player headphones with magnets should not be used. Some devices are safe to use if held at least 12 inches (30 cm) from your Pacemaker. These include power tools, lawn mowers, and speakers. If you are unsure if something is safe to use, ask your health care provider.  When using your cell phone, hold it to the ear that is on the opposite side from the Pacemaker. Do not leave your cell phone in a pocket over the Pacemaker.  You may safely use electric blankets, heating pads, computers, and microwave ovens.  Call the office right away if: You have chest pain. You feel more short of breath than you have felt before. You feel more light-headed than you have felt before. Your incision starts to open up.  This information is not intended to replace advice given to you by your health care provider. Make sure you discuss any questions you have with your health care provider.

## 2023-12-21 NOTE — Progress Notes (Signed)
  Electrophysiology Office Note:   Date:  12/21/2023  ID:  [name], DOB 1941/02/28, MRN [id number]  Primary Cardiologist: Dorn Lesches, MD Primary Heart Failure: None Electrophysiologist: None      History of Present Illness:   [name] is a 83 y.o. female with h/o elevated coronary calcium  score, SVT, PACs, PVCs, hypertension, hyperlipidemia seen today for  for Electrophysiology evaluation of bradycardia at the request of Damien Braver.    She wore a cardiac monitor that showed episodes of sinus rhythm and sinus bradycardia.  She had short atrial runs.  Heart monitor was placed in the setting of presyncope.  Metoprolol  has been stopped.  She does use as needed metoprolol  for palpitations.  Recently, she has noted significant bradycardia.  Heart rates have been in the 40s.  When her heart rate is slow in the 40s, she feels significant fatigue, weakness, shortness of breath.  She feels fatigued most of the time as well.  she does note palpitations, but does not take her as needed metoprolol  when her heart rates are in the low 50s.  Review of systems complete and found to be negative unless listed in HPI.   EP Information / Studies Reviewed:    EKG is not ordered today. EKG from 11/07/2023 reviewed which showed sinus rhythm, rate 49        Risk Assessment/Calculations:            Physical Exam:   VS:  BP 108/60 (BP Location: Right Arm, Patient Position: Sitting, Cuff Size: Normal)   Pulse (!) 54   Ht 5' 3 (1.6 m)   Wt 137 lb 11.2 oz (62.5 kg)   SpO2 97%   BMI 24.39 kg/m    Wt Readings from Last 3 Encounters:  12/21/23 137 lb 11.2 oz (62.5 kg)  11/07/23 138 lb (62.6 kg)  09/27/23 137 lb (62.1 kg)     GEN: Well nourished, well developed in no acute distress NECK: No JVD; No carotid bruits CARDIAC: Regular rate and rhythm, no murmurs, rubs, gallops RESPIRATORY:  Clear to auscultation without rales, wheezing or rhonchi  ABDOMEN: Soft, non-tender,  non-distended EXTREMITIES:  No edema; No deformity   ASSESSMENT AND PLAN:    1.  Sick sinus syndrome: Has heart rates in the 40s with symptoms of fatigue and shortness of breath.  Due to this, she would likely benefit from pacemaker implant.  Risks and benefits have been discussed.  She understands the risks and has agreed to the procedure.  Explained risks, benefits, and alternatives to PPM implantation, including but not limited to bleeding, infection, pneumothorax, pericardial effusion, lead dislodgement, heart attack, stroke, or death.  Pt verbalized understanding and agrees to proceed.  2.  Elevated coronary calcium  score: No current chest pain.  Plan per primary cardiology.  3.  Hypertension: Well-controlled  Follow up with EP Team as usual post procedure  Signed, Jo Booze [name], MD

## 2023-12-26 DIAGNOSIS — H35372 Puckering of macula, left eye: Secondary | ICD-10-CM | POA: Diagnosis not present

## 2023-12-26 DIAGNOSIS — H4052X2 Glaucoma secondary to other eye disorders, left eye, moderate stage: Secondary | ICD-10-CM | POA: Diagnosis not present

## 2023-12-26 DIAGNOSIS — H209 Unspecified iridocyclitis: Secondary | ICD-10-CM | POA: Diagnosis not present

## 2023-12-26 DIAGNOSIS — Z79899 Other long term (current) drug therapy: Secondary | ICD-10-CM | POA: Diagnosis not present

## 2023-12-26 DIAGNOSIS — Z961 Presence of intraocular lens: Secondary | ICD-10-CM | POA: Diagnosis not present

## 2023-12-29 DIAGNOSIS — Z23 Encounter for immunization: Secondary | ICD-10-CM | POA: Diagnosis not present

## 2024-01-12 DIAGNOSIS — H903 Sensorineural hearing loss, bilateral: Secondary | ICD-10-CM | POA: Diagnosis not present

## 2024-01-29 ENCOUNTER — Ambulatory Visit: Attending: Cardiovascular Disease | Admitting: Cardiovascular Disease

## 2024-01-29 ENCOUNTER — Encounter: Payer: Self-pay | Admitting: Cardiovascular Disease

## 2024-01-29 VITALS — BP 120/70 | HR 55 | Ht 63.0 in | Wt 136.0 lb

## 2024-01-29 DIAGNOSIS — E782 Mixed hyperlipidemia: Secondary | ICD-10-CM | POA: Insufficient documentation

## 2024-01-29 DIAGNOSIS — R931 Abnormal findings on diagnostic imaging of heart and coronary circulation: Secondary | ICD-10-CM | POA: Insufficient documentation

## 2024-01-29 DIAGNOSIS — R002 Palpitations: Secondary | ICD-10-CM | POA: Diagnosis not present

## 2024-01-29 DIAGNOSIS — I10 Essential (primary) hypertension: Secondary | ICD-10-CM | POA: Insufficient documentation

## 2024-01-29 NOTE — Assessment & Plan Note (Signed)
 History of hyperlipidemia on statin therapy with lipid profile performed 11/20/2023 revealing total cholesterol 136, LDL 54 and HDL of 67.

## 2024-01-29 NOTE — Patient Instructions (Signed)
 Medication Instructions:  Your physician recommends that you continue on your current medications as directed. Please refer to the Current Medication list given to you today.  *If you need a refill on your cardiac medications before your next appointment, please call your pharmacy*   Follow-Up: At [facility], you and your health needs are our priority.  As part of our continuing mission to provide you with exceptional heart care, our providers are all part of one team.  This team includes your primary Cardiologist (physician) and Advanced Practice Providers or APPs (Physician Assistants and Nurse Practitioners) who all work together to provide you with the care you need, when you need it.  Your next appointment:   6 month(s)  Provider:   Damien Braver, NP         Then, Dorn Lesches, MD will plan to see you again in 12 month(s).

## 2024-01-29 NOTE — Assessment & Plan Note (Signed)
 History of essential hypertension with blood pressure measured today at 120/70.  She is on amlodipine , losartan and hydrochlorothiazide.  She also takes 12.5 mg of Toprol  XL daily.

## 2024-01-29 NOTE — Assessment & Plan Note (Signed)
 Elevated coronary calcium  score of 101 performed 12/27/2021 all in the LAD territory.  She is completely asymptomatic and at goal for secondary prevention.

## 2024-01-29 NOTE — Assessment & Plan Note (Signed)
 History of palpitations with event monitoring showing occasional PACs, more frequent PVCs and short runs of SVT.  She is on low-dose beta-blocker which improves the symptoms.  She also has symptoms of tachybradycardia syndrome with heart rates in the 40s which she is symptomatic from.  She did see Dr. Inocencio who was arranged for her to undergo permanent transvenous pacemaker insertion 03/19/2024.

## 2024-01-29 NOTE — Progress Notes (Signed)
 01/29/2024 [name]   [birth year]  [id number]  Primary Physician Clarice Nottingham, MD Primary Cardiologist: Dorn JINNY Lesches MD GENI CODY MADEIRA, FSCAI  HPI:  [name] is a 83 y.o.    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 She was referred by Dr. Dr. Nottingham Clarice at [facility]  medical initially for evaluation of generalized fatigue and an abnormal EKG. I last saw her in the office 03/21/2023.  Her cardiovascular risk factor profile is remarkable for hypertension, hyperlipidemia and family history. Her father had heart-related issues in the 43s and a brother did have a stent before he died of lung cancer in his 32s. She has never had a heart attack or stroke. She does complain of dyspnea but denies chest pain. She was referred for an abnormal EKG with ST segment changes.   She had a subsequent 2D echocardiogram after her office visit performed 08/14/2013 that was essentially normal as was an echo 11/07/2017.  Since I saw her 4 years ago she is been referred back for 6 episodes of presyncope which began several months ago.  She does feel palpitations during these and get some shortness of breath as well as weakness after they subside.  Holter monitor showed sinus rhythm consult sinus sinus bradycardia with occasional PVCs and occasional short atrial runs which do not appear to be contributory.     Since I saw her in the office a year ago she is remained stable.    She denies chest pain or shortness of breath.  She did have a coronary calcium  score performed 12/27/2021 which was 101 all of which was in the LAD territory.  She is completely asymptomatic.  Her most recent lipid profile performed by her PCP 11/20/2023 revealed total cholesterol 136, LDL 54 and HDL of 67.  She did see Damien Boos, FNP who referred her to Dr. Inocencio because of tacky  bradycardia syndrome and feelings of fatigue when her heart rates were in the 40s.  She is scheduled for permanent transvenous pacemaker insertion 03/19/2024.  No outpatient medications have been marked as taking for the 01/29/24 encounter (Office Visit) with Lesches Dorn JINNY, MD.     Allergies  Allergen Reactions   Abreva [Docosanol] Swelling and Other (See Comments)    Top lip and side of face swollen   Codeine Nausea Only   Fosamax [Alendronate]     Other reaction(s): chest pain   Neomycin-Polymyxin-Dexameth     Other Reaction(s): tingling lips    Social History   Socioeconomic History   Marital status: Widowed    Spouse name: Not on file   Number of children: Not on file   Years of education: Not on file   Highest education level: Not on file  Occupational History   Not on file  Tobacco Use   Smoking status: Never   Smokeless tobacco: Never  Vaping Use   Vaping status: Never Used  Substance and Sexual Activity   Alcohol use: No   Drug use: No   Sexual activity: Not on file  Other Topics Concern   Not on file  Social History Narrative   Not on file   Social Drivers of Health   Financial Resource Strain: Not on file  Food Insecurity: Not on file  Transportation Needs: Not on file  Physical Activity: Not on  file  Stress: Not on file  Social Connections: Not on file  Intimate Partner Violence: Not on file     Review of Systems: General: negative for chills, fever, night sweats or weight changes.  Cardiovascular: negative for chest pain, dyspnea on exertion, edema, orthopnea, palpitations, paroxysmal nocturnal dyspnea or shortness of breath Dermatological: negative for rash Respiratory: negative for cough or wheezing Urologic: negative for hematuria Abdominal: negative for nausea, vomiting, diarrhea, bright red blood per rectum, melena, or hematemesis Neurologic: negative for visual changes, syncope, or dizziness All other systems reviewed and are otherwise  negative except as noted above.    Blood pressure 120/70, pulse (!) 55, height 5' 3 (1.6 m), weight 136 lb (61.7 kg), SpO2 97%.  General appearance: alert and no distress Neck: no adenopathy, no carotid bruit, no JVD, supple, symmetrical, trachea midline, and thyroid  not enlarged, symmetric, no tenderness/mass/nodules Lungs: clear to auscultation bilaterally Heart: regular rate and rhythm, S1, S2 normal, no murmur, click, rub or gallop Extremities: extremities normal, atraumatic, no cyanosis or edema Pulses: 2+ and symmetric Skin: Skin color, texture, turgor normal. No rashes or lesions Neurologic: Grossly normal  EKG      ASSESSMENT AND PLAN:   Essential hypertension History of essential hypertension with blood pressure measured today at 120/70.  She is on amlodipine , losartan and hydrochlorothiazide.  She also takes 12.5 mg of Toprol  XL daily.  Hyperlipidemia History of hyperlipidemia on statin therapy with lipid profile performed 11/20/2023 revealing total cholesterol 136, LDL 54 and HDL of 67.  Elevated coronary artery calcium  score Elevated coronary calcium  score of 101 performed 12/27/2021 all in the LAD territory.  She is completely asymptomatic and at goal for secondary prevention.  Palpitations History of palpitations with event monitoring showing occasional PACs, more frequent PVCs and short runs of SVT.  She is on low-dose beta-blocker which improves the symptoms.  She also has symptoms of tachybradycardia syndrome with heart rates in the 40s which she is symptomatic from.  She did see Dr. Inocencio who was arranged for her to undergo permanent transvenous pacemaker insertion 03/19/2024.     Dorn DOROTHA Lesches MD FACP,FACC,FAHA, [facility] 01/29/2024 9:20 AM

## 2024-01-31 DIAGNOSIS — H6123 Impacted cerumen, bilateral: Secondary | ICD-10-CM | POA: Diagnosis not present

## 2024-02-12 DIAGNOSIS — H4052X2 Glaucoma secondary to other eye disorders, left eye, moderate stage: Secondary | ICD-10-CM | POA: Diagnosis not present

## 2024-02-14 ENCOUNTER — Other Ambulatory Visit: Payer: Self-pay

## 2024-02-14 ENCOUNTER — Telehealth: Payer: Self-pay

## 2024-02-14 DIAGNOSIS — I495 Sick sinus syndrome: Secondary | ICD-10-CM

## 2024-02-14 DIAGNOSIS — Z01812 Encounter for preprocedural laboratory examination: Secondary | ICD-10-CM

## 2024-02-14 NOTE — Telephone Encounter (Signed)
-----   Message from Nurse Sherri P sent at 01/15/2024  5:56 PM EST ----- Regarding: 03/19/2024  PPM implant Precert:  MD: Inocencio Type of implant: PPM Device manufacturer: Medtronic Diagnosis: bradycardia CPT code: PPM implant, dual - 66791 C-code(s), including quantity (if indicated): 1785, 1898 x 2 Procedure scheduled (date/time): 03/19/24  1:30 pm  Procedure:  Scrub given? Yes  Medication instructions: routine Message sent to CVRR? N/a Added to calendar? Yes Orders entered? Yes Letter complete? No, >30 days before procedure Scheduled with cath lab? Yes Labs ordered (CBC, BMET, PT/INR if on warfarin)? Yes Dye allergy? N/a Pre-meds ordered and instructions given? N/a Letter method: MyChart Special instructions:  H&P: 10/9  Follow-up:  Cassie/Angel, please schedule Routine.

## 2024-02-27 ENCOUNTER — Encounter (HOSPITAL_COMMUNITY): Payer: Self-pay

## 2024-02-27 ENCOUNTER — Telehealth (HOSPITAL_COMMUNITY): Payer: Self-pay

## 2024-02-27 NOTE — Telephone Encounter (Signed)
 Spoke with patient to complete pre-procedure call.     Health status review:  Any new medical conditions, recent signs of acute illness or been started on antibiotics? No Any recent hospitalizations or surgeries? No Any new medications started since pre-op visit? No  Follow all medication instructions prior to procedure or the procedure may be rescheduled:    HOLD ASPIRIN the morning of your procedure. NO eating or drinking after midnight (NPO after 12:00am) except for sips of water with your medications. The night before your procedure and the morning of your procedure, wash thoroughly with the CHG surgical soap from the neck down, paying special attention to the area where your procedure will be performed.  Pre-procedure testing scheduled: lab work by December 23.  Confirmed patient is scheduled for  Permanent transvenous pacemaker (PPM) on Tuesday, January 6 with Dr. Inocencio. Instructed patient to arrive at the Main Entrance A at [facility]: [address] [place] [address] [place], [place] [address] and check in at Admitting at 11:30 AM.  Plan to go home the same day, you will only stay overnight if medically necessary. You MUST have a responsible adult to drive you home and MUST be with you the first 24 hours after you arrive home or your procedure could be cancelled.  Informed a nurse may call a day before the procedure to confirm arrival time and ensure instructions are followed.  Patient verbalized understanding to information provided and is agreeable to proceed with procedure.   Advised to contact RN Navigator at [phone], to inform of any new medications started after call or concerns prior to procedure.

## 2024-02-28 LAB — CBC
Hematocrit: 44.1 % (ref 34.0–46.6)
Hemoglobin: 14.9 g/dL (ref 11.1–15.9)
MCH: 33.6 pg — ABNORMAL HIGH (ref 26.6–33.0)
MCHC: 33.8 g/dL (ref 31.5–35.7)
MCV: 100 fL — ABNORMAL HIGH (ref 79–97)
Platelets: 239 x10E3/uL (ref 150–450)
RBC: 4.43 x10E6/uL (ref 3.77–5.28)
RDW: 12.6 % (ref 11.7–15.4)
WBC: 7.9 x10E3/uL (ref 3.4–10.8)

## 2024-02-28 LAB — BASIC METABOLIC PANEL WITH GFR
BUN/Creatinine Ratio: 13 (ref 12–28)
BUN: 10 mg/dL (ref 8–27)
CO2: 25 mmol/L (ref 20–29)
Calcium: 10.4 mg/dL — ABNORMAL HIGH (ref 8.7–10.3)
Chloride: 95 mmol/L — ABNORMAL LOW (ref 96–106)
Creatinine, Ser: 0.77 mg/dL (ref 0.57–1.00)
Glucose: 93 mg/dL (ref 70–99)
Potassium: 3.8 mmol/L (ref 3.5–5.2)
Sodium: 136 mmol/L (ref 134–144)
eGFR: 77 mL/min/1.73 (ref >59)

## 2024-03-13 ENCOUNTER — Encounter: Payer: Self-pay | Admitting: Emergency Medicine

## 2024-03-18 NOTE — Pre-Procedure Instructions (Addendum)
Instructed patient on the following items: Arrival time 1100 Nothing to eat or drink after midnight No meds AM of procedure Responsible person to drive you home and stay with you for 24 hrs Wash with special soap night before and morning of procedure  

## 2024-03-19 ENCOUNTER — Ambulatory Visit (HOSPITAL_COMMUNITY)

## 2024-03-19 ENCOUNTER — Other Ambulatory Visit: Payer: Self-pay

## 2024-03-19 ENCOUNTER — Encounter (HOSPITAL_COMMUNITY): Payer: Self-pay | Admitting: Cardiology

## 2024-03-19 ENCOUNTER — Encounter (HOSPITAL_COMMUNITY): Admission: RE | Disposition: A | Payer: Self-pay | Source: Home / Self Care | Attending: Cardiology

## 2024-03-19 ENCOUNTER — Ambulatory Visit (HOSPITAL_COMMUNITY)
Admission: RE | Admit: 2024-03-19 | Discharge: 2024-03-19 | Disposition: A | Discharge status: Home / Self Care | Attending: Cardiology | Admitting: Cardiology

## 2024-03-19 DIAGNOSIS — R931 Abnormal findings on diagnostic imaging of heart and coronary circulation: Secondary | ICD-10-CM

## 2024-03-19 DIAGNOSIS — I471 Supraventricular tachycardia, unspecified: Secondary | ICD-10-CM | POA: Diagnosis not present

## 2024-03-19 DIAGNOSIS — E785 Hyperlipidemia, unspecified: Secondary | ICD-10-CM | POA: Diagnosis not present

## 2024-03-19 DIAGNOSIS — R9431 Abnormal electrocardiogram [ECG] [EKG]: Secondary | ICD-10-CM

## 2024-03-19 DIAGNOSIS — I495 Sick sinus syndrome: Secondary | ICD-10-CM | POA: Diagnosis not present

## 2024-03-19 DIAGNOSIS — I10 Essential (primary) hypertension: Secondary | ICD-10-CM | POA: Insufficient documentation

## 2024-03-19 DIAGNOSIS — I429 Cardiomyopathy, unspecified: Secondary | ICD-10-CM | POA: Insufficient documentation

## 2024-03-19 DIAGNOSIS — Z79899 Other long term (current) drug therapy: Secondary | ICD-10-CM | POA: Diagnosis not present

## 2024-03-19 HISTORY — PX: PACEMAKER IMPLANT: EP1218

## 2024-03-19 MED ORDER — ACETAMINOPHEN 325 MG PO TABS: 325.0000 mg | ORAL_TABLET | ORAL | Status: DC | PRN

## 2024-03-19 MED ORDER — FENTANYL CITRATE (PF) 100 MCG/2ML IJ SOLN
INTRAMUSCULAR | Status: DC | PRN
  Administered 2024-03-19 (×2): 25 ug via INTRAVENOUS

## 2024-03-19 MED ORDER — POVIDONE-IODINE 10 % EX SWAB: 2.0000 | Freq: Once | CUTANEOUS | Status: DC

## 2024-03-19 MED ORDER — ONDANSETRON HCL 4 MG/2ML IJ SOLN: 4.0000 mg | Freq: Four times a day (QID) | INTRAMUSCULAR | Status: DC | PRN

## 2024-03-19 MED ORDER — LIDOCAINE HCL (PF) 1 % IJ SOLN
INTRAMUSCULAR | Status: DC | PRN
  Administered 2024-03-19: 30 mL

## 2024-03-19 MED ORDER — CEFAZOLIN SODIUM-DEXTROSE 2-4 GM/100ML-% IV SOLN
INTRAVENOUS | Status: AC
  Administered 2024-03-19: 2 g via INTRAVENOUS
  Filled 2024-03-19: qty 100

## 2024-03-19 MED ORDER — SODIUM CHLORIDE 0.9% FLUSH: 3.0000 mL | INTRAVENOUS | Status: DC | PRN

## 2024-03-19 MED ORDER — FENTANYL CITRATE (PF) 100 MCG/2ML IJ SOLN
INTRAMUSCULAR | Status: AC
  Filled 2024-03-19: qty 2

## 2024-03-19 MED ORDER — SODIUM CHLORIDE 0.9 % IV SOLN
INTRAVENOUS | Status: AC
  Administered 2024-03-19: 80 mg
  Filled 2024-03-19: qty 2

## 2024-03-19 MED ORDER — SODIUM CHLORIDE 0.9 % IV SOLN: INTRAVENOUS | Status: DC

## 2024-03-19 MED ORDER — MIDAZOLAM HCL (PF) 2 MG/2ML IJ SOLN
INTRAMUSCULAR | Status: DC | PRN
  Administered 2024-03-19 (×2): 1 mg via INTRAVENOUS

## 2024-03-19 MED ORDER — MIDAZOLAM HCL 2 MG/2ML IJ SOLN
INTRAMUSCULAR | Status: AC
  Filled 2024-03-19: qty 2

## 2024-03-19 MED ORDER — HEPARIN (PORCINE) IN NACL 1000-0.9 UT/500ML-% IV SOLN
INTRAVENOUS | Status: DC | PRN
  Administered 2024-03-19: 500 mL

## 2024-03-19 MED ORDER — LIDOCAINE HCL (PF) 1 % IJ SOLN
INTRAMUSCULAR | Status: AC
  Filled 2024-03-19: qty 60

## 2024-03-19 MED ORDER — CEFAZOLIN SODIUM-DEXTROSE 2-4 GM/100ML-% IV SOLN: 2.0000 g | INTRAVENOUS | Status: AC

## 2024-03-19 MED ORDER — CHLORHEXIDINE GLUCONATE 4 % EX SOLN: 4.0000 | Freq: Once | CUTANEOUS | Status: DC

## 2024-03-19 MED ORDER — SODIUM CHLORIDE 0.9 % IV SOLN: 80.0000 mg | INTRAVENOUS | Status: AC

## 2024-03-19 NOTE — H&P (Signed)
" °  Electrophysiology Office Note:   Date:  03/19/2024  ID:  [name], DOB [birth year], MRN [id number]  Primary Cardiologist: Dorn Lesches, MD Primary Heart Failure: None Electrophysiologist: Rufina Kimery [name], MD      History of Present Illness:   [name] is a 84 y.o. female with h/o elevated coronary calcium  score, SVT, PACs, PVCs, hypertension, hyperlipidemia seen today for  for Electrophysiology evaluation of bradycardia at the request of Damien Braver.    Today, denies symptoms of palpitations, chest pain, dyspnea, orthopnea, PND, lower extremity edema, claudication, dizziness, presyncope, syncope, bleeding, or neurologic sequela. The patient is tolerating medications without difficulties. Plan pacemaker implant today.   EP Information / Studies Reviewed:    EKG is not ordered today. EKG from 11/07/2023 reviewed which showed sinus rhythm, rate 49        Risk Assessment/Calculations:            Physical Exam:   VS:  BP (!) 143/85   Pulse 84   Temp 97.6 F (36.4 C) (Oral)   Resp 17   Ht 5' 3 (1.6 m)   Wt 60.3 kg   SpO2 99%   BMI 23.56 kg/m    Wt Readings from Last 3 Encounters:  03/19/24 60.3 kg  01/29/24 61.7 kg  12/21/23 62.5 kg    GEN: Well nourished, well developed in no acute distress NECK: No JVD; No carotid bruits CARDIAC: Regular rate and rhythm, no murmurs, rubs, gallops RESPIRATORY:  Clear to auscultation without rales, wheezing or rhonchi  ABDOMEN: Soft, non-tender, non-distended EXTREMITIES:  No edema; No deformity     ASSESSMENT AND PLAN:    1.  Sick sinus syndrome: [name] has presented today for surgery, with the diagnosis of sick sinus.  The various methods of treatment have been discussed with the patient and family. After consideration of risks, benefits and other options for treatment, the patient has consented to  Procedure(s): pacemaker implant as a surgical intervention .  Risks include but not limited to bleeding,  infection, pneumothorax, perforation, tamponade, vascular damage, renal failure, MI, stroke, death, and lead dislodgement . The patient's history has been reviewed, patient examined, no change in status, stable for surgery.  I have reviewed the patient's chart and labs.  Questions were answered to the patient's satisfaction.    Cerise Lieber Norton, MD 03/19/2024 12:16 PM  "

## 2024-03-19 NOTE — Discharge Instructions (Addendum)
 After Your Pacemaker   You have a Abbott Pacemaker  If you have a Medtronic or Biotronik device, plug in your home monitor once you get home, and no manual interaction is required.   If you have an Abbott or Autozone device, plug your home monitor once you get home, sit near the device, and press the large activation button. Sit nearby until the process is complete, usually notated by lights on the monitor.   If you were set up for monitoring using an app on your phone, make sure the app remains open in the background and the Bluetooth remains on.  ACTIVITY Do not lift your arm above shoulder height for 1 week after your procedure. After 7 days, you may progress as below.  You should remove your sling 24 hours after your procedure, unless otherwise instructed by your provider.     Tuesday March 26, 2024  Wednesday March 27, 2024 Thursday March 28, 2024 Friday March 29, 2024   Do not lift, push, pull, or carry anything over 10 pounds with the affected arm until 6 weeks (Tuesday April 30, 2024 ) after your procedure.   You may drive AFTER your wound check, unless you have been told otherwise by your provider.   Ask your healthcare provider when you can go back to work   INCISION/Dressing  If large square, outer bandage is left in place, this can be removed after 24 hours from your procedure. Do not remove steri-strips or glue as below.   If a PRESSURE DRESSING (a bulky dressing that usually goes up over your shoulder) was applied or left in place, please follow instructions given by your provider on when to return to have this removed.   Monitor your Pacemaker site for redness, swelling, and drainage. Call the device clinic at [phone] if you experience these symptoms or fever/chills.  If your incision is sealed with Steri-strips or staples, you may shower 7 days after your procedure or when told by your provider. Do not remove the steri-strips or let the shower  hit directly on your site. You may wash around your site with soap and water.    If you were discharged in a sling, please do not wear this during the day more than 48 hours after your surgery unless otherwise instructed. This may increase the risk of stiffness and soreness in your shoulder.   Avoid lotions, ointments, or perfumes over your incision until it is well-healed.  You may use a hot tub or a pool AFTER your wound check appointment if the incision is completely closed.  Pacemaker Alerts:  Some alerts are vibratory and others beep. These are NOT emergencies. Please call our office to let us  know. If this occurs at night or on weekends, it can wait until the next business day. Send a remote transmission.  If your device is capable of reading fluid status (for heart failure), you will be offered monthly monitoring to review this with you.   DEVICE MANAGEMENT Remote monitoring is used to monitor your pacemaker from home. This monitoring is scheduled every 91 days by our office. It allows us  to keep an eye on the functioning of your device to ensure it is working properly. You will routinely see your Electrophysiologist annually (more often if necessary).  This will appear as a REMOTE check on your MyChart schedule. These are automatic and there is nothing for you to manually do unless otherwise instructed.  You should receive your ID card for your  new device in 4-8 weeks. Keep this card with you at all times once received. Consider wearing a medical alert bracelet or necklace.  Your Pacemaker may be MRI compatible. This will be discussed at your next office visit/wound check.  You should avoid contact with strong electric or magnetic fields.   Do not use amateur (ham) radio equipment or electric (arc) welding torches. MP3 player headphones with magnets should not be used. Some devices are safe to use if held at least 12 inches (30 cm) from your Pacemaker. These include power tools, lawn  mowers, and speakers. If you are unsure if something is safe to use, ask your health care provider.  When using your cell phone, hold it to the ear that is on the opposite side from the Pacemaker. Do not leave your cell phone in a pocket over the Pacemaker.  You may safely use electric blankets, heating pads, computers, and microwave ovens.  Call the office right away if: You have chest pain. You feel more short of breath than you have felt before. You feel more light-headed than you have felt before. Your incision starts to open up.  This information is not intended to replace advice given to you by your health care provider. Make sure you discuss any questions you have with your health care provider.

## 2024-03-19 NOTE — Progress Notes (Addendum)
 Discharge instructions reviewed with patient and son at bedside. Denies questions or concerns. PT tolerated PO intake. Seen by MD and Device rep. EKG and CXR performed and resulted  Incision site remains clean dry and intact. No s/s of complications. PT escorted from the unit via wheel chair to personal vehicle.

## 2024-03-22 ENCOUNTER — Telehealth: Payer: Self-pay | Admitting: Cardiology

## 2024-03-22 NOTE — Telephone Encounter (Signed)
 Pt c/o medication issue:   1. Name of Medication:   metoprolol  succinate (TOPROL  XL) 25 MG 24 hr tablet     2. How are you currently taking this medication (dosage and times per day)? Take 0.5 tablets (12.5 mg total) by mouth daily.Patient taking differently: Take 12.5 mg by mouth daily as needed (Heart palpitations).    3. Are you having a reaction (difficulty breathing--STAT)? No   4. What is your medication issue? Pt is stating feeling comfortable since her procedure and wants to know if she should take this medication     Please advise

## 2024-03-22 NOTE — Telephone Encounter (Signed)
 Spoke with the patient who states she had some palpitations this morning and wanted to know if it was still okay to take her as needed metoprolol . Advised this was fine. She states that other than that she has been doing good since her procedure.

## 2024-04-02 ENCOUNTER — Ambulatory Visit

## 2024-04-02 ENCOUNTER — Ambulatory Visit: Payer: Self-pay | Admitting: Cardiology

## 2024-04-02 DIAGNOSIS — I495 Sick sinus syndrome: Secondary | ICD-10-CM | POA: Insufficient documentation

## 2024-04-02 LAB — CUP PACEART INCLINIC DEVICE CHECK
Battery Remaining Longevity: 123 mo
Battery Voltage: 3.11 V
Brady Statistic RA Percent Paced: 65 %
Brady Statistic RV Percent Paced: 0.93 %
Date Time Interrogation Session: 20260120125652
Implantable Lead Connection Status: 753985
Implantable Lead Connection Status: 753985
Implantable Lead Implant Date: 20260106
Implantable Lead Implant Date: 20260106
Implantable Lead Location: 753859
Implantable Lead Location: 753860
Implantable Pulse Generator Implant Date: 20260106
Lead Channel Impedance Value: 425 Ohm
Lead Channel Impedance Value: 475 Ohm
Lead Channel Pacing Threshold Amplitude: 0.625 V
Lead Channel Pacing Threshold Amplitude: 0.875 V
Lead Channel Pacing Threshold Pulse Width: 0.5 ms
Lead Channel Pacing Threshold Pulse Width: 0.5 ms
Lead Channel Sensing Intrinsic Amplitude: 12 mV
Lead Channel Sensing Intrinsic Amplitude: 2.2 mV
Lead Channel Setting Pacing Amplitude: 1.125
Lead Channel Setting Pacing Amplitude: 1.625
Lead Channel Setting Pacing Pulse Width: 0.5 ms
Lead Channel Setting Sensing Sensitivity: 2 mV
Pulse Gen Model: 2272
Pulse Gen Serial Number: 8345852

## 2024-04-02 NOTE — Progress Notes (Signed)
 Normal dual chamber pacemaker wound check. Presenting rhythm: AP/VS 63. Wound well healed. Routine testing performed. Thresholds, sensing, and impedance consistent with implant measurements. No episodes. Reviewed arm restrictions to continue for 6 weeks total post op. Pt enrolled in remote follow-up.

## 2024-04-02 NOTE — Patient Instructions (Signed)
" °  After Your Pacemaker   Monitor your pacemaker site for redness, swelling, and drainage. Call the device clinic at [phone] if you experience these symptoms or fever/chills.  Your incision was closed with Steri-strips or staples:  You may shower 7 days after your procedure and wash your incision with soap and water. Avoid lotions, ointments, or perfumes over your incision until it is well-healed.  You may use a hot tub or a pool after your wound check appointment if the incision is completely closed.  Do not lift, push or pull greater than 10 pounds with the affected arm until FEBRUARY 17th. There are no other restrictions in arm movement after your wound check appointment.  You may drive, unless driving has been restricted by your healthcare providers.  Remote monitoring is used to monitor your pacemaker from home. This monitoring is scheduled every 91 days by our office. It allows us  to keep an eye on the functioning of your device to ensure it is working properly. You will routinely see your Electrophysiologist annually (more often if necessary).  "

## 2024-04-05 ENCOUNTER — Telehealth: Payer: Self-pay | Admitting: Internal Medicine

## 2024-04-05 NOTE — Telephone Encounter (Signed)
 Received call regarding a small opening described as pinpoint at the skin edge and at the corner of pacemaker incision site that was placed March 19, 2024. Last evaluated this past Tuesday and was well healing in device clinic. Denies infectious symptoms. Site is without new erythema or swelling.  I informed [name] that I would like her to return to device clinic to have her wound re-evaluated. I also gave her informed her to contact us  again if she develops new swelling, erythema, or if the size increases. Will leave a message for day team to schedule her for follow up.

## 2024-04-06 NOTE — Telephone Encounter (Signed)
 I messaged scheduling for sooner follow up to device clinic.   Signed,  Lorette CINDERELLA Kapur, PA-C 04/06/2024, 5:33 PM

## 2024-04-09 NOTE — Telephone Encounter (Signed)
 Can you please call patient and have put onto device clinic schedule for next opening.

## 2024-04-10 NOTE — Telephone Encounter (Signed)
 Spoke w/ patient - she is scheduled to see the Device Clinic tomorrow 1/29!

## 2024-04-11 ENCOUNTER — Ambulatory Visit

## 2024-04-11 DIAGNOSIS — R002 Palpitations: Secondary | ICD-10-CM

## 2024-04-11 NOTE — Progress Notes (Signed)
 Patient seen in device clinic for wound check. Scab noted at distal end of incision. No redness, swelling, warmth or signs of infection noted. Patient advised to continue to wash incision with warm soapy water, clean wash cloth and pat dry. Refrain from any lotions, ointments or creams until completely healed. Patient advised if any s/s of infections such as redness, swelling, drainage, fever or chills arise, call the device clinic at [phone]. Patient voiced understanding and agreeable to plan. Bandaid placed over scab area to protect from clothing or other items from rubbing possibly causing irritation or removal.   2 week f/u wound check scheduled.

## 2024-04-11 NOTE — Patient Instructions (Signed)
 ? ?  After Your Pacemaker ? ? ?Monitor your pacemaker site for redness, swelling, and drainage. Call the device clinic at [phone] if you experience these symptoms or fever/chills. ? ?

## 2024-04-25 ENCOUNTER — Ambulatory Visit

## 2024-04-30 ENCOUNTER — Ambulatory Visit

## 2024-06-19 ENCOUNTER — Ambulatory Visit: Admitting: Cardiology

## 2024-07-30 ENCOUNTER — Ambulatory Visit

## 2024-08-01 ENCOUNTER — Ambulatory Visit: Admitting: Nurse Practitioner

## 2024-10-29 ENCOUNTER — Ambulatory Visit

## 2025-01-28 ENCOUNTER — Ambulatory Visit

## 2025-04-29 ENCOUNTER — Ambulatory Visit
